# Patient Record
Sex: Male | Born: 1945 | Race: Black or African American | Hispanic: No | Marital: Married | Smoking: Former smoker
Health system: Southern US, Community
[De-identification: ages and names within clinical notes are randomized; demographics above are authoritative.]

## PROBLEM LIST (undated history)

## (undated) DIAGNOSIS — I10 Essential (primary) hypertension: Secondary | ICD-10-CM

## (undated) DIAGNOSIS — K625 Hemorrhage of anus and rectum: Secondary | ICD-10-CM

## (undated) DIAGNOSIS — J449 Chronic obstructive pulmonary disease, unspecified: Secondary | ICD-10-CM

## (undated) DIAGNOSIS — G4733 Obstructive sleep apnea (adult) (pediatric): Principal | ICD-10-CM

## (undated) DIAGNOSIS — Z860100 Personal history of colon polyps, unspecified: Secondary | ICD-10-CM

## (undated) DIAGNOSIS — E7800 Pure hypercholesterolemia, unspecified: Secondary | ICD-10-CM

## (undated) DIAGNOSIS — K219 Gastro-esophageal reflux disease without esophagitis: Secondary | ICD-10-CM

## (undated) DIAGNOSIS — G4730 Sleep apnea, unspecified: Secondary | ICD-10-CM

## (undated) DIAGNOSIS — R600 Localized edema: Secondary | ICD-10-CM

## (undated) DIAGNOSIS — I219 Acute myocardial infarction, unspecified: Secondary | ICD-10-CM

## (undated) DIAGNOSIS — E559 Vitamin D deficiency, unspecified: Secondary | ICD-10-CM

## (undated) DIAGNOSIS — K648 Other hemorrhoids: Secondary | ICD-10-CM

## (undated) DIAGNOSIS — I5030 Unspecified diastolic (congestive) heart failure: Secondary | ICD-10-CM

## (undated) DIAGNOSIS — M1990 Unspecified osteoarthritis, unspecified site: Secondary | ICD-10-CM

## (undated) HISTORY — DX: Obstructive sleep apnea (adult) (pediatric): G47.33

## (undated) HISTORY — DX: Pure hypercholesterolemia, unspecified: E78.00

## (undated) HISTORY — DX: Other hemorrhoids: K64.8

## (undated) HISTORY — PX: US ECHOCARDIOGRAPHY: HXRAD669

## (undated) HISTORY — DX: Essential (primary) hypertension: I10

## (undated) HISTORY — DX: Personal history of colonic polyps: Z86.010

## (undated) HISTORY — DX: Gastro-esophageal reflux disease without esophagitis: K21.9

## (undated) HISTORY — DX: Chronic obstructive pulmonary disease, unspecified: J44.9

## (undated) HISTORY — DX: Vitamin D deficiency, unspecified: E55.9

## (undated) HISTORY — DX: Personal history of colon polyps, unspecified: Z86.0100

## (undated) HISTORY — DX: Sleep apnea, unspecified: G47.30

## (undated) HISTORY — DX: Hemorrhage of anus and rectum: K62.5

## (undated) HISTORY — PX: TONSILLECTOMY: SUR1361

## (undated) HISTORY — DX: Localized edema: R60.0

## (undated) HISTORY — DX: Unspecified diastolic (congestive) heart failure: I50.30

## (undated) HISTORY — PX: COLONOSCOPY: SHX174

---

## 2001-08-18 ENCOUNTER — Emergency Department (HOSPITAL_COMMUNITY): Admission: EM | Admit: 2001-08-18 | Discharge: 2001-08-18 | Payer: Self-pay | Admitting: Emergency Medicine

## 2001-08-18 ENCOUNTER — Encounter: Payer: Self-pay | Admitting: Emergency Medicine

## 2002-03-09 DIAGNOSIS — I219 Acute myocardial infarction, unspecified: Secondary | ICD-10-CM

## 2002-03-09 HISTORY — DX: Acute myocardial infarction, unspecified: I21.9

## 2002-03-09 HISTORY — PX: CARDIAC CATHETERIZATION: SHX172

## 2002-05-17 ENCOUNTER — Encounter: Payer: Self-pay | Admitting: Pulmonary Disease

## 2002-05-17 ENCOUNTER — Ambulatory Visit (HOSPITAL_COMMUNITY): Admission: RE | Admit: 2002-05-17 | Discharge: 2002-05-17 | Payer: Self-pay | Admitting: Pulmonary Disease

## 2003-01-08 ENCOUNTER — Encounter (HOSPITAL_COMMUNITY): Admission: RE | Admit: 2003-01-08 | Discharge: 2003-04-08 | Payer: Self-pay | Admitting: Pulmonary Disease

## 2003-04-09 ENCOUNTER — Encounter (HOSPITAL_COMMUNITY): Admission: RE | Admit: 2003-04-09 | Discharge: 2003-04-16 | Payer: Self-pay | Admitting: Pulmonary Disease

## 2003-04-17 ENCOUNTER — Encounter (HOSPITAL_COMMUNITY): Admission: RE | Admit: 2003-04-17 | Discharge: 2003-07-16 | Payer: Self-pay | Admitting: Interventional Cardiology

## 2003-08-08 ENCOUNTER — Encounter (HOSPITAL_COMMUNITY): Admission: RE | Admit: 2003-08-08 | Discharge: 2003-11-06 | Payer: Self-pay | Admitting: Interventional Cardiology

## 2004-12-19 ENCOUNTER — Emergency Department (HOSPITAL_COMMUNITY): Admission: EM | Admit: 2004-12-19 | Discharge: 2004-12-20 | Payer: Self-pay | Admitting: Emergency Medicine

## 2004-12-24 ENCOUNTER — Ambulatory Visit (HOSPITAL_COMMUNITY): Admission: RE | Admit: 2004-12-24 | Discharge: 2004-12-24 | Payer: Self-pay | Admitting: Pulmonary Disease

## 2005-12-31 ENCOUNTER — Emergency Department (HOSPITAL_COMMUNITY): Admission: EM | Admit: 2005-12-31 | Discharge: 2005-12-31 | Payer: Self-pay | Admitting: Family Medicine

## 2007-12-30 ENCOUNTER — Ambulatory Visit (HOSPITAL_COMMUNITY): Admission: RE | Admit: 2007-12-30 | Discharge: 2007-12-30 | Payer: Self-pay | Admitting: Pulmonary Disease

## 2009-03-09 HISTORY — PX: CARDIOVASCULAR STRESS TEST: SHX262

## 2009-09-30 ENCOUNTER — Emergency Department (HOSPITAL_COMMUNITY): Admission: EM | Admit: 2009-09-30 | Discharge: 2009-09-30 | Payer: Self-pay | Admitting: Emergency Medicine

## 2009-10-01 ENCOUNTER — Emergency Department (HOSPITAL_COMMUNITY): Admission: EM | Admit: 2009-10-01 | Discharge: 2009-10-01 | Payer: Self-pay | Admitting: Emergency Medicine

## 2009-10-04 ENCOUNTER — Emergency Department (HOSPITAL_COMMUNITY): Admission: EM | Admit: 2009-10-04 | Discharge: 2009-10-04 | Payer: Self-pay | Admitting: Family Medicine

## 2009-10-09 ENCOUNTER — Emergency Department (HOSPITAL_COMMUNITY): Admission: EM | Admit: 2009-10-09 | Discharge: 2009-10-09 | Payer: Self-pay | Admitting: Family Medicine

## 2009-10-22 ENCOUNTER — Encounter (HOSPITAL_BASED_OUTPATIENT_CLINIC_OR_DEPARTMENT_OTHER): Admission: RE | Admit: 2009-10-22 | Discharge: 2009-11-07 | Payer: Self-pay | Admitting: General Surgery

## 2010-03-30 ENCOUNTER — Encounter: Payer: Self-pay | Admitting: Pulmonary Disease

## 2010-05-30 ENCOUNTER — Inpatient Hospital Stay (INDEPENDENT_AMBULATORY_CARE_PROVIDER_SITE_OTHER)
Admission: RE | Admit: 2010-05-30 | Discharge: 2010-05-30 | Disposition: A | Discharge status: Home / Self Care | Payer: 59 | Source: Ambulatory Visit | Attending: Family Medicine | Admitting: Family Medicine

## 2010-05-30 ENCOUNTER — Ambulatory Visit (INDEPENDENT_AMBULATORY_CARE_PROVIDER_SITE_OTHER): Payer: 59

## 2010-05-30 DIAGNOSIS — J40 Bronchitis, not specified as acute or chronic: Secondary | ICD-10-CM

## 2011-02-10 ENCOUNTER — Encounter (HOSPITAL_COMMUNITY): Payer: Self-pay | Admitting: Pharmacy Technician

## 2011-02-10 ENCOUNTER — Other Ambulatory Visit: Payer: Self-pay | Admitting: Otolaryngology

## 2011-02-13 ENCOUNTER — Encounter (HOSPITAL_COMMUNITY): Payer: Self-pay

## 2011-02-13 ENCOUNTER — Encounter (HOSPITAL_COMMUNITY)
Admission: RE | Admit: 2011-02-13 | Discharge: 2011-02-13 | Disposition: A | Discharge status: Home / Self Care | Payer: 59 | Source: Ambulatory Visit | Attending: Otolaryngology | Admitting: Otolaryngology

## 2011-02-13 ENCOUNTER — Ambulatory Visit (HOSPITAL_COMMUNITY)
Admission: RE | Admit: 2011-02-13 | Discharge: 2011-02-13 | Disposition: A | Discharge status: Home / Self Care | Payer: 59 | Source: Ambulatory Visit | Attending: Anesthesiology | Admitting: Anesthesiology

## 2011-02-13 ENCOUNTER — Other Ambulatory Visit: Payer: Self-pay

## 2011-02-13 DIAGNOSIS — Z01810 Encounter for preprocedural cardiovascular examination: Secondary | ICD-10-CM | POA: Insufficient documentation

## 2011-02-13 DIAGNOSIS — Z01818 Encounter for other preprocedural examination: Secondary | ICD-10-CM | POA: Insufficient documentation

## 2011-02-13 DIAGNOSIS — Z01812 Encounter for preprocedural laboratory examination: Secondary | ICD-10-CM | POA: Insufficient documentation

## 2011-02-13 HISTORY — DX: Unspecified osteoarthritis, unspecified site: M19.90

## 2011-02-13 HISTORY — DX: Essential (primary) hypertension: I10

## 2011-02-13 HISTORY — DX: Acute myocardial infarction, unspecified: I21.9

## 2011-02-13 LAB — BASIC METABOLIC PANEL
BUN: 16 mg/dL (ref 6–23)
CO2: 27 mEq/L (ref 19–32)
Calcium: 9.8 mg/dL (ref 8.4–10.5)
Chloride: 99 mEq/L (ref 96–112)
Creatinine, Ser: 0.81 mg/dL (ref 0.50–1.35)
GFR calc Af Amer: >90 mL/min (ref >90)
GFR calc non Af Amer: >90 mL/min (ref >90)
Glucose, Bld: 108 mg/dL — ABNORMAL HIGH (ref 70–99)
Potassium: 4.6 mEq/L (ref 3.5–5.1)
Sodium: 136 mEq/L (ref 135–145)

## 2011-02-13 LAB — CBC
HCT: 41.6 % (ref 39.0–52.0)
Hemoglobin: 14.7 g/dL (ref 13.0–17.0)
MCH: 31.7 pg (ref 26.0–34.0)
MCHC: 35.3 g/dL (ref 30.0–36.0)
MCV: 89.8 fL (ref 78.0–100.0)
Platelets: 231 10*3/uL (ref 150–400)
RBC: 4.63 MIL/uL (ref 4.22–5.81)
RDW: 13.6 % (ref 11.5–15.5)
WBC: 5.6 10*3/uL (ref 4.0–10.5)

## 2011-02-13 LAB — MRSA PCR SCREENING: MRSA by PCR: NEGATIVE

## 2011-02-13 NOTE — Progress Notes (Signed)
Pt sees Dr. Katrinka Blazing at [facility] cardiology annually, last stress test 2011, ECHO approximately 3 years ago. Requested records from office.

## 2011-02-13 NOTE — Pre-Procedure Instructions (Signed)
20 Richard [name]  02/13/2011   Your procedure is scheduled on:  December 11  Report to [facility] at 9:45 AM.  Call this number if you have problems the morning of surgery: [phone]   Remember:   Do not eat food:After Midnight.  May have clear liquids: up to 4 Hours before arrival.  Clear liquids include soda, tea, black coffee, apple or grape juice, broth.  Take these medicines the morning of surgery with A SIP OF WATER: Coreg, Singulair, nasal sprays, inhalers (bring albuterol to surgery)   Do not wear jewelry, make-up or nail polish.  Do not wear lotions, powders, or perfumes. You may wear deodorant.  Do not shave 48 hours prior to surgery.  Do not bring valuables to the hospital.  Contacts, dentures or bridgework may not be worn into surgery.  Leave suitcase in the car. After surgery it may be brought to your room.  For patients admitted to the hospital, checkout time is 11:00 AM the day of discharge.   Patients discharged the day of surgery will not be allowed to drive home.  Name and phone number of your driver: NA   Special Instructions: CHG Shower Use Special Wash: 1/2 bottle night before surgery and 1/2 bottle morning of surgery.   Please read over the following fact sheets that you were given: Pain Booklet, Coughing and Deep Breathing and Surgical Site Infection Prevention

## 2011-02-16 NOTE — Consult Note (Signed)
Anesthesia:  65 year old male for endoscopic sinus surgery for nasal polyps on 02/17/11.  His history includes CAD s/p ACS accompanied by vfib s/p CX stents X 2 in 2004, HTN, HLD, asthma, OA, and former smoker.  His Cardiologist is Dr. Katrinka Blazing who did clear him for this procedure.  His last stress test was on 05/22/09 showing small fixed inferior-basal defect.  EF 60%.  This was not felt significantly changed from his 2006 study.  He has been asymptomatic.  His preoperative EKG was noted.  RSR prime is new.  Labs and CXR noted.  Plan to proceed.

## 2011-02-17 ENCOUNTER — Encounter (HOSPITAL_COMMUNITY): Payer: Self-pay | Admitting: *Deleted

## 2011-02-17 ENCOUNTER — Encounter (HOSPITAL_COMMUNITY): Payer: Self-pay | Admitting: Vascular Surgery

## 2011-02-17 ENCOUNTER — Other Ambulatory Visit: Payer: Self-pay | Admitting: Otolaryngology

## 2011-02-17 ENCOUNTER — Ambulatory Visit (HOSPITAL_COMMUNITY)
Admission: RE | Admit: 2011-02-17 | Discharge: 2011-02-17 | Disposition: A | Discharge status: Home / Self Care | Payer: 59 | Source: Ambulatory Visit | Attending: Otolaryngology | Admitting: Otolaryngology

## 2011-02-17 ENCOUNTER — Ambulatory Visit (HOSPITAL_COMMUNITY): Payer: 59 | Admitting: Vascular Surgery

## 2011-02-17 ENCOUNTER — Encounter (HOSPITAL_COMMUNITY)
Admission: RE | Disposition: A | Discharge status: Home / Self Care | Payer: Self-pay | Source: Ambulatory Visit | Attending: Otolaryngology

## 2011-02-17 DIAGNOSIS — I10 Essential (primary) hypertension: Secondary | ICD-10-CM | POA: Insufficient documentation

## 2011-02-17 DIAGNOSIS — I2510 Atherosclerotic heart disease of native coronary artery without angina pectoris: Secondary | ICD-10-CM | POA: Insufficient documentation

## 2011-02-17 DIAGNOSIS — J329 Chronic sinusitis, unspecified: Secondary | ICD-10-CM | POA: Insufficient documentation

## 2011-02-17 DIAGNOSIS — J45909 Unspecified asthma, uncomplicated: Secondary | ICD-10-CM | POA: Insufficient documentation

## 2011-02-17 DIAGNOSIS — J338 Other polyp of sinus: Secondary | ICD-10-CM | POA: Insufficient documentation

## 2011-02-17 DIAGNOSIS — I252 Old myocardial infarction: Secondary | ICD-10-CM | POA: Insufficient documentation

## 2011-02-17 DIAGNOSIS — J331 Polypoid sinus degeneration: Secondary | ICD-10-CM

## 2011-02-17 HISTORY — PX: POLYPECTOMY: SHX149

## 2011-02-17 HISTORY — DX: Polypoid sinus degeneration: J33.1

## 2011-02-17 HISTORY — PX: SINUS ENDO W/FUSION: SHX777

## 2011-02-17 SURGERY — SINUS SURGERY, ENDOSCOPIC, USING COMPUTER-ASSISTED NAVIGATION
Anesthesia: General | Wound class: Clean Contaminated

## 2011-02-17 MED ORDER — MEPERIDINE HCL 25 MG/ML IJ SOLN: 6.2500 mg | INTRAMUSCULAR | Status: DC | PRN

## 2011-02-17 MED ORDER — SODIUM CHLORIDE 0.9 % IR SOLN
Status: DC | PRN
  Administered 2011-02-17: 1000 mL

## 2011-02-17 MED ORDER — ROCURONIUM BROMIDE 100 MG/10ML IV SOLN
INTRAVENOUS | Status: DC | PRN
  Administered 2011-02-17: 50 mg via INTRAVENOUS

## 2011-02-17 MED ORDER — OXYMETAZOLINE HCL 0.05 % NA SOLN
NASAL | Status: DC | PRN
  Administered 2011-02-17: 1

## 2011-02-17 MED ORDER — HYDROCODONE-ACETAMINOPHEN 5-325 MG PO TABS: 1.0000 | ORAL_TABLET | Freq: Four times a day (QID) | ORAL | Status: AC | PRN

## 2011-02-17 MED ORDER — ONDANSETRON HCL 4 MG/2ML IJ SOLN
INTRAMUSCULAR | Status: DC | PRN
  Administered 2011-02-17: 4 mg via INTRAVENOUS

## 2011-02-17 MED ORDER — HYDROMORPHONE HCL PF 1 MG/ML IJ SOLN: 0.2500 mg | INTRAMUSCULAR | Status: DC | PRN

## 2011-02-17 MED ORDER — CEFAZOLIN SODIUM 1-5 GM-% IV SOLN
1.0000 g | Freq: Once | INTRAVENOUS | Status: AC
  Administered 2011-02-17: 1 g via INTRAVENOUS

## 2011-02-17 MED ORDER — DEXAMETHASONE SODIUM PHOSPHATE 4 MG/ML IJ SOLN: 10.0000 mg | Freq: Once | INTRAMUSCULAR | Status: DC

## 2011-02-17 MED ORDER — DEXAMETHASONE SODIUM PHOSPHATE 4 MG/ML IJ SOLN
INTRAMUSCULAR | Status: DC | PRN
  Administered 2011-02-17: 10 mg via INTRAVENOUS

## 2011-02-17 MED ORDER — FENTANYL CITRATE 0.05 MG/ML IJ SOLN
INTRAMUSCULAR | Status: DC | PRN
  Administered 2011-02-17: 50 ug via INTRAVENOUS
  Administered 2011-02-17: 100 ug via INTRAVENOUS

## 2011-02-17 MED ORDER — LIDOCAINE-EPINEPHRINE 1 %-1:100000 IJ SOLN
INTRAMUSCULAR | Status: DC | PRN
  Administered 2011-02-17: 6 mL

## 2011-02-17 MED ORDER — AMOXICILLIN-POT CLAVULANATE 500-125 MG PO TABS: 1.0000 | ORAL_TABLET | Freq: Two times a day (BID) | ORAL | Status: AC

## 2011-02-17 MED ORDER — PHENYLEPHRINE HCL 10 MG/ML IJ SOLN
10.0000 mg | INTRAVENOUS | Status: DC | PRN
  Administered 2011-02-17: 20 ug/min via INTRAVENOUS

## 2011-02-17 MED ORDER — PROPOFOL 10 MG/ML IV EMUL
INTRAVENOUS | Status: DC | PRN
  Administered 2011-02-17: 130 mg via INTRAVENOUS

## 2011-02-17 MED ORDER — LIDOCAINE HCL (CARDIAC) 20 MG/ML IV SOLN
INTRAVENOUS | Status: DC | PRN
  Administered 2011-02-17: 50 mg via INTRAVENOUS

## 2011-02-17 MED ORDER — LACTATED RINGERS IV SOLN
INTRAVENOUS | Status: DC | PRN
  Administered 2011-02-17 (×2): via INTRAVENOUS

## 2011-02-17 MED ORDER — GLYCOPYRROLATE 0.2 MG/ML IJ SOLN
INTRAMUSCULAR | Status: DC | PRN
  Administered 2011-02-17: .8 mg via INTRAVENOUS

## 2011-02-17 MED ORDER — NEOSTIGMINE METHYLSULFATE 1 MG/ML IJ SOLN
INTRAMUSCULAR | Status: DC | PRN
  Administered 2011-02-17: 4 mg via INTRAVENOUS

## 2011-02-17 MED ORDER — MUPIROCIN CALCIUM 2 % EX CREA
TOPICAL_CREAM | CUTANEOUS | Status: DC | PRN
  Administered 2011-02-17: 1 via TOPICAL

## 2011-02-17 MED ORDER — TRIAMCINOLONE ACETONIDE 40 MG/ML IJ SUSP
INTRAMUSCULAR | Status: DC | PRN
  Administered 2011-02-17: 200 mg

## 2011-02-17 MED ORDER — PROMETHAZINE HCL 25 MG/ML IJ SOLN: 6.2500 mg | INTRAMUSCULAR | Status: DC | PRN

## 2011-02-17 SURGICAL SUPPLY — 36 items
BLADE RAD40 ROTATE 4M 4 5PK (BLADE) IMPLANT
BLADE RAD60 ROTATE M4 4 5PK (BLADE) IMPLANT
BLADE TRICUT ROTATE M4 4 5PK (BLADE) ×2 IMPLANT
CANISTER SUCTION 2500CC (MISCELLANEOUS) ×4 IMPLANT
CLOTH BEACON ORANGE TIMEOUT ST (SAFETY) ×2 IMPLANT
COAGULATOR SUCT SWTCH 10FR 6 (ELECTROSURGICAL) ×1 IMPLANT
DRESSING NASAL KENNEDY 3.5X.9 (MISCELLANEOUS) IMPLANT
DRESSING TELFA 8X3 (GAUZE/BANDAGES/DRESSINGS) ×1 IMPLANT
DRSG NASAL KENNEDY 3.5X.9 (MISCELLANEOUS)
ELECT REM PT RETURN 9FT ADLT (ELECTROSURGICAL) ×2
ELECTRODE REM PT RTRN 9FT ADLT (ELECTROSURGICAL) IMPLANT
FILTER ARTHROSCOPY CONVERTOR (FILTER) ×4 IMPLANT
GLOVE BIOGEL M 7.0 STRL (GLOVE) ×4 IMPLANT
GLOVE ECLIPSE 6.5 STRL STRAW (GLOVE) ×1 IMPLANT
GOWN STRL NON-REIN LRG LVL3 (GOWN DISPOSABLE) ×4 IMPLANT
KENNEDY SLIM-PROFILE SINUS PACK ×1 IMPLANT
KIT BASIN OR (CUSTOM PROCEDURE TRAY) ×2 IMPLANT
KIT ROOM TURNOVER OR (KITS) ×2 IMPLANT
MARKER SPHERE PSV REFLC NDI (MISCELLANEOUS) ×4 IMPLANT
NS IRRIG 1000ML POUR BTL (IV SOLUTION) ×2 IMPLANT
PAD ARMBOARD 7.5X6 YLW CONV (MISCELLANEOUS) ×4 IMPLANT
PATTIES SURGICAL .5 X3 (DISPOSABLE) ×2 IMPLANT
SPECIMEN JAR SMALL (MISCELLANEOUS) ×4 IMPLANT
SPONGE NEURO XRAY DETECT 1X3 (DISPOSABLE) ×1 IMPLANT
SUT CHROMIC 4 0 G 3 (SUTURE) ×1 IMPLANT
SUT ETHILON 3 0 PS 1 (SUTURE) ×1 IMPLANT
SUT PLAIN 4 0 ~~LOC~~ 1 (SUTURE) ×1 IMPLANT
SWAB COLLECTION DEVICE MRSA (MISCELLANEOUS) IMPLANT
SYR 50ML SLIP (SYRINGE) IMPLANT
TOWEL OR 17X24 6PK STRL BLUE (TOWEL DISPOSABLE) ×2 IMPLANT
TOWEL OR 17X26 10 PK STRL BLUE (TOWEL DISPOSABLE) ×2 IMPLANT
TOWEL OR NON WOVEN STRL DISP B (DISPOSABLE) ×2 IMPLANT
TRAY ENT MC OR (CUSTOM PROCEDURE TRAY) ×2 IMPLANT
TUBE ANAEROBIC SPECIMEN COL (MISCELLANEOUS) IMPLANT
TUBE CONNECTING 12X1/4 (SUCTIONS) ×2 IMPLANT
WATER STERILE IRR 1000ML POUR (IV SOLUTION) ×1 IMPLANT

## 2011-02-17 NOTE — Anesthesia Postprocedure Evaluation (Signed)
  Anesthesia Post-op Note  Patient: Richard [name]  Procedure(s) Performed:  ENDOSCOPIC SINUS SURGERY WITH FUSION NAVIGATION; POLYPECTOMY NASAL  Patient Location: PACU  Anesthesia Type: General  Level of Consciousness: awake, alert  and oriented  Airway and Oxygen Therapy: Patient Spontanous Breathing  Post-op Pain: mild  Post-op Assessment: Post-op Vital signs reviewed, Patient's Cardiovascular Status Stable, Respiratory Function Stable and Patent Airway  Post-op Vital Signs: Reviewed and stable  Complications: No apparent anesthesia complications

## 2011-02-17 NOTE — Anesthesia Preprocedure Evaluation (Addendum)
Anesthesia Evaluation  Patient identified by MRN, date of birth, ID band Patient awake    Reviewed: Allergy & Precautions, H&P , NPO status , Patient's Chart, lab work & pertinent test results, reviewed documented beta blocker date and time   Airway Mallampati: II TM Distance: >3 FB Neck ROM: Full    Dental  (+) Teeth Intact and Dental Advisory Given   Pulmonary asthma ,  clear to auscultation        Cardiovascular hypertension, Pt. on home beta blockers and Pt. on medications + CAD, + Past MI and + Cardiac Stents Regular Normal    Neuro/Psych    GI/Hepatic negative GI ROS, Neg liver ROS,   Endo/Other  Negative Endocrine ROS  Renal/GU negative Renal ROS     Musculoskeletal negative musculoskeletal ROS (+)   Abdominal   Peds  Hematology negative hematology ROS (+)   Anesthesia Other Findings   Reproductive/Obstetrics                          Anesthesia Physical Anesthesia Plan  ASA: III  Anesthesia Plan: General   Post-op Pain Management:    Induction: Intravenous  Airway Management Planned: Oral ETT  Additional Equipment:   Intra-op Plan:   Post-operative Plan: Extubation in OR  Informed Consent: I have reviewed the patients History and Physical, chart, labs and discussed the procedure including the risks, benefits and alternatives for the proposed anesthesia with the patient or authorized representative who has indicated his/her understanding and acceptance.   Dental advisory given  Plan Discussed with: CRNA  Anesthesia Plan Comments:        Anesthesia Quick Evaluation

## 2011-02-17 NOTE — Transfer of Care (Signed)
Immediate Anesthesia Transfer of Care Note  Patient: Richard [name]  Procedure(s) Performed:  ENDOSCOPIC SINUS SURGERY WITH FUSION NAVIGATION; POLYPECTOMY NASAL  Patient Location: PACU  Anesthesia Type: General  Level of Consciousness: alert  and oriented  Airway & Oxygen Therapy: Patient connected to face mask oxygen  Post-op Assessment: Report given to PACU RN and Post -op Vital signs reviewed and stable  Post vital signs: stable  Complications: No apparent anesthesia complications

## 2011-02-17 NOTE — Anesthesia Procedure Notes (Addendum)
Procedure Name: Intubation Date/Time: 02/17/2011 12:08 PM Performed by: Margaree Mackintosh Pre-anesthesia Checklist: Patient identified, Emergency Drugs available, Suction available, Patient being monitored and Timeout performed Patient Re-evaluated:Patient Re-evaluated prior to inductionOxygen Delivery Method: Circle System Utilized Preoxygenation: Pre-oxygenation with 100% oxygen Intubation Type: IV induction Ventilation: Mask ventilation without difficulty and Oral airway inserted - appropriate to patient size   Date/Time: 02/17/2011 12:12 PM Performed by: Margaree Mackintosh Laryngoscope Size: Mac and 3 Grade View: Grade II Tube type: Oral Tube size: 8.0 mm Number of attempts: 1 Airway Equipment and Method: stylet Placement Confirmation: ETT inserted through vocal cords under direct vision,  positive ETCO2 and breath sounds checked- equal and bilateral Secured at: 22 cm Tube secured with: Tape Dental Injury: Teeth and Oropharynx as per pre-operative assessment

## 2011-02-17 NOTE — H&P (Signed)
History and Physical Interval Note:  02/17/2011 11:48 AM  See Office H&P dated 02/10/11, note in pt's chart   Richard [name]  has presented today for surgery, with the diagnosis of Chronic Sinusitis  The various methods of treatment have been discussed with the patient and family. After consideration of risks, benefits and other options for treatment, the patient has consented to  Procedure(s): ENDOSCOPIC SINUS SURGERY WITH FUSION NAVIGATION POLYPECTOMY NASAL as a surgical intervention .  The patients' history has been reviewed, patient examined, no change in status, stable for surgery.  I have reviewed the patients' chart and labs.  Questions were answered to the patient's satisfaction.     Norris Bodley L

## 2011-02-17 NOTE — Brief Op Note (Signed)
02/17/2011  2:27 PM  PATIENT:  Richard [name]  65 y.o. male  PRE-OPERATIVE DIAGNOSIS:  Chronic Sinusitis  POST-OPERATIVE DIAGNOSIS:  Chronic Sinusitis  PROCEDURE:  Procedure(s): ENDOSCOPIC SINUS SURGERY WITH FUSION NAVIGATION POLYPECTOMY NASAL  SURGEON:  Surgeon(s): Barbee Cough  PHYSICIAN ASSISTANT:   ASSISTANTS: none   ANESTHESIA:   general  EBL:  Total I/O In: 1000 [I.V.:1000] Out: 200 [Blood:200]  BLOOD ADMINISTERED:none  DRAINS: none   LOCAL MEDICATIONS USED:  LIDOCAINE 9CC  SPECIMEN:  Source of Specimen:  sinus contents  DISPOSITION OF SPECIMEN:  PATHOLOGY  COUNTS:  YES  TOURNIQUET:  * No tourniquets in log *  DICTATION: .Other Dictation: Dictation Number [id number]  PLAN OF CARE: Admit for overnight observation  PATIENT DISPOSITION:  PACU - hemodynamically stable.   Delay start of Pharmacological VTE agent (>24hrs) due to surgical blood loss or risk of bleeding:  {YES/NO/NOT APPLICABLE:20182

## 2011-02-17 NOTE — Progress Notes (Signed)
Day of Surgery  Subjective: Post sinus surgery.  No pain.  Little bleeding.  Tolerating diet.  No cardiac symptoms.  Objective: Vital signs in last 24 hours: Temp:  [97.4 F (36.3 C)-99 F (37.2 C)] 99 F (37.2 C) (12/11 1556) Pulse Rate:  [67-76] 75  (12/11 1556) Resp:  [10-18] 16  (12/11 1556) BP: (120-142)/(60-92) 142/92 mmHg (12/11 1556) SpO2:  [97 %-100 %] 97 % (12/11 1556)    Intake/Output from previous day:   Intake/Output this shift: Total I/O In: 1400 [I.V.:1400] Out: 200 [Blood:200]  General appearance: alert, cooperative and no distress Nose: Nasal dressing in place with no active bleeding.  Lab Results:  No results found for this basename: WBC:2,HGB:2,HCT:2,PLT:2 in the last 72 hours BMET No results found for this basename: NA:2,K:2,CL:2,CO2:2,GLUCOSE:2,BUN:2,CREATININE:2,CALCIUM:2 in the last 72 hours PT/INR No results found for this basename: LABPROT:2,INR:2 in the last 72 hours ABG No results found for this basename: PHART:2,PCO2:2,PO2:2,HCO3:2 in the last 72 hours  Studies/Results: No results found.  Anti-infectives: Anti-infectives     Start     Dose/Rate Route Frequency Ordered Stop   02/17/11 1200   ceFAZolin (ANCEF) IVPB 1 g/50 mL premix        1 g 100 mL/hr over 30 Minutes Intravenous  Once 02/17/11 1154 02/17/11 1214   02/17/11 0000   amoxicillin-clavulanate (AUGMENTIN) 500-125 MG per tablet        1 tablet Oral 2 times daily 02/17/11 1453 02/27/11 2359          Assessment/Plan: s/p Procedure(s): ENDOSCOPIC SINUS SURGERY WITH FUSION NAVIGATION POLYPECTOMY NASAL Discharge  LOS: 0 days    Ermelinda Eckert D 02/17/2011

## 2011-02-17 NOTE — Preoperative (Signed)
Beta Blockers   Reason not to administer Beta Blockers:Not Applicable. Coreg 0830 02/17/11

## 2011-02-18 NOTE — Op Note (Signed)
NAME:  Richard [name], Richard [name] NO.:  [id number]  MEDICAL RECORD NO.:  [id number]  LOCATION:  [facility]                         FACILITY:  [facility]  PHYSICIAN:  Kinnie Scales. Annalee Genta, M.D.DATE OF BIRTH:  10/02/1945  DATE OF PROCEDURE:  02/17/2011 DATE OF DISCHARGE:  02/17/2011                              OPERATIVE REPORT   PREOPERATIVE DIAGNOSES: 1. Chronic sinusitis. 2. 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  POSTOPERATIVE DIAGNOSES: 1. Chronic sinusitis. 2. 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  INDICATION FOR SURGERY: 1. Chronic sinusitis. 2. 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  SURGICAL PROCEDURE:  Bilateral endoscopic sinus surgery with nasal polypectomy, and intraoperative computer-assisted navigation (Stealth) consisting of bilateral total ethmoidectomy, bilateral maxillary antrostomy with removal of diseased tissue, bilateral nasal frontal recess and exploration, right sphenoidotomy with removal of diseased tissue.  ANESTHESIA:  General endotracheal.  SURGEON:  Kinnie Scales. Annalee Genta, MD  COMPLICATIONS:  None.  ESTIMATED BLOOD LOSS:  Approximately 200 mL.  The patient was transferred from the operating room to the recovery room in stable condition.  FINDINGS:  Massive bilateral nasal polyposis, deviated nasal septum not impinging on sinus cavities and left untreated, polyps resected, bilateral Kenalog/Bactroban slurry instilled.  Bilateral Kennedy sinus packs placed.  BRIEF HISTORY:  The patient is a 65 year old black male, referred to our office with history of progressive symptoms of nasal airway obstruction. No significant prior nasal surgery.  Examination in the office revealed polypoid disease and complete bilateral nasal airway obstruction.  He has a history of recurrent sinusitis, chronic postnasal discharge, and sinus obstruction.  The patient was treated with antibiotics and oral and topical steroids.   A CT scan of the sinuses was obtained, and the patient had complete opacification of the frontal, maxillary, ethmoid and sphenoid sinuses with complete obstruction of the nasal airway.  He also was found to have a deviated nasal septum.  Given the patient's history, examination, and physical findings, with failure to respond to appropriate medical therapy, I recommended that we consider him for bilateral endoscopic sinus surgery and nasal polypectomy.  The risks, benefits, and possible complications of the procedures were discussed in detail with the patient who understood and concurred with our plan for surgery.  Prior to surgery, a Stealth formatted CT scan of the sinus was performed for use during surgery for intraoperative computer-assisted navigation.  The patient was cleared by his cardiologist, Dr. Verdis Prime from a cardiac standpoint.  The surgery was scheduled at [facility].  PROCEDURE:  The patient was brought to the operating room on February 17, 2011, and placed in supine position on the operating table.  General endotracheal anesthesia was established without difficulty.  When the patient was adequately anesthetized, his nose was injected with a total of 9 mL of 1% lidocaine and 1:100, 000 dilution epinephrine.  The patient's nose was then packed with Afrin-soaked cotton pledgets, which were left in place for approximately 10 minutes.  The patient's Stealth navigation head gear was applied and anatomic and surgical landmarks were identified and confirmed.  The Stealth device was used throughout the surgical procedure.  He was then positioned on the operating table, and prepped and draped in a  sterile fashion, and the surgical procedure was begun.  Began on the left-hand side, the patient's nasal cavity was inspected. He had massive nasal polyposis with complete obstruction of the nasal airway.  Using a 0 degree telescope and a straight microdebrider,  polyps were resected from within the nasal cavity to the level of the middle meatus.  There was significant posterior extension of polypoid disease with obstruction of the nasopharynx.  This was completely cleared of polyps.  The middle meatus was then inspected and dissection was carried out through the anterior ethmoid region using a 0 degree telescope, microdebrider and the Stealth navigation tool.  Dissection was then carried from anterior to posterior removing bony septations and heavy polypoid disease within the entire ethmoid sinus.  The posterior ethmoid region was identified.  All polyps were cleared.  The anterior rostrum of the sphenoid sinus was identified.  Sphenoid ostium was entered and polypoid disease within the sphenoid sinus was resected.  Attention was then turned to the lateral nasal wall.  Using a 45-degree telescope and a curved microdebrider, the uncinate process was resected and the natural os of the maxillary sinus was identified, this was completely occluded with polyps, which were resected using a curved microdebrider and creating a widely patent maxillary sinus.  Attention was then returned to the roof of the ethmoid sinus.  Dissection was carried from posterior to anterior.  The nasal frontal recess was identified and cleared of polypoid disease.  Dissection was then carried up into the frontal sinus to create a widely patent nasal frontal recess.  Attention was then turned to the patient's right-hand side.  He was found to have a large inferior bony septal spur, which was occluding the nasal airway to a degree but did not impinge on the sinuses and was left untreated.  Using a 0 degree telescope, polypoid disease within the nasal cavity middle meatus was resected with a straight microdebrider. The middle turbinate was carefully medialized and resection was then carried out throughout the ethmoid sinus from anterior to posterior beginning with the ethmoid  bulla and dissecting posteriorly through the floor of the ethmoid region resecting polypoid disease to the level of the sphenoid sinus.  Dissection was then carried along the roof of the ethmoid sinus from anterior to posterior along the lamina papyracea, taking care to avoid the orbit.  Navigation was used throughout this portion of the surgical procedure.  Attention was then turned to the lateral nasal wall where the uncinate process was completely resected, natural os of the maxillary sinus identified and enlarged posteriorly. Polyps from within the right maxillary sinus were then resected with a curved microdebrider.  Attention was then returned to the roof of the ethmoid sinus.  Dissection was carried from posterior to anterior.  The nasal frontal recess was identified and obstructing polypoid disease was resected creating a widely patent nasal frontal recess.  The middle turbinate was destabilized by the surgical resection and given those findings, I opted to resect the inferior-posterior aspect of the middle turbinate, leaving the superior attachment and placed as an anatomic landmark.  The patient's nasal cavity was irrigated and suctioned.  Surgical debris cleared.  A Kenalog slurry consisting of Kenalog 40 and Bactroban was then instilled in the frontal ethmoid and maxillary sinuses bilaterally. The sinuses were packed with a small Kennedy sinus pack bilaterally which was hydrated with sterile saline.  Oropharynx was irrigated and suctioned.  Orogastric tube was passed.  Stomach contents were aspirated.  The patient was  then awakened from his anesthetic, extubated, and transferred from the operating room to the recovery room in stable condition.  No complications.  Estimated blood loss approximately 200 mL.          ______________________________ Kinnie Scales. Annalee Genta, M.D.     DLS/MEDQ  D:  16/12/9602  T:  02/18/2011  Job:  [id number]

## 2011-02-19 ENCOUNTER — Encounter (HOSPITAL_COMMUNITY): Payer: Self-pay | Admitting: Otolaryngology

## 2013-04-25 ENCOUNTER — Emergency Department (HOSPITAL_COMMUNITY)
Admission: EM | Admit: 2013-04-25 | Discharge: 2013-04-25 | Disposition: A | Discharge status: Nursing Home (Includes ICF) | Payer: 59 | Source: Home / Self Care | Attending: Emergency Medicine | Admitting: Emergency Medicine

## 2013-04-25 ENCOUNTER — Encounter (HOSPITAL_COMMUNITY): Payer: Self-pay | Admitting: Emergency Medicine

## 2013-04-25 ENCOUNTER — Emergency Department (HOSPITAL_COMMUNITY)
Admission: EM | Admit: 2013-04-25 | Discharge: 2013-04-26 | Disposition: A | Discharge status: Home / Self Care | Payer: 59 | Attending: Emergency Medicine | Admitting: Emergency Medicine

## 2013-04-25 ENCOUNTER — Emergency Department (INDEPENDENT_AMBULATORY_CARE_PROVIDER_SITE_OTHER): Payer: 59

## 2013-04-25 ENCOUNTER — Emergency Department (HOSPITAL_COMMUNITY): Payer: 59

## 2013-04-25 DIAGNOSIS — I10 Essential (primary) hypertension: Secondary | ICD-10-CM | POA: Insufficient documentation

## 2013-04-25 DIAGNOSIS — R0682 Tachypnea, not elsewhere classified: Secondary | ICD-10-CM | POA: Insufficient documentation

## 2013-04-25 DIAGNOSIS — R0989 Other specified symptoms and signs involving the circulatory and respiratory systems: Secondary | ICD-10-CM | POA: Insufficient documentation

## 2013-04-25 DIAGNOSIS — R0600 Dyspnea, unspecified: Secondary | ICD-10-CM

## 2013-04-25 DIAGNOSIS — Z87891 Personal history of nicotine dependence: Secondary | ICD-10-CM | POA: Insufficient documentation

## 2013-04-25 DIAGNOSIS — E7800 Pure hypercholesterolemia, unspecified: Secondary | ICD-10-CM | POA: Insufficient documentation

## 2013-04-25 DIAGNOSIS — Z7982 Long term (current) use of aspirin: Secondary | ICD-10-CM | POA: Insufficient documentation

## 2013-04-25 DIAGNOSIS — M129 Arthropathy, unspecified: Secondary | ICD-10-CM | POA: Insufficient documentation

## 2013-04-25 DIAGNOSIS — J45901 Unspecified asthma with (acute) exacerbation: Secondary | ICD-10-CM | POA: Insufficient documentation

## 2013-04-25 DIAGNOSIS — Z79899 Other long term (current) drug therapy: Secondary | ICD-10-CM | POA: Insufficient documentation

## 2013-04-25 DIAGNOSIS — R05 Cough: Secondary | ICD-10-CM | POA: Insufficient documentation

## 2013-04-25 DIAGNOSIS — R059 Cough, unspecified: Secondary | ICD-10-CM | POA: Insufficient documentation

## 2013-04-25 DIAGNOSIS — R0609 Other forms of dyspnea: Secondary | ICD-10-CM | POA: Insufficient documentation

## 2013-04-25 DIAGNOSIS — J9801 Acute bronchospasm: Secondary | ICD-10-CM

## 2013-04-25 DIAGNOSIS — R0601 Orthopnea: Secondary | ICD-10-CM

## 2013-04-25 DIAGNOSIS — I252 Old myocardial infarction: Secondary | ICD-10-CM | POA: Insufficient documentation

## 2013-04-25 HISTORY — DX: Pure hypercholesterolemia, unspecified: E78.00

## 2013-04-25 LAB — BASIC METABOLIC PANEL
BUN: 19 mg/dL (ref 6–23)
CO2: 26 mEq/L (ref 19–32)
Calcium: 9.4 mg/dL (ref 8.4–10.5)
Chloride: 99 mEq/L (ref 96–112)
Creatinine, Ser: 1.15 mg/dL (ref 0.50–1.35)
GFR calc Af Amer: 74 mL/min — ABNORMAL LOW (ref >90)
GFR calc non Af Amer: 64 mL/min — ABNORMAL LOW (ref >90)
Glucose, Bld: 89 mg/dL (ref 70–99)
Potassium: 4.6 mEq/L (ref 3.7–5.3)
Sodium: 139 mEq/L (ref 137–147)

## 2013-04-25 LAB — CBC
HCT: 40.7 % (ref 39.0–52.0)
Hemoglobin: 14.3 g/dL (ref 13.0–17.0)
MCH: 31.5 pg (ref 26.0–34.0)
MCHC: 35.1 g/dL (ref 30.0–36.0)
MCV: 89.6 fL (ref 78.0–100.0)
Platelets: 252 10*3/uL (ref 150–400)
RBC: 4.54 MIL/uL (ref 4.22–5.81)
RDW: 14.1 % (ref 11.5–15.5)
WBC: 8.3 10*3/uL (ref 4.0–10.5)

## 2013-04-25 LAB — POCT I-STAT TROPONIN I: Troponin i, poc: 0 ng/mL (ref 0.00–0.08)

## 2013-04-25 LAB — PRO B NATRIURETIC PEPTIDE: Pro B Natriuretic peptide (BNP): 38.8 pg/mL (ref 0–125)

## 2013-04-25 MED ORDER — PREDNISONE 20 MG PO TABS
60.0000 mg | ORAL_TABLET | Freq: Once | ORAL | Status: AC
  Administered 2013-04-25: 60 mg via ORAL
  Filled 2013-04-25: qty 3

## 2013-04-25 MED ORDER — ALBUTEROL SULFATE (2.5 MG/3ML) 0.083% IN NEBU
5.0000 mg | INHALATION_SOLUTION | Freq: Once | RESPIRATORY_TRACT | Status: AC
  Administered 2013-04-25: 5 mg via RESPIRATORY_TRACT
  Filled 2013-04-25: qty 6

## 2013-04-25 MED ORDER — ALBUTEROL SULFATE (2.5 MG/3ML) 0.083% IN NEBU
5.0000 mg | INHALATION_SOLUTION | Freq: Once | RESPIRATORY_TRACT | Status: AC
  Administered 2013-04-25: 5 mg via RESPIRATORY_TRACT

## 2013-04-25 MED ORDER — IPRATROPIUM BROMIDE 0.02 % IN SOLN
0.5000 mg | Freq: Once | RESPIRATORY_TRACT | Status: AC
  Administered 2013-04-25: 0.5 mg via RESPIRATORY_TRACT
  Filled 2013-04-25: qty 2.5

## 2013-04-25 MED ORDER — ALBUTEROL SULFATE (2.5 MG/3ML) 0.083% IN NEBU
INHALATION_SOLUTION | RESPIRATORY_TRACT | Status: AC
  Filled 2013-04-25: qty 6

## 2013-04-25 MED ORDER — IPRATROPIUM BROMIDE 0.02 % IN SOLN
0.5000 mg | Freq: Once | RESPIRATORY_TRACT | Status: AC
  Administered 2013-04-25: 0.5 mg via RESPIRATORY_TRACT

## 2013-04-25 MED ORDER — ALBUTEROL (5 MG/ML) CONTINUOUS INHALATION SOLN: 10.0000 mg/h | INHALATION_SOLUTION | Freq: Once | RESPIRATORY_TRACT | Status: DC

## 2013-04-25 MED ORDER — ALBUTEROL (5 MG/ML) CONTINUOUS INHALATION SOLN
10.0000 mg/h | INHALATION_SOLUTION | Freq: Once | RESPIRATORY_TRACT | Status: AC
  Administered 2013-04-26: 10 mg/h via RESPIRATORY_TRACT

## 2013-04-25 MED ORDER — ALBUTEROL (5 MG/ML) CONTINUOUS INHALATION SOLN
10.0000 mg/h | INHALATION_SOLUTION | Freq: Once | RESPIRATORY_TRACT | Status: AC
Start: 2013-04-25 — End: 2013-04-25
  Administered 2013-04-25: 10 mg/h via RESPIRATORY_TRACT
  Filled 2013-04-25: qty 20

## 2013-04-25 MED ORDER — IPRATROPIUM BROMIDE 0.02 % IN SOLN
RESPIRATORY_TRACT | Status: AC
  Filled 2013-04-25: qty 2.5

## 2013-04-25 NOTE — ED Notes (Signed)
MD at bedside. 

## 2013-04-25 NOTE — ED Notes (Addendum)
Pt sent here form urgent care for further eval of SOB more with exertion and lying down to sleep. Pt hx of asthma has some wheezing throughout. Pt complaining of congestion and chest x ray and EKG done at [facility]. Breathing treatment also given at [facility].

## 2013-04-25 NOTE — ED Provider Notes (Signed)
CSN: [id number]     Arrival date & time 04/25/13  1846 History   First MD Initiated Contact with Patient 04/25/13 1901     Chief Complaint  Patient presents with  . Shortness of Breath     (Consider location/radiation/quality/duration/timing/severity/associated sxs/prior Treatment) HPI Comments: Patient reports 2 weeks of progressive orthopnea (must now sleep sitting up) and exertional dyspnea without reports of chest pain. States while he does have a history of asthma, he has been using his rescue inhaler (albuterol) since symptoms began and it has done little to improve work of breathing. Hx of CAD with stent to LCX in 2004 (Dr. Linard Millers). Denies fever pr pedal edema. Endorses 2 week hx of cough and nasal congestion  Patient is a 68 y.o. male presenting with shortness of breath. The history is provided by the patient.  Shortness of Breath Associated symptoms: cough and wheezing   Associated symptoms: no chest pain, no diaphoresis and no fever     Past Medical History  Diagnosis Date  . Myocardial infarction 2004    Dr. Tamala Julian, at [facility], yearly visits  . Asthma   . Arthritis   . Hypertension   . High cholesterol    Past Surgical History  Procedure Laterality Date  . Tonsillectomy    . Colonoscopy    . Cardiovascular stress test  2011  . US echocardiography    . Cardiac catheterization  2004    with 2 stents  . Sinus endo w/fusion  02/17/2011    Procedure: ENDOSCOPIC SINUS SURGERY WITH FUSION NAVIGATION;  Surgeon: Delsa Bern;  Location: [place] OR;  Service: ENT;  Laterality: N/A;  . Polypectomy  02/17/2011    Procedure: POLYPECTOMY NASAL;  Surgeon: Delsa Bern;  Location: [facility] OR;  Service: ENT;  Laterality: N/A;   History reviewed. No pertinent family history. History  Substance Use Topics  . Smoking status: Former Research scientist (life sciences)  . Smokeless tobacco: Not on file  . Alcohol Use: 3.2 oz/week    2 Cans of beer, 4 Drinks containing 0.5 oz of alcohol per week     Comment:  5-6 drinks/week    Review of Systems  Constitutional: Negative for fever, chills, diaphoresis, fatigue and unexpected weight change.  HENT: Positive for congestion, postnasal drip and rhinorrhea.   Eyes: Negative.   Respiratory: Positive for cough, shortness of breath and wheezing.   Cardiovascular: Negative for chest pain, palpitations and leg swelling.  Gastrointestinal: Negative.   Endocrine: Negative for polydipsia and polyuria.  Genitourinary: Negative.   Musculoskeletal: Negative.   Skin: Negative.   Allergic/Immunologic: Positive for environmental allergies and food allergies. Negative for immunocompromised state.  Neurological: Negative.   Hematological: Negative.   Psychiatric/Behavioral: Negative.       Allergies  Review of patient's allergies indicates no known allergies.  Home Medications   Current Outpatient Rx  Name  Route  Sig  Dispense  Refill  . albuterol (PROVENTIL HFA;VENTOLIN HFA) 108 (90 BASE) MCG/ACT inhaler   Inhalation   Inhale 1-2 puffs into the lungs every 6 (six) hours as needed. For shortness of breath          . aspirin 325 MG tablet   Oral   Take 325 mg by mouth daily.         Marland Kitchen atorvastatin (LIPITOR) 20 MG tablet   Oral   Take 20 mg by mouth daily.           . carvedilol (COREG) 12.5 MG tablet   Oral  Take 12.5 mg by mouth 2 (two) times daily.           . Fluticasone-Salmeterol (ADVAIR) 250-50 MCG/DOSE AEPB   Inhalation   Inhale 1 puff into the lungs 2 (two) times daily.           Marland Kitchen ibuprofen (ADVIL,MOTRIN) 200 MG tablet   Oral   Take 600 mg by mouth every 6 (six) hours as needed for moderate pain.         Marland Kitchen lisinopril-hydrochlorothiazide (PRINZIDE,ZESTORETIC) 20-12.5 MG per tablet   Oral   Take 1 tablet by mouth daily.           . mometasone (NASONEX) 50 MCG/ACT nasal spray   Nasal   Place 2 sprays into the nose 2 (two) times daily.         . montelukast (SINGULAIR) 10 MG tablet   Oral   Take 10 mg by  mouth at bedtime.         Marland Kitchen omeprazole (PRILOSEC) 20 MG capsule   Oral   Take 20 mg by mouth daily.         . nitroGLYCERIN (NITROSTAT) 0.4 MG SL tablet   Sublingual   Place 0.4 mg under the tongue every 5 (five) minutes as needed. For chest pain           BP 121/55  Pulse 91  Temp(Src) 98.1 F (36.7 C) (Oral)  Resp 24  SpO2 97% Physical Exam  Nursing note and vitals reviewed. Constitutional: He is oriented to person, place, and time. He appears well-developed and well-nourished. No distress.  +overweight  HENT:  Head: Normocephalic and atraumatic.  Eyes: Conjunctivae are normal. No scleral icterus.  Neck: Normal range of motion. Neck supple. No JVD present.  Cardiovascular: Normal rate, regular rhythm and normal heart sounds.   Pulmonary/Chest: Effort normal. No accessory muscle usage. Not tachypneic. No respiratory distress. He has wheezes in the right upper field, the right middle field, the right lower field, the left upper field, the left middle field and the left lower field. He has no rhonchi. He has no rales. He exhibits no tenderness.  Abdominal: Soft. Bowel sounds are normal. He exhibits no distension. There is no tenderness.  Musculoskeletal: Normal range of motion. He exhibits no edema and no tenderness.  Lymphadenopathy:    He has no cervical adenopathy.  Neurological: He is alert and oriented to person, place, and time.  Skin: Skin is warm and dry. No rash noted.  Psychiatric: He has a normal mood and affect. His behavior is normal.    ED Course  Procedures (including critical care time) Labs Review Labs Reviewed - No data to display Imaging Review No results found.  EKG: NSR @ 87 bpm with no acute ST/T wave changes or ectopy. MDM   Final diagnoses:  None  Patient has a worrisome differential diagnosis given his cardiac history. Difficulty to determine if condition is early CHF given new onset orthopnea vs. Prolonged asthma exacerbation. Little  improvement with albuterol/atrovent treatment at [facility]. CXR without acute changes/finding. Case discussed with Dr. Jake Michaelis who suggests patient undergo additional evaluation at [facility]. Patient to be transferred.    [place], [place] 04/25/13 2014

## 2013-04-25 NOTE — ED Notes (Signed)
Awaiting RT for breathing tx. RT aware.

## 2013-04-25 NOTE — ED Provider Notes (Signed)
CSN: [id number]     Arrival date & time 04/25/13  2051 History   First MD Initiated Contact with Patient 04/25/13 2144     Chief Complaint  Patient presents with  . Shortness of Breath     (Consider location/radiation/quality/duration/timing/severity/associated sxs/prior Treatment) HPI Comments: 68 yo male with asthma, CAD, stent 2004 presents with gradually worsening sob for 2 wks, worse with lying flat and exertion.  Similar to asthma but more severe.  No current steroids.  No cp.  INtermittent.   No fevers.  No blood clot hx, leg swelling, recent surgery.   Patient is a 68 y.o. male presenting with shortness of breath. The history is provided by the patient.  Shortness of Breath Severity:  Moderate Associated symptoms: cough   Associated symptoms: no abdominal pain, no chest pain, no fever, no headaches, no neck pain, no rash and no vomiting     Past Medical History  Diagnosis Date  . Myocardial infarction 2004    Dr. Tamala Julian, at [place], yearly visits  . Asthma   . Arthritis   . Hypertension   . High cholesterol    Past Surgical History  Procedure Laterality Date  . Tonsillectomy    . Colonoscopy    . Cardiovascular stress test  2011  . US echocardiography    . Cardiac catheterization  2004    with 2 stents  . Sinus endo w/fusion  02/17/2011    Procedure: ENDOSCOPIC SINUS SURGERY WITH FUSION NAVIGATION;  Surgeon: Delsa Bern;  Location: [facility];  Service: ENT;  Laterality: N/A;  . Polypectomy  02/17/2011    Procedure: POLYPECTOMY NASAL;  Surgeon: Delsa Bern;  Location: [facility];  Service: ENT;  Laterality: N/A;   History reviewed. No pertinent family history. History  Substance Use Topics  . Smoking status: Former Research scientist (life sciences)  . Smokeless tobacco: Not on file  . Alcohol Use: 3.2 oz/week    2 Cans of beer, 4 Drinks containing 0.5 oz of alcohol per week     Comment: 5-6 drinks/week    Review of Systems  Constitutional: Negative for fever and chills.  HENT:  Negative for congestion.   Eyes: Negative for visual disturbance.  Respiratory: Positive for cough and shortness of breath.   Cardiovascular: Negative for chest pain and leg swelling.  Gastrointestinal: Negative for vomiting and abdominal pain.  Genitourinary: Negative for dysuria and flank pain.  Musculoskeletal: Negative for back pain, neck pain and neck stiffness.  Skin: Negative for rash.  Neurological: Negative for light-headedness and headaches.      Allergies  Review of patient's allergies indicates no known allergies.  Home Medications   Current Outpatient Rx  Name  Route  Sig  Dispense  Refill  . albuterol (PROVENTIL HFA;VENTOLIN HFA) 108 (90 BASE) MCG/ACT inhaler   Inhalation   Inhale 1-2 puffs into the lungs every 6 (six) hours as needed. For shortness of breath          . aspirin 325 MG tablet   Oral   Take 325 mg by mouth daily.         Marland Kitchen atorvastatin (LIPITOR) 20 MG tablet   Oral   Take 20 mg by mouth daily.           . carvedilol (COREG) 12.5 MG tablet   Oral   Take 12.5 mg by mouth 2 (two) times daily.           . Fluticasone-Salmeterol (ADVAIR) 250-50 MCG/DOSE AEPB   Inhalation  Inhale 1 puff into the lungs 2 (two) times daily.           Marland Kitchen ibuprofen (ADVIL,MOTRIN) 200 MG tablet   Oral   Take 600 mg by mouth every 6 (six) hours as needed for moderate pain.         Marland Kitchen lisinopril-hydrochlorothiazide (PRINZIDE,ZESTORETIC) 20-12.5 MG per tablet   Oral   Take 1 tablet by mouth daily.           . mometasone (NASONEX) 50 MCG/ACT nasal spray   Nasal   Place 2 sprays into the nose 2 (two) times daily.         . montelukast (SINGULAIR) 10 MG tablet   Oral   Take 10 mg by mouth at bedtime.         . nitroGLYCERIN (NITROSTAT) 0.4 MG SL tablet   Sublingual   Place 0.4 mg under the tongue every 5 (five) minutes as needed. For chest pain          . omeprazole (PRILOSEC) 20 MG capsule   Oral   Take 20 mg by mouth daily.           BP 136/82  Pulse 83  Temp(Src) 97.9 F (36.6 C) (Oral)  Resp 23  Wt 236 lb 8 oz (107.276 kg)  SpO2 100% Physical Exam  Nursing note and vitals reviewed. Constitutional: He is oriented to person, place, and time. He appears well-developed and well-nourished. No distress.  HENT:  Head: Normocephalic and atraumatic.  Eyes: Conjunctivae are normal. Right eye exhibits no discharge. Left eye exhibits no discharge.  Neck: Normal range of motion. Neck supple. No tracheal deviation present.  Cardiovascular: Normal rate and regular rhythm.   Pulmonary/Chest: Respiratory distress: tachypnea. He has wheezes (exp bilateral). He has no rales.  Abdominal: Soft. He exhibits no distension. There is no tenderness. There is no guarding.  Musculoskeletal: He exhibits no edema.  Neurological: He is alert and oriented to person, place, and time.  Skin: Skin is warm. No rash noted.  Psychiatric: He has a normal mood and affect.    ED Course  Procedures (including critical care time) Labs Review Labs Reviewed  BASIC METABOLIC PANEL - Abnormal; Notable for the following:    GFR calc non Af Amer 64 (*)    GFR calc Af Amer 74 (*)    All other components within normal limits  CBC  PRO B NATRIURETIC PEPTIDE  POCT I-STAT TROPONIN I   Imaging Review Dg Chest 2 View  04/25/2013   CLINICAL DATA:  Cough and congestion.  EXAM: CHEST  2 VIEW  COMPARISON:  DG CHEST 2 VIEW dated 02/13/2011; DG CHEST 2 VIEW dated 05/30/2010  FINDINGS: Mediastinum unremarkable. Right hilar fullness is again noted and is unchanged consistent with vascular prominence. Again noted is basilar interstitial prominence consistent with interstitial fibrosis. Poor inspiration with bibasilar atelectasis A component of interstitial pneumonitis cannot be completely excluded . No pleural effusion or pneumothorax. Heart size normal. Degenerative changes thoracic spine and both shoulders.  IMPRESSION: 1. Based interstitial prominence again noted  consistent with interstitial fibrosis. A component of pneumonitis cannot be excluded. 2. Right hilar fullness, this is stable and is most likely vascular.   Electronically Signed   By: Marcello Moores  Register   On: 04/25/2013 19:59    EKG Interpretation    Date/Time:  Tuesday April 25 2013 21:42:51 EST Ventricular Rate:  81 PR Interval:  168 QRS Duration: 100 QT Interval:  385 QTC Calculation: 447 R Axis:   -  6 Text Interpretation:  Sinus rhythm RSR' in V1 or V2, right VCD or RVH Reconfirmed by Gissela Bloch  MD, Yulissa Needham ([id number]) on 04/25/2013 11:58:10 PM            MDM   Final diagnoses:  Acute asthma exacerbation  Dyspnea   Clinically asthma or copd exacerbation vs CHF with orthopnea/ exertional. No PE risks. Steroids, cont breathing treatment, COPD.  EKG no acute findings, sxs constant 2 wks, neg troponin. Increased work of breathing on arrival.  Rechecked after 1 hr cont neb, improved however still significant wheezing, no requiring O2.  Repeat cont neb.  Pt wishes to fup closely outpt afterward 3rd neb. PO fluids.  No signs of CHF on bmp or cxr, clinically asthma or copd.   Results and differential diagnosis were discussed with the patient. Close follow up outpatient was discussed, patient comfortable with the plan.        Mariea Clonts, MD 04/26/13 Lupita Shutter

## 2013-04-25 NOTE — ED Notes (Addendum)
C/o increasing SOB over past 2 weeks.  SOB with exertion. Having to sit up at night to breathe.  Hx. Asthma and 2 stents.  VS WNL. Pt. Moved to room 2.  Brad to do EKG.  Dot Been notified and will see pt.  Denies chest pain. C/o wheezing x 2 weeks.

## 2013-04-25 NOTE — ED Notes (Signed)
Pt already had a chest xray today. Portable cancelled. MD aware.

## 2013-04-26 MED ORDER — PREDNISONE 20 MG PO TABS: ORAL_TABLET | ORAL | Status: DC

## 2013-04-26 MED ORDER — ALBUTEROL SULFATE HFA 108 (90 BASE) MCG/ACT IN AERS: 1.0000 | INHALATION_SPRAY | Freq: Four times a day (QID) | RESPIRATORY_TRACT | Status: DC | PRN

## 2013-04-26 NOTE — ED Notes (Signed)
Duplicate order discontinued.  

## 2013-04-26 NOTE — ED Provider Notes (Signed)
Medical screening examination/treatment/procedure(s) were performed by non-physician practitioner and as supervising physician I was immediately available for consultation/collaboration.  Philipp Deputy, M.D.  Harden Mo, MD 04/26/13 [phone]

## 2013-04-26 NOTE — Discharge Instructions (Signed)
Use albuterol as needed. If you were given medicines take as directed.  If you are on coumadin or contraceptives realize their levels and effectiveness is altered by many different medicines.  If you have any reaction (rash, tongues swelling, other) to the medicines stop taking and see a physician.   Please follow up as directed and return to the ER or see a physician for new or worsening symptoms.  Thank you.  Asthma, Adult Asthma is a recurring condition in which the airways tighten and narrow. Asthma can make it difficult to breathe. It can cause coughing, wheezing, and shortness of breath. Asthma episodes (also called asthma attacks) range from minor to life-threatening. Asthma cannot be cured, but medicines and lifestyle changes can help control it. CAUSES Asthma is believed to be caused by inherited (genetic) and environmental factors, but its exact cause is unknown. Asthma may be triggered by allergens, lung infections, or irritants in the air. Asthma triggers are different for each person. Common triggers include:   Animal dander.  Dust mites.  Cockroaches.  Pollen from trees or grass.  Mold.  Smoke.  Air pollutants such as dust, household cleaners, hair sprays, aerosol sprays, paint fumes, strong chemicals, or strong odors.  Cold air, weather changes, and winds (which increase molds and pollens in the air).  Strong emotional expressions such as crying or laughing hard.  Stress.  Certain medicines (such as aspirin) or types of drugs (such as beta-blockers).  Sulfites in foods and drinks. Foods and drinks that may contain sulfites include dried fruit, potato chips, and sparkling grape juice.  Infections or inflammatory conditions such as the flu, a cold, or an inflammation of the nasal membranes (rhinitis).  Gastroesophageal reflux disease (GERD).  Exercise or strenuous activity. SYMPTOMS Symptoms may occur immediately after asthma is triggered or many hours later.  Symptoms include:  Wheezing.  Excessive nighttime or early morning coughing.  Frequent or severe coughing with a common cold.  Chest tightness.  Shortness of breath. DIAGNOSIS  The diagnosis of asthma is made by a review of your medical history and a physical exam. Tests may also be performed. These may include:  Lung function studies. These tests show how much air you breath in and out.  Allergy tests.  Imaging tests such as X-rays. TREATMENT  Asthma cannot be cured, but it can usually be controlled. Treatment involves identifying and avoiding your asthma triggers. It also involves medicines. There are 2 classes of medicine used for asthma treatment:   Controller medicines. These prevent asthma symptoms from occurring. They are usually taken every day.  Reliever or rescue medicines. These quickly relieve asthma symptoms. They are used as needed and provide short-term relief. Your health care provider will help you create an asthma action plan. An asthma action plan is a written plan for managing and treating your asthma attacks. It includes a list of your asthma triggers and how they may be avoided. It also includes information on when medicines should be taken and when their dosage should be changed. An action plan may also involve the use of a device called a peak flow meter. A peak flow meter measures how well the lungs are working. It helps you monitor your condition. HOME CARE INSTRUCTIONS   Take medicine as directed by your health care provider. Speak with your health care provider if you have questions about how or when to take the medicines.  Use a peak flow meter as directed by your health care provider. Record and keep  track of readings.  Understand and use the action plan to help minimize or stop an asthma attack without needing to seek medical care.  Control your home environment in the following ways to help prevent asthma attacks:  Do not smoke. Avoid being exposed  to secondhand smoke.  Change your heating and air conditioning filter regularly.  Limit your use of fireplaces and wood stoves.  Get rid of pests (such as roaches and mice) and their droppings.  Throw away plants if you see mold on them.  Clean your floors and dust regularly. Use unscented cleaning products.  Try to have someone else vacuum for you regularly. Stay out of rooms while they are being vacuumed and for a short while afterward. If you vacuum, use a dust mask from a hardware store, a double-layered or microfilter vacuum cleaner bag, or a vacuum cleaner with a HEPA filter.  Replace carpet with wood, tile, or vinyl flooring. Carpet can trap dander and dust.  Use allergy-proof pillows, mattress covers, and box spring covers.  Wash bed sheets and blankets every week in hot water and dry them in a dryer.  Use blankets that are made of polyester or cotton.  Clean bathrooms and kitchens with bleach. If possible, have someone repaint the walls in these rooms with mold-resistant paint. Keep out of the rooms that are being cleaned and painted.  Wash hands frequently. SEEK MEDICAL CARE IF:   You have wheezing, shortness of breath, or a cough even if taking medicine to prevent attacks.  The colored mucus you cough up (sputum) is thicker than usual.  Your sputum changes from clear or white to yellow, green, gray, or bloody.  You have any problems that may be related to the medicines you are taking (such as a rash, itching, swelling, or trouble breathing).  You are using a reliever medicine more than 2 3 times per week.  Your peak flow is still at 50 79% of you personal best after following your action plan for 1 hour. SEEK IMMEDIATE MEDICAL CARE IF:   You seem to be getting worse and are unresponsive to treatment during an asthma attack.  You are short of breath even at rest.  You get short of breath when doing very little physical activity.  You have difficulty eating,  drinking, or talking due to asthma symptoms.  You develop chest pain.  You develop a fast heartbeat.  You have a bluish color to your lips or fingernails.  You are lightheaded, dizzy, or faint.  Your peak flow is less than 50% of your personal best.  You have a fever or persistent symptoms for more than 2 3 days.  You have a fever and symptoms suddenly get worse. MAKE SURE YOU:   Understand these instructions.  Will watch your condition.  Will get help right away if you are not doing well or get worse. Document Released: 02/23/2005 Document Revised: 10/26/2012 Document Reviewed: 09/22/2012 [facility] Patient Information 2014 [place], [place].

## 2013-04-30 ENCOUNTER — Emergency Department (HOSPITAL_COMMUNITY)
Admission: EM | Admit: 2013-04-30 | Discharge: 2013-04-30 | Disposition: A | Discharge status: Hospice | Payer: 59 | Source: Home / Self Care | Attending: Family Medicine | Admitting: Family Medicine

## 2013-04-30 ENCOUNTER — Inpatient Hospital Stay (HOSPITAL_COMMUNITY)
Admission: EM | Admit: 2013-04-30 | Discharge: 2013-05-03 | DRG: 194 | Disposition: A | Discharge status: Home / Self Care | Payer: 59 | Attending: Internal Medicine | Admitting: Internal Medicine

## 2013-04-30 ENCOUNTER — Encounter (HOSPITAL_COMMUNITY): Payer: Self-pay | Admitting: Emergency Medicine

## 2013-04-30 ENCOUNTER — Emergency Department (INDEPENDENT_AMBULATORY_CARE_PROVIDER_SITE_OTHER): Payer: 59

## 2013-04-30 DIAGNOSIS — R0600 Dyspnea, unspecified: Secondary | ICD-10-CM

## 2013-04-30 DIAGNOSIS — R0902 Hypoxemia: Secondary | ICD-10-CM

## 2013-04-30 DIAGNOSIS — Z87891 Personal history of nicotine dependence: Secondary | ICD-10-CM

## 2013-04-30 DIAGNOSIS — Z79899 Other long term (current) drug therapy: Secondary | ICD-10-CM

## 2013-04-30 DIAGNOSIS — R0989 Other specified symptoms and signs involving the circulatory and respiratory systems: Secondary | ICD-10-CM

## 2013-04-30 DIAGNOSIS — J189 Pneumonia, unspecified organism: Secondary | ICD-10-CM

## 2013-04-30 DIAGNOSIS — J45901 Unspecified asthma with (acute) exacerbation: Secondary | ICD-10-CM | POA: Diagnosis present

## 2013-04-30 DIAGNOSIS — Z91010 Allergy to peanuts: Secondary | ICD-10-CM

## 2013-04-30 DIAGNOSIS — R0601 Orthopnea: Secondary | ICD-10-CM

## 2013-04-30 DIAGNOSIS — J1100 Influenza due to unidentified influenza virus with unspecified type of pneumonia: Principal | ICD-10-CM | POA: Diagnosis present

## 2013-04-30 DIAGNOSIS — I2510 Atherosclerotic heart disease of native coronary artery without angina pectoris: Secondary | ICD-10-CM

## 2013-04-30 DIAGNOSIS — E7800 Pure hypercholesterolemia, unspecified: Secondary | ICD-10-CM | POA: Diagnosis present

## 2013-04-30 DIAGNOSIS — J331 Polypoid sinus degeneration: Secondary | ICD-10-CM

## 2013-04-30 DIAGNOSIS — Z9861 Coronary angioplasty status: Secondary | ICD-10-CM

## 2013-04-30 DIAGNOSIS — Z7982 Long term (current) use of aspirin: Secondary | ICD-10-CM

## 2013-04-30 DIAGNOSIS — R0609 Other forms of dyspnea: Secondary | ICD-10-CM

## 2013-04-30 DIAGNOSIS — J45909 Unspecified asthma, uncomplicated: Secondary | ICD-10-CM | POA: Diagnosis present

## 2013-04-30 DIAGNOSIS — Z91018 Allergy to other foods: Secondary | ICD-10-CM

## 2013-04-30 DIAGNOSIS — J09X2 Influenza due to identified novel influenza A virus with other respiratory manifestations: Secondary | ICD-10-CM | POA: Diagnosis present

## 2013-04-30 DIAGNOSIS — M129 Arthropathy, unspecified: Secondary | ICD-10-CM | POA: Diagnosis present

## 2013-04-30 DIAGNOSIS — I10 Essential (primary) hypertension: Secondary | ICD-10-CM | POA: Diagnosis present

## 2013-04-30 DIAGNOSIS — I252 Old myocardial infarction: Secondary | ICD-10-CM

## 2013-04-30 HISTORY — DX: Atherosclerotic heart disease of native coronary artery without angina pectoris: I25.10

## 2013-04-30 HISTORY — DX: Hypoxemia: R09.02

## 2013-04-30 HISTORY — DX: Unspecified asthma, uncomplicated: J45.909

## 2013-04-30 HISTORY — DX: Pneumonia, unspecified organism: J18.9

## 2013-04-30 LAB — COMPREHENSIVE METABOLIC PANEL
ALT: 12 U/L (ref 0–53)
AST: 22 U/L (ref 0–37)
Albumin: 4 g/dL (ref 3.5–5.2)
Alkaline Phosphatase: 233 U/L — ABNORMAL HIGH (ref 39–117)
BUN: 23 mg/dL (ref 6–23)
CO2: 27 mEq/L (ref 19–32)
Calcium: 9.3 mg/dL (ref 8.4–10.5)
Chloride: 92 mEq/L — ABNORMAL LOW (ref 96–112)
Creatinine, Ser: 1.14 mg/dL (ref 0.50–1.35)
GFR calc Af Amer: 75 mL/min — ABNORMAL LOW (ref >90)
GFR calc non Af Amer: 65 mL/min — ABNORMAL LOW (ref >90)
Glucose, Bld: 100 mg/dL — ABNORMAL HIGH (ref 70–99)
Potassium: 4.5 mEq/L (ref 3.7–5.3)
Sodium: 134 mEq/L — ABNORMAL LOW (ref 137–147)
Total Bilirubin: 0.4 mg/dL (ref 0.3–1.2)
Total Protein: 7.5 g/dL (ref 6.0–8.3)

## 2013-04-30 LAB — CBC WITH DIFFERENTIAL/PLATELET
Basophils Absolute: 0 10*3/uL (ref 0.0–0.1)
Basophils Relative: 1 % (ref 0–1)
Eosinophils Absolute: 0.1 10*3/uL (ref 0.0–0.7)
Eosinophils Relative: 2 % (ref 0–5)
HCT: 42.9 % (ref 39.0–52.0)
Hemoglobin: 14.8 g/dL (ref 13.0–17.0)
Lymphocytes Relative: 11 % — ABNORMAL LOW (ref 12–46)
Lymphs Abs: 0.5 10*3/uL — ABNORMAL LOW (ref 0.7–4.0)
MCH: 30.8 pg (ref 26.0–34.0)
MCHC: 34.5 g/dL (ref 30.0–36.0)
MCV: 89.4 fL (ref 78.0–100.0)
Monocytes Absolute: 0.7 10*3/uL (ref 0.1–1.0)
Monocytes Relative: 18 % — ABNORMAL HIGH (ref 3–12)
Neutro Abs: 2.8 10*3/uL (ref 1.7–7.7)
Neutrophils Relative %: 69 % (ref 43–77)
Platelets: 204 10*3/uL (ref 150–400)
RBC: 4.8 MIL/uL (ref 4.22–5.81)
RDW: 14.1 % (ref 11.5–15.5)
WBC: 4.1 10*3/uL (ref 4.0–10.5)

## 2013-04-30 MED ORDER — HYDROCODONE-ACETAMINOPHEN 5-325 MG PO TABS: 1.0000 | ORAL_TABLET | ORAL | Status: DC | PRN

## 2013-04-30 MED ORDER — DEXTROSE 5 % IV SOLN
500.0000 mg | INTRAVENOUS | Status: DC
  Administered 2013-04-30: 500 mg via INTRAVENOUS

## 2013-04-30 MED ORDER — ACETAMINOPHEN 325 MG PO TABS: 650.0000 mg | ORAL_TABLET | Freq: Four times a day (QID) | ORAL | Status: DC | PRN

## 2013-04-30 MED ORDER — ALBUTEROL SULFATE (2.5 MG/3ML) 0.083% IN NEBU
5.0000 mg | INHALATION_SOLUTION | Freq: Once | RESPIRATORY_TRACT | Status: AC
  Administered 2013-04-30: 5 mg via RESPIRATORY_TRACT
  Filled 2013-04-30: qty 6

## 2013-04-30 MED ORDER — DEXTROSE 5 % IV SOLN
1.0000 g | INTRAVENOUS | Status: DC
  Administered 2013-05-01 – 2013-05-02 (×2): 1 g via INTRAVENOUS
  Filled 2013-04-30 (×3): qty 10

## 2013-04-30 MED ORDER — ASPIRIN 325 MG PO TABS
325.0000 mg | ORAL_TABLET | Freq: Every day | ORAL | Status: DC
  Administered 2013-05-01 – 2013-05-03 (×3): 325 mg via ORAL
  Filled 2013-04-30 (×3): qty 1

## 2013-04-30 MED ORDER — IPRATROPIUM-ALBUTEROL 0.5-2.5 (3) MG/3ML IN SOLN
3.0000 mL | RESPIRATORY_TRACT | Status: DC
  Administered 2013-04-30 – 2013-05-01 (×7): 3 mL via RESPIRATORY_TRACT
  Filled 2013-04-30 (×7): qty 3

## 2013-04-30 MED ORDER — ONDANSETRON HCL 4 MG/2ML IJ SOLN: 4.0000 mg | Freq: Four times a day (QID) | INTRAMUSCULAR | Status: DC | PRN

## 2013-04-30 MED ORDER — MONTELUKAST SODIUM 10 MG PO TABS
10.0000 mg | ORAL_TABLET | Freq: Every day | ORAL | Status: DC
  Administered 2013-04-30 – 2013-05-02 (×3): 10 mg via ORAL
  Filled 2013-04-30 (×4): qty 1

## 2013-04-30 MED ORDER — HEPARIN SODIUM (PORCINE) 5000 UNIT/ML IJ SOLN
5000.0000 [IU] | Freq: Three times a day (TID) | INTRAMUSCULAR | Status: DC
  Administered 2013-04-30 – 2013-05-03 (×8): 5000 [IU] via SUBCUTANEOUS
  Filled 2013-04-30 (×11): qty 1

## 2013-04-30 MED ORDER — ALBUTEROL SULFATE (2.5 MG/3ML) 0.083% IN NEBU
5.0000 mg | INHALATION_SOLUTION | Freq: Once | RESPIRATORY_TRACT | Status: DC
  Filled 2013-04-30: qty 6

## 2013-04-30 MED ORDER — METHYLPREDNISOLONE SODIUM SUCC 125 MG IJ SOLR
80.0000 mg | Freq: Two times a day (BID) | INTRAMUSCULAR | Status: DC
  Administered 2013-05-01: 80 mg via INTRAVENOUS
  Filled 2013-04-30 (×3): qty 1.28

## 2013-04-30 MED ORDER — SODIUM CHLORIDE 0.9 % IV SOLN
INTRAVENOUS | Status: DC
Start: 2013-04-30 — End: 2013-04-30
  Administered 2013-04-30: 16:00:00 via INTRAVENOUS

## 2013-04-30 MED ORDER — ONDANSETRON HCL 4 MG PO TABS: 4.0000 mg | ORAL_TABLET | Freq: Four times a day (QID) | ORAL | Status: DC | PRN

## 2013-04-30 MED ORDER — ACETAMINOPHEN 650 MG RE SUPP: 650.0000 mg | Freq: Four times a day (QID) | RECTAL | Status: DC | PRN

## 2013-04-30 MED ORDER — DEXTROSE 5 % IV SOLN
500.0000 mg | INTRAVENOUS | Status: DC
  Administered 2013-05-01 – 2013-05-02 (×2): 500 mg via INTRAVENOUS
  Filled 2013-04-30 (×4): qty 500

## 2013-04-30 MED ORDER — IPRATROPIUM BROMIDE 0.02 % IN SOLN
0.5000 mg | Freq: Once | RESPIRATORY_TRACT | Status: AC
  Administered 2013-04-30: 0.5 mg via RESPIRATORY_TRACT
  Filled 2013-04-30: qty 2.5

## 2013-04-30 MED ORDER — ALBUTEROL SULFATE (2.5 MG/3ML) 0.083% IN NEBU
5.0000 mg | INHALATION_SOLUTION | RESPIRATORY_TRACT | Status: DC | PRN
Start: 2013-04-30 — End: 2013-04-30

## 2013-04-30 MED ORDER — CARVEDILOL 12.5 MG PO TABS
12.5000 mg | ORAL_TABLET | Freq: Two times a day (BID) | ORAL | Status: DC
  Administered 2013-04-30 – 2013-05-03 (×6): 12.5 mg via ORAL
  Filled 2013-04-30 (×8): qty 1

## 2013-04-30 MED ORDER — FUROSEMIDE 10 MG/ML IJ SOLN
40.0000 mg | Freq: Once | INTRAMUSCULAR | Status: AC
  Administered 2013-04-30: 40 mg via INTRAVENOUS
  Filled 2013-04-30: qty 4

## 2013-04-30 MED ORDER — MORPHINE SULFATE 2 MG/ML IJ SOLN: 1.0000 mg | INTRAMUSCULAR | Status: DC | PRN

## 2013-04-30 MED ORDER — GUAIFENESIN-DM 100-10 MG/5ML PO SYRP
5.0000 mL | ORAL_SOLUTION | ORAL | Status: DC | PRN
  Administered 2013-05-01 – 2013-05-03 (×3): 5 mL via ORAL
  Filled 2013-04-30 (×4): qty 5

## 2013-04-30 MED ORDER — ATORVASTATIN CALCIUM 20 MG PO TABS
20.0000 mg | ORAL_TABLET | Freq: Every day | ORAL | Status: DC
  Administered 2013-05-01 – 2013-05-03 (×3): 20 mg via ORAL
  Filled 2013-04-30 (×3): qty 1

## 2013-04-30 MED ORDER — PANTOPRAZOLE SODIUM 40 MG PO TBEC
40.0000 mg | DELAYED_RELEASE_TABLET | Freq: Every day | ORAL | Status: DC
  Administered 2013-04-30 – 2013-05-03 (×4): 40 mg via ORAL
  Filled 2013-04-30 (×3): qty 1

## 2013-04-30 MED ORDER — GUAIFENESIN ER 600 MG PO TB12
1200.0000 mg | ORAL_TABLET | Freq: Two times a day (BID) | ORAL | Status: DC
Start: 2013-04-30 — End: 2013-05-03
  Administered 2013-04-30 – 2013-05-03 (×6): 1200 mg via ORAL
  Filled 2013-04-30 (×7): qty 2

## 2013-04-30 MED ORDER — ALUM & MAG HYDROXIDE-SIMETH 200-200-20 MG/5ML PO SUSP: 30.0000 mL | Freq: Four times a day (QID) | ORAL | Status: DC | PRN

## 2013-04-30 MED ORDER — METHYLPREDNISOLONE SODIUM SUCC 125 MG IJ SOLR
60.0000 mg | Freq: Once | INTRAMUSCULAR | Status: AC
  Administered 2013-04-30: 60 mg via INTRAVENOUS
  Filled 2013-04-30: qty 2

## 2013-04-30 MED ORDER — FLUTICASONE FUROATE-VILANTEROL 100-25 MCG/INH IN AEPB: 2.0000 | INHALATION_SPRAY | Freq: Every evening | RESPIRATORY_TRACT | Status: DC

## 2013-04-30 MED ORDER — DEXTROSE 5 % IV SOLN
1.0000 g | INTRAVENOUS | Status: DC
  Administered 2013-04-30: 1 g via INTRAVENOUS
  Filled 2013-04-30: qty 10

## 2013-04-30 MED ORDER — MOMETASONE FURO-FORMOTEROL FUM 100-5 MCG/ACT IN AERO
2.0000 | INHALATION_SPRAY | Freq: Two times a day (BID) | RESPIRATORY_TRACT | Status: DC
  Administered 2013-04-30 – 2013-05-03 (×6): 2 via RESPIRATORY_TRACT
  Filled 2013-04-30: qty 8.8

## 2013-04-30 MED ORDER — FLUTICASONE PROPIONATE 50 MCG/ACT NA SUSP
2.0000 | Freq: Every day | NASAL | Status: DC
  Administered 2013-05-01 – 2013-05-03 (×3): 2 via NASAL
  Filled 2013-04-30 (×2): qty 16

## 2013-04-30 NOTE — ED Provider Notes (Signed)
CSN: [id number]     Arrival date & time 04/30/13  1444 History   First MD Initiated Contact with Patient 04/30/13 1508     Chief Complaint  Patient presents with  . Pneumonia     (Consider location/radiation/quality/duration/timing/severity/associated sxs/prior Treatment) Patient is a 68 y.o. male presenting with pneumonia. The history is provided by the patient.  Pneumonia Associated symptoms include shortness of breath. Pertinent negatives include no chest pain, no abdominal pain and no headaches.  pt with hx asthma, presents from urgent care w dx pneumonia. Pt reports productive cough x 1 week. Progressive sob. Wheezing. Fevers. No chills/sweats.  Denies sore throat or runny nose. No heaache or neck pain/stiffness.  No chest pain or discomfort. Denies orthopnea. No increased leg swelling. Cough episodic. Sob constant, persistent. States uses inhalers daily, despite this, persistent wheezing and sob. Denies prior admission related to asthma.      Past Medical History  Diagnosis Date  . Myocardial infarction 2004    Dr. Katrinka Blazing, at [place], yearly visits  . Asthma   . Arthritis   . Hypertension   . High cholesterol    Past Surgical History  Procedure Laterality Date  . Tonsillectomy    . Colonoscopy    . Cardiovascular stress test  2011  . US echocardiography    . Cardiac catheterization  2004    with 2 stents  . Sinus endo w/fusion  02/17/2011    Procedure: ENDOSCOPIC SINUS SURGERY WITH FUSION NAVIGATION;  Surgeon: Barbee Cough;  Location: [facility];  Service: ENT;  Laterality: N/A;  . Polypectomy  02/17/2011    Procedure: POLYPECTOMY NASAL;  Surgeon: Barbee Cough;  Location: [facility];  Service: ENT;  Laterality: N/A;   No family history on file. History  Substance Use Topics  . Smoking status: Former Games developer  . Smokeless tobacco: Not on file  . Alcohol Use: 3.2 oz/week    2 Cans of beer, 4 Drinks containing 0.5 oz of alcohol per week     Comment: 5-6 drinks/week     Review of Systems  Constitutional: Positive for fever. Negative for chills.  HENT: Negative for sore throat.   Eyes: Negative for redness.  Respiratory: Positive for cough, shortness of breath and wheezing.   Cardiovascular: Negative for chest pain and leg swelling.  Gastrointestinal: Negative for vomiting, abdominal pain and diarrhea.  Genitourinary: Negative for flank pain.  Musculoskeletal: Negative for back pain and neck pain.  Skin: Negative for rash.  Neurological: Negative for headaches.  Hematological: Does not bruise/bleed easily.  Psychiatric/Behavioral: Negative for confusion.      Allergies  Beef-derived products; Chicken protein; and Peanut-containing drug products  Home Medications   Current Outpatient Rx  Name  Route  Sig  Dispense  Refill  . albuterol (PROVENTIL HFA;VENTOLIN HFA) 108 (90 BASE) MCG/ACT inhaler   Inhalation   Inhale 1-2 puffs into the lungs every 6 (six) hours as needed. For shortness of breath          . aspirin 325 MG tablet   Oral   Take 325 mg by mouth daily.         Marland Kitchen atorvastatin (LIPITOR) 20 MG tablet   Oral   Take 20 mg by mouth daily.           . carvedilol (COREG) 12.5 MG tablet   Oral   Take 12.5 mg by mouth 2 (two) times daily.           Marland Kitchen ibuprofen (ADVIL,MOTRIN) 200  MG tablet   Oral   Take 600 mg by mouth every 6 (six) hours as needed for moderate pain.         Marland Kitchen lisinopril-hydrochlorothiazide (PRINZIDE,ZESTORETIC) 20-12.5 MG per tablet   Oral   Take 1 tablet by mouth daily.           . mometasone (NASONEX) 50 MCG/ACT nasal spray   Nasal   Place 2 sprays into the nose 2 (two) times daily.         . montelukast (SINGULAIR) 10 MG tablet   Oral   Take 10 mg by mouth at bedtime.         . nitroGLYCERIN (NITROSTAT) 0.4 MG SL tablet   Sublingual   Place 0.4 mg under the tongue every 5 (five) minutes as needed. For chest pain          . omeprazole (PRILOSEC) 20 MG capsule   Oral   Take 20 mg by  mouth daily.         . predniSONE (DELTASONE) 20 MG tablet      2 tabs po daily x 4 days   8 tablet   0    BP 132/72  Pulse 108  Temp(Src) 99.3 F (37.4 C) (Oral)  Resp 22  Wt 236 lb (107.049 kg)  SpO2 86% Physical Exam  Nursing note and vitals reviewed. Constitutional: He is oriented to person, place, and time. He appears well-developed and well-nourished. No distress.  HENT:  Mouth/Throat: Oropharynx is clear and moist.  Eyes: Conjunctivae are normal. No scleral icterus.  Neck: Neck supple. No tracheal deviation present.  Cardiovascular: Regular rhythm, normal heart sounds and intact distal pulses.  Exam reveals no gallop and no friction rub.   No murmur heard. Pulmonary/Chest: Effort normal. No accessory muscle usage. No respiratory distress. He has wheezes. He has rales.  Abdominal: Soft. Bowel sounds are normal. He exhibits no distension and no mass. There is no tenderness. There is no rebound and no guarding.  Musculoskeletal: Normal range of motion. He exhibits no edema and no tenderness.  Neurological: He is alert and oriented to person, place, and time.  Skin: Skin is warm and dry.  Psychiatric: He has a normal mood and affect.    ED Course  Procedures (including critical care time)  \ Results for orders placed during the hospital encounter of 04/30/13  CBC WITH DIFFERENTIAL      Result Value Ref Range   WBC 4.1  4.0 - 10.5 K/uL   RBC 4.80  4.22 - 5.81 MIL/uL   Hemoglobin 14.8  13.0 - 17.0 g/dL   HCT 42.9  39.0 - 52.0 %   MCV 89.4  78.0 - 100.0 fL   MCH 30.8  26.0 - 34.0 pg   MCHC 34.5  30.0 - 36.0 g/dL   RDW 14.1  11.5 - 15.5 %   Platelets 204  150 - 400 K/uL   Neutrophils Relative % 69  43 - 77 %   Neutro Abs 2.8  1.7 - 7.7 K/uL   Lymphocytes Relative 11 (*) 12 - 46 %   Lymphs Abs 0.5 (*) 0.7 - 4.0 K/uL   Monocytes Relative 18 (*) 3 - 12 %   Monocytes Absolute 0.7  0.1 - 1.0 K/uL   Eosinophils Relative 2  0 - 5 %   Eosinophils Absolute 0.1  0.0 -  0.7 K/uL   Basophils Relative 1  0 - 1 %   Basophils Absolute 0.0  0.0 -  0.1 K/uL  COMPREHENSIVE METABOLIC PANEL      Result Value Ref Range   Sodium 134 (*) 137 - 147 mEq/L   Potassium 4.5  3.7 - 5.3 mEq/L   Chloride 92 (*) 96 - 112 mEq/L   CO2 27  19 - 32 mEq/L   Glucose, Bld 100 (*) 70 - 99 mg/dL   BUN 23  6 - 23 mg/dL   Creatinine, Ser 1.14  0.50 - 1.35 mg/dL   Calcium 9.3  8.4 - 10.5 mg/dL   Total Protein 7.5  6.0 - 8.3 g/dL   Albumin 4.0  3.5 - 5.2 g/dL   AST 22  0 - 37 U/L   ALT 12  0 - 53 U/L   Alkaline Phosphatase 233 (*) 39 - 117 U/L   Total Bilirubin 0.4  0.3 - 1.2 mg/dL   GFR calc non Af Amer 65 (*) >90 mL/min   GFR calc Af Amer 75 (*) >90 mL/min  TSH      Result Value Ref Range   TSH 0.184 (*) 0.350 - 4.500 uIU/mL  HEMOGLOBIN A1C      Result Value Ref Range   Hemoglobin A1C 6.1 (*) <5.7 %   Mean Plasma Glucose 128 (*) <117 mg/dL  BASIC METABOLIC PANEL      Result Value Ref Range   Sodium 133 (*) 137 - 147 mEq/L   Potassium 4.4  3.7 - 5.3 mEq/L   Chloride 94 (*) 96 - 112 mEq/L   CO2 25  19 - 32 mEq/L   Glucose, Bld 142 (*) 70 - 99 mg/dL   BUN 29 (*) 6 - 23 mg/dL   Creatinine, Ser 1.13  0.50 - 1.35 mg/dL   Calcium 9.1  8.4 - 10.5 mg/dL   GFR calc non Af Amer 65 (*) >90 mL/min   GFR calc Af Amer 76 (*) >90 mL/min  CBC      Result Value Ref Range   WBC 3.5 (*) 4.0 - 10.5 K/uL   RBC 4.40  4.22 - 5.81 MIL/uL   Hemoglobin 13.6  13.0 - 17.0 g/dL   HCT 39.0  39.0 - 52.0 %   MCV 88.6  78.0 - 100.0 fL   MCH 30.9  26.0 - 34.0 pg   MCHC 34.9  30.0 - 36.0 g/dL   RDW 14.2  11.5 - 15.5 %   Platelets 211  150 - 400 K/uL   Dg Chest 2 View  04/30/2013   CLINICAL DATA:  Productive cough fever  EXAM: CHEST  2 VIEW  COMPARISON:  04/25/2013  FINDINGS: The heart size and mediastinal contours are within normal limits. Increased opacity in the medial right lung base is noted and may represent early pneumonitis. Left lung is clear. No pleural effusions or edema. The visualized  skeletal structures are unremarkable.  IMPRESSION: Persistent prominence in the medial right lung base which correct may represent atelectasis or early pneumonia.   Electronically Signed   By: Kerby Moors M.D.   On: 04/30/2013 13:59   Dg Chest 2 View  04/25/2013   CLINICAL DATA:  Cough and congestion.  EXAM: CHEST  2 VIEW  COMPARISON:  DG CHEST 2 VIEW dated 02/13/2011; DG CHEST 2 VIEW dated 05/30/2010  FINDINGS: Mediastinum unremarkable. Right hilar fullness is again noted and is unchanged consistent with vascular prominence. Again noted is basilar interstitial prominence consistent with interstitial fibrosis. Poor inspiration with bibasilar atelectasis A component of interstitial pneumonitis cannot be completely excluded . No pleural  effusion or pneumothorax. Heart size normal. Degenerative changes thoracic spine and both shoulders.  IMPRESSION: 1. Based interstitial prominence again noted consistent with interstitial fibrosis. A component of pneumonitis cannot be excluded. 2. Right hilar fullness, this is stable and is most likely vascular.   Electronically Signed   By: Marcello Moores  Register   On: 04/25/2013 19:59      MDM  Iv ns. Reviewed cxr from urgent care.  Rocephin and zithromax iv.  Albuterol and atrovent neb.  o2 [place].   Reviewed nursing notes and prior charts for additional history.   Wheezing improved but persists post neb.  Solumedrol iv. Additional albuterol and atrovent nebs.  Med service called to admit.     Mirna Mires, MD 05/01/13 [phone]

## 2013-04-30 NOTE — Progress Notes (Signed)
Richard [name] [id number] Admission Data: 04/30/2013 6:56 PM Attending Provider: Verlee Monte, MD  JKD:[id number] Abran Cantor, MD Consults/ Treatment Team:    Richard [name] is a 67 y.o. male patient admitted from ED awake, alert  & orientated  X 3,  Full Code, VSS - Blood pressure 110/79, pulse 91, temperature 98.7 F (37.1 C), temperature source Oral, resp. rate 20, weight 107.049 kg (236 lb), SpO2 95.00%., O2    2 L nasal cannular, no c/o shortness of breath, no c/o chest pain, no distress noted.    Allergies:   Allergies  Allergen Reactions  . Beef-Derived Products     *difficulty breathing*  . Chicken Protein     *congestion*  . Peanut-Containing Drug Products     *congestion*     Past Medical History  Diagnosis Date  . Myocardial infarction 2004    Dr. Tamala Julian, at [place], yearly visits  . Asthma   . Arthritis   . Hypertension   . High cholesterol     Pt orientation to unit, room and routine. Information packet given to patient/family and safety video watched.  Admission INP armband ID verified with patient/family, and in place. SR up x 2, fall risk assessment complete with Patient and family verbalizing understanding of risks associated with falls. Pt verbalizes an understanding of how to use the call bell and to call for help before getting out of bed.  Skin, clean-dry- intact without evidence of bruising, or skin tears.   No evidence of skin break down noted on exam. no rashes    Will cont to monitor and assist as needed.  Ellana Kawa, Elissa Hefty, RN 04/30/2013 6:56 PM

## 2013-04-30 NOTE — ED Provider Notes (Signed)
CSN: [id number]     Arrival date & time 04/30/13  1150 History   First MD Initiated Contact with Patient 04/30/13 1302     Chief Complaint  Patient presents with  . Shortness of Breath  . Asthma     (Consider location/radiation/quality/duration/timing/severity/associated sxs/prior Treatment) HPI Comments: Patient presents with increased work of breathing and orthopnea. Symptoms began approximately 2.5 weeks ago. Was seen for same at [facility] on 04/25/2013 and diagnosed with asthma exacerbation. Prescribed prednisone taper and albuterol. Had follow up visit with his PCP on 04/27/2013 and his prednisone taper was extended an additional 12 days and a fluticasone inhaler was also added to his treatment regimen. States over last 24 hours, he has had significant increase in coughing and work of breathing. Increased work of breathing is not responding favorably to albuterol. States he is breathless even with simple ADLs such as getting dressed. No night sweats, weight loss, pedal edema or chest pain. Reports his chest is only sore from coughing.  PCP: Dr. Katherine [name] Cardiologist: Dr. Linard [name] (scheduled to see 05/29/2013).  Patient is a 68 y.o. male presenting with shortness of breath and asthma. The history is provided by the patient.  Shortness of Breath Asthma Associated symptoms include shortness of breath.    Past Medical History  Diagnosis Date  . Myocardial infarction 2004    Dr. Tamala [name], at [place], yearly visits  . Asthma   . Arthritis   . Hypertension   . High cholesterol    Past Surgical History  Procedure Laterality Date  . Tonsillectomy    . Colonoscopy    . Cardiovascular stress test  2011  . US echocardiography    . Cardiac catheterization  2004    with 2 stents  . Sinus endo w/fusion  02/17/2011    Procedure: ENDOSCOPIC SINUS SURGERY WITH FUSION NAVIGATION;  Surgeon: Delsa Bern;  Location: [place] OR;  Service: ENT;  Laterality: N/A;  . Polypectomy   02/17/2011    Procedure: POLYPECTOMY NASAL;  Surgeon: Delsa Bern;  Location: [facility] OR;  Service: ENT;  Laterality: N/A;   History reviewed. No pertinent family history. History  Substance Use Topics  . Smoking status: Former Research scientist (life sciences)  . Smokeless tobacco: Not on file  . Alcohol Use: 3.2 oz/week    2 Cans of beer, 4 Drinks containing 0.5 oz of alcohol per week     Comment: 5-6 drinks/week    Review of Systems  Respiratory: Positive for shortness of breath.   All other systems reviewed and are negative.      Allergies  Review of patient's allergies indicates no known allergies.  Home Medications   Current Outpatient Rx  Name  Route  Sig  Dispense  Refill  . albuterol (PROVENTIL HFA;VENTOLIN HFA) 108 (90 BASE) MCG/ACT inhaler   Inhalation   Inhale 1-2 puffs into the lungs every 6 (six) hours as needed. For shortness of breath          . albuterol (PROVENTIL HFA;VENTOLIN HFA) 108 (90 BASE) MCG/ACT inhaler   Inhalation   Inhale 1-2 puffs into the lungs every 6 (six) hours as needed for wheezing or shortness of breath.   1 Inhaler   0   . aspirin 325 MG tablet   Oral   Take 325 mg by mouth daily.         Marland Kitchen atorvastatin (LIPITOR) 20 MG tablet   Oral   Take 20 mg by mouth daily.           Marland Kitchen  carvedilol (COREG) 12.5 MG tablet   Oral   Take 12.5 mg by mouth 2 (two) times daily.           . Fluticasone-Salmeterol (ADVAIR) 250-50 MCG/DOSE AEPB   Inhalation   Inhale 1 puff into the lungs 2 (two) times daily.           Marland Kitchen ibuprofen (ADVIL,MOTRIN) 200 MG tablet   Oral   Take 600 mg by mouth every 6 (six) hours as needed for moderate pain.         Marland Kitchen lisinopril-hydrochlorothiazide (PRINZIDE,ZESTORETIC) 20-12.5 MG per tablet   Oral   Take 1 tablet by mouth daily.           . mometasone (NASONEX) 50 MCG/ACT nasal spray   Nasal   Place 2 sprays into the nose 2 (two) times daily.         . montelukast (SINGULAIR) 10 MG tablet   Oral   Take 10 mg by  mouth at bedtime.         . nitroGLYCERIN (NITROSTAT) 0.4 MG SL tablet   Sublingual   Place 0.4 mg under the tongue every 5 (five) minutes as needed. For chest pain          . omeprazole (PRILOSEC) 20 MG capsule   Oral   Take 20 mg by mouth daily.         . predniSONE (DELTASONE) 20 MG tablet      2 tabs po daily x 4 days   8 tablet   0    BP 119/70  Pulse 105  Temp(Src) 100.5 F (38.1 C) (Oral)  Resp 18  SpO2 94% Physical Exam  Nursing note and vitals reviewed. Constitutional: He is oriented to person, place, and time. He appears well-developed and well-nourished.  Increased work of breathing  HENT:  Head: Normocephalic and atraumatic.  Eyes: Conjunctivae are normal.  Neck: Normal range of motion. Neck supple. No JVD present.  Cardiovascular: Regular rhythm and normal heart sounds.  Tachycardia present.   Pulmonary/Chest: Tachypnea noted. He has wheezes in the right upper field, the right middle field, the right lower field, the left upper field, the left middle field and the left lower field. He has rhonchi in the right upper field, the right middle field, the right lower field, the left upper field, the left middle field and the left lower field.  Moderate increased work of breathing with limited ability top speak in lengthy sentences.   Musculoskeletal: Normal range of motion.  Neurological: He is alert and oriented to person, place, and time.  Skin: Skin is warm and dry. No rash noted.  Psychiatric: He has a normal mood and affect. His behavior is normal.    ED Course  Procedures (including critical care time) Labs Review Labs Reviewed - No data to display Imaging Review Dg Chest 2 View  04/30/2013   CLINICAL DATA:  Productive cough fever  EXAM: CHEST  2 VIEW  COMPARISON:  04/25/2013  FINDINGS: The heart size and mediastinal contours are within normal limits. Increased opacity in the medial right lung base is noted and may represent early pneumonitis. Left  lung is clear. No pleural effusions or edema. The visualized skeletal structures are unremarkable.  IMPRESSION: Persistent prominence in the medial right lung base which correct may represent atelectasis or early pneumonia.   Electronically Signed   By: Kerby Moors M.D.   On: 04/30/2013 13:59      MDM   Final diagnoses:  Dyspnea  CAP (community acquired pneumonia)  CXR suggestive of new RLL pneumonia in patient with persistent bronchospasm and increased work of breathing who has not responded favorably to outpatient therapies. O2 saturation at [facility] 92-94% with mild tachycardia. Will transfer to [facility] ER for probable admission.    [place], [place] 04/30/13 1421

## 2013-04-30 NOTE — H&P (Signed)
[facility] History and Physical  Richard [name] VQM:[id number] DOB: [birth year] DOA: 04/30/2013  Referring physician: Mirna Mires, MD PCP: Leola Brazil, MD   Chief Complaint: SOB Presented from: Home with wife  HPI: Richard [name] is a 68 y.o. African American male past medical history of CAD, chronic sinusitis and asthma came in to the hospital because of shortness of breath and fever. Patient said he started to have symptoms about 2 weeks ago with some congestion, postnasal drip, cough and shortness of breath he was presented at that time to urgent care which may affect him to the ED for further evaluation. Patient diagnosed with asthma exacerbation was sent home on steroids taper treated patient said the symptoms continued to bother him and today he developed fever and the sputum color changed to green so decided to come to the emergency department for further evaluation. In the ED he was found to be hypoxic on room air with oxygen saturation at 86%, chest x-ray showed RLL pneumonia. Patient also has significant wheezing patient admitted to the hospital for further evaluation.  Review of Systems:  Constitutional: Has fevers and sweats Eyes: negative for irritation, redness and visual disturbance Ears, nose, mouth, throat, and face: negative for earaches, epistaxis, nasal congestion and sore throat Respiratory: negative for cough, dyspnea on exertion, sputum and wheezing Cardiovascular: negative for chest pain, dyspnea, lower extremity edema, orthopnea, palpitations and syncope Gastrointestinal: negative for abdominal pain, constipation, diarrhea, melena, nausea and vomiting Genitourinary:negative for dysuria, frequency and hematuria Hematologic/lymphatic: negative for bleeding, easy bruising and lymphadenopathy Musculoskeletal:negative for arthralgias, muscle weakness and stiff joints Neurological: negative for coordination problems, gait problems, headaches  and weakness Endocrine: negative for diabetic symptoms including polydipsia, polyuria and weight loss Allergic/Immunologic: negative for anaphylaxis, hay fever and urticaria   Past Medical History  Diagnosis Date  . Myocardial infarction 2004    Dr. Tamala Julian, at [place], yearly visits  . Asthma   . Arthritis   . Hypertension   . High cholesterol    Past Surgical History  Procedure Laterality Date  . Tonsillectomy    . Colonoscopy    . Cardiovascular stress test  2011  . US echocardiography    . Cardiac catheterization  2004    with 2 stents  . Sinus endo w/fusion  02/17/2011    Procedure: ENDOSCOPIC SINUS SURGERY WITH FUSION NAVIGATION;  Surgeon: Delsa Bern;  Location: [place] OR;  Service: ENT;  Laterality: N/A;  . Polypectomy  02/17/2011    Procedure: POLYPECTOMY NASAL;  Surgeon: Delsa Bern;  Location: [facility] OR;  Service: ENT;  Laterality: N/A;   Social History:  reports that he has quit smoking. He does not have any smokeless tobacco history on file. He reports that he drinks about 3.2 ounces of alcohol per week. He reports that he does not use illicit drugs. He works in the [employer]  . Beef-Derived Products     *difficulty breathing*  . Chicken Protein     *congestion*  . Peanut-Containing Drug Products     *congestion*    No family history on file.   Prior to Admission medications   Medication Sig Start Date End Date Taking? Authorizing Provider  albuterol (PROVENTIL HFA;VENTOLIN HFA) 108 (90 BASE) MCG/ACT inhaler Inhale 1-2 puffs into the lungs every 6 (six) hours as needed. For shortness of breath    Yes Historical Provider, MD  aspirin 325 MG tablet Take 325 mg by mouth daily.  Yes Historical Provider, MD  atorvastatin (LIPITOR) 20 MG tablet Take 20 mg by mouth daily.     Yes Historical Provider, MD  carvedilol (COREG) 12.5 MG tablet Take 12.5 mg by mouth 2 (two) times daily.     Yes Historical Provider, MD  Fluticasone  Furoate-Vilanterol (BREO ELLIPTA) 100-25 MCG/INH AEPB Inhale 2 puffs into the lungs every evening.   Yes Historical Provider, MD  ibuprofen (ADVIL,MOTRIN) 200 MG tablet Take 400-600 mg by mouth every 6 (six) hours as needed for moderate pain.    Yes Historical Provider, MD  lisinopril-hydrochlorothiazide (PRINZIDE,ZESTORETIC) 20-12.5 MG per tablet Take 1 tablet by mouth every evening.    Yes Historical Provider, MD  mometasone (NASONEX) 50 MCG/ACT nasal spray Place 2 sprays into the nose 2 (two) times daily.   Yes Historical Provider, MD  montelukast (SINGULAIR) 10 MG tablet Take 10 mg by mouth at bedtime.   Yes Historical Provider, MD  nitroGLYCERIN (NITROSTAT) 0.4 MG SL tablet Place 0.4 mg under the tongue every 5 (five) minutes as needed. For chest pain    Yes Historical Provider, MD  omeprazole (PRILOSEC) 20 MG capsule Take 20 mg by mouth every evening.    Yes Historical Provider, MD  prednisoLONE 5 MG TABS tablet Take 10-25 mg by mouth daily. *prednisone 12 day taper pak. Takes 25mg  daily for 4 days, 15mg  daily for 4 days, then 10mg  daily for 4 days*   Yes Historical Provider, MD   Physical Exam: Filed Vitals:   04/30/13 1600  BP: 109/69  Pulse: 105  Temp:   Resp: 18    BP 109/69  Pulse 105  Temp(Src) 99.3 F (37.4 C) (Oral)  Resp 18  Wt 107.049 kg (236 lb)  SpO2 97%  General:  Appears calm and comfortable Eyes: PERRL, normal lids, irises & conjunctiva ENT: grossly normal hearing, lips & tongue Neck: no LAD, masses or thyromegaly Cardiovascular: RRR, no m/r/g. No LE edema. Telemetry: SR, no arrhythmias  Respiratory: Significant wheezing, to my exam it's coming from upper respiratory source probably from his vocal cords. It could be vocal cord spasm versus ulceration from prolonged cough. Abdomen: soft, ntnd Skin: no rash or induration seen on limited exam Musculoskeletal: grossly normal tone BUE/BLE Psychiatric: grossly normal mood and affect, speech fluent and  appropriate Neurologic: grossly non-focal.          Labs on Admission:  Basic Metabolic Panel:  Recent Labs Lab 04/25/13 2133 04/30/13 1531  NA 139 134*  K 4.6 4.5  CL 99 92*  CO2 26 27  GLUCOSE 89 100*  BUN 19 23  CREATININE 1.15 1.14  CALCIUM 9.4 9.3   Liver Function Tests:  Recent Labs Lab 04/30/13 1531  AST 22  ALT 12  ALKPHOS 233*  BILITOT 0.4  PROT 7.5  ALBUMIN 4.0   No results found for this basename: LIPASE, AMYLASE,  in the last 168 hours No results found for this basename: AMMONIA,  in the last 168 hours CBC:  Recent Labs Lab 04/25/13 2133 04/30/13 1531  WBC 8.3 4.1  NEUTROABS  --  2.8  HGB 14.3 14.8  HCT 40.7 42.9  MCV 89.6 89.4  PLT 252 204   Cardiac Enzymes: No results found for this basename: CKTOTAL, CKMB, CKMBINDEX, TROPONINI,  in the last 168 hours  BNP (last 3 results)  Recent Labs  04/25/13 2133  PROBNP 38.8   CBG: No results found for this basename: GLUCAP,  in the last 168 hours  Radiological Exams on Admission:  Dg Chest 2 View  04/30/2013   CLINICAL DATA:  Productive cough fever  EXAM: CHEST  2 VIEW  COMPARISON:  04/25/2013  FINDINGS: The heart size and mediastinal contours are within normal limits. Increased opacity in the medial right lung base is noted and may represent early pneumonitis. Left lung is clear. No pleural effusions or edema. The visualized skeletal structures are unremarkable.  IMPRESSION: Persistent prominence in the medial right lung base which correct may represent atelectasis or early pneumonia.   Electronically Signed   By: Kerby Moors M.D.   On: 04/30/2013 13:59    EKG: Independently reviewed.   Assessment/Plan Active Problems:   CAP (community acquired pneumonia)   Hypertension   CAD (coronary artery disease)   Asthma   Hypoxia    Community acquired pneumonia -RLL pneumonia, started on Rocephin and azithromycin in the emergency department. -Continue antibiotics, has significant wheezing so  steroids started as well. -Check flu PCR. -Supportive management with bronchodilators, mucolytics, and 2 doses and oxygen.  Hypoxia -Secondary to community acquired pneumonia, currently sats 100% of oxygen. -Waiting room to room air as tolerated.  Asthma -Continue bronchodilators and steroids. -Part of the wheezing is coming from the upper respiratory tract, likely from focal cords. -This could be secondary to vocal cord spasms versus ulceration from prolonged cough. -Currently able to speak without difficulty if you develop more SOB, we'll consider racemic epinephrine/heliox and PCCM.  Hypertension -Continue Coreg, hold lisinopril/HCTZ r. -Patient has trace lower extremity edema, I will try to keep negative balance, give 1 dose of IV Lasix. -Check BMP in a.m.  CAD  -History of MI with 2 stents placed in 2004. -Although patient does not have signs of florid fluid overload but he complains about orthopnea. -1 dose of Lasix given, BNP was normal on 2/17, check 2-D echo.  Code Status: Full code Family Communication: Plan discussed with the patient in the presence of his wife at bedside. Disposition Plan: MedSurg bed with continuous pulse ox.  Time spent: 70 minutes  [facility] Pager [phone]

## 2013-04-30 NOTE — ED Notes (Signed)
Patient sent to [facility] ED from urgent care with a diagnosis on pneumonia.

## 2013-04-30 NOTE — ED Notes (Signed)
Pt reports cough and cold symptoms for couple weeks. Pt seen at urgent care today and was told he has RLL pneumonia. Pt sent here for further eval. Pt with audible rhonci and decrease pulse ox. Pt able to answer questions.

## 2013-04-30 NOTE — ED Notes (Signed)
Follow up on asthma and sob.   States he was seen here and transferred to hospital   States he is not feeling any better  PCP gave patient more prednisone   States mucous is darker other than that no other changes.

## 2013-05-01 DIAGNOSIS — J09X2 Influenza due to identified novel influenza A virus with other respiratory manifestations: Secondary | ICD-10-CM

## 2013-05-01 HISTORY — DX: Influenza due to identified novel influenza A virus with other respiratory manifestations: J09.X2

## 2013-05-01 LAB — CBC
HCT: 39 % (ref 39.0–52.0)
Hemoglobin: 13.6 g/dL (ref 13.0–17.0)
MCH: 30.9 pg (ref 26.0–34.0)
MCHC: 34.9 g/dL (ref 30.0–36.0)
MCV: 88.6 fL (ref 78.0–100.0)
Platelets: 211 10*3/uL (ref 150–400)
RBC: 4.4 MIL/uL (ref 4.22–5.81)
RDW: 14.2 % (ref 11.5–15.5)
WBC: 3.5 10*3/uL — ABNORMAL LOW (ref 4.0–10.5)

## 2013-05-01 LAB — TSH: TSH: 0.184 u[IU]/mL — ABNORMAL LOW (ref 0.350–4.500)

## 2013-05-01 LAB — BASIC METABOLIC PANEL
BUN: 29 mg/dL — ABNORMAL HIGH (ref 6–23)
CO2: 25 mEq/L (ref 19–32)
Calcium: 9.1 mg/dL (ref 8.4–10.5)
Chloride: 94 mEq/L — ABNORMAL LOW (ref 96–112)
Creatinine, Ser: 1.13 mg/dL (ref 0.50–1.35)
GFR calc Af Amer: 76 mL/min — ABNORMAL LOW (ref >90)
GFR calc non Af Amer: 65 mL/min — ABNORMAL LOW (ref >90)
Glucose, Bld: 142 mg/dL — ABNORMAL HIGH (ref 70–99)
Potassium: 4.4 mEq/L (ref 3.7–5.3)
Sodium: 133 mEq/L — ABNORMAL LOW (ref 137–147)

## 2013-05-01 LAB — INFLUENZA PANEL BY PCR (TYPE A & B)
H1N1 flu by pcr: NOT DETECTED
Influenza A By PCR: POSITIVE — AB
Influenza B By PCR: NEGATIVE

## 2013-05-01 LAB — HEMOGLOBIN A1C
Hgb A1c MFr Bld: 6.1 % — ABNORMAL HIGH (ref <5.7)
Mean Plasma Glucose: 128 mg/dL — ABNORMAL HIGH (ref <117)

## 2013-05-01 LAB — T4, FREE: Free T4: 1.47 ng/dL (ref 0.80–1.80)

## 2013-05-01 MED ORDER — OSELTAMIVIR PHOSPHATE 75 MG PO CAPS
75.0000 mg | ORAL_CAPSULE | Freq: Two times a day (BID) | ORAL | Status: DC
  Administered 2013-05-01 – 2013-05-03 (×5): 75 mg via ORAL
  Filled 2013-05-01 (×6): qty 1

## 2013-05-01 MED ORDER — FUROSEMIDE 10 MG/ML IJ SOLN
20.0000 mg | Freq: Once | INTRAMUSCULAR | Status: AC
  Administered 2013-05-01: 20 mg via INTRAVENOUS
  Filled 2013-05-01: qty 2

## 2013-05-01 MED ORDER — IPRATROPIUM-ALBUTEROL 0.5-2.5 (3) MG/3ML IN SOLN
3.0000 mL | Freq: Three times a day (TID) | RESPIRATORY_TRACT | Status: DC
  Administered 2013-05-02 – 2013-05-03 (×4): 3 mL via RESPIRATORY_TRACT
  Filled 2013-05-01 (×5): qty 3

## 2013-05-01 MED ORDER — METHYLPREDNISOLONE SODIUM SUCC 40 MG IJ SOLR
40.0000 mg | Freq: Two times a day (BID) | INTRAMUSCULAR | Status: DC
Start: 2013-05-01 — End: 2013-05-03
  Administered 2013-05-01 – 2013-05-03 (×4): 40 mg via INTRAVENOUS
  Filled 2013-05-01 (×8): qty 1

## 2013-05-01 NOTE — Progress Notes (Signed)
  I have directly reviewed the clinical findings, lab, imaging studies and management of this patient in detail. I have interviewed and examined the patient and agree with the documentation,  as recorded by the Physician extender.  Aracelis Ulrey K M.D on 05/01/2013 at 3:11 PM  [facility] Office  [phone]  

## 2013-05-01 NOTE — Progress Notes (Signed)
PROGRESS NOTE  Richard [name] [id number]:[id number] DOB: 10/04/1945 DOA: 04/30/2013 PCP: Leola Brazil, MD   Richard [name] is a 68 y.o. African American male past medical history of CAD, chronic sinusitis and asthma came in to the hospital because of shortness of breath and fever. Patient said he started to have symptoms about 2 weeks ago with some congestion, postnasal drip, cough and shortness of breath he was presented to urgent care and they directed him to the ED for further evaluation. Patient diagnosed with asthma exacerbation was sent home on steroids taper treated patient said the symptoms continued to bother him and today he developed fever and the sputum color changed to green so decided to come to the emergency department for further evaluation.  In the ED he was found to be hypoxic on room air with oxygen saturation at 86%, chest x-ray showed RLL pneumonia. Patient also has significant wheezing patient admitted to the hospital for further evaluation.  2/23 - patient reports he is breathing much more easily today than at the time of admission.  Assessment/Plan:  Influenza A & Community acquired pneumonia  -RLL pneumonia, started on Rocephin and azithromycin in the emergency department.  -Continue antibiotics, has significant wheezing so steroids started as well.  -Influenza PCR positive for Influenza A.  Patient started on Tamiflu. -Supportive management with bronchodilators, mucolytics,  and oxygen.   Hypoxia  -Secondary to community acquired pneumonia, currently sats 100% on oxygen.  -wean to room air as tolerated.   Asthma  -Continue bronchodilators and steroids.   Tapered Solumedrol to 40 mg q 12 -Part of the wheezing is coming from the upper respiratory tract, likely from focal cords.  -Currently able to speak without difficulty if he develops worsening more SOB, consider racemic epinephrine/heliox and PCCM.   Hypertension  -Continue Coreg, hold  lisinopril/HCTZ .  -Patient receiving low dose IV lasix as needed for LE swelling.  CAD  -History of MI with 2 stents placed in 2004.  -BNP was normal on 2/17,  2-D echo pending  Elevated TSH No previous dx of Hyperthyroidism. Check Free T4.  If abnormal will plan for outpatient follow up.  Electrolytes abnormal Chloride and sodium slightly low. Could be PNA vs IV lasix.  Will continue to monitor.   DVT Prophylaxis:  heparin  Code Status: Full Family Communication: Wife via phone. Disposition Plan: to home when appropriate.   Antibiotics: Anti-infectives   Start     Dose/Rate Route Frequency Ordered Stop   05/01/13 1700  azithromycin (ZITHROMAX) 500 mg in dextrose 5 % 250 mL IVPB     500 mg 250 mL/hr over 60 Minutes Intravenous Every 24 hours 04/30/13 1843     05/01/13 1600  cefTRIAXone (ROCEPHIN) 1 g in dextrose 5 % 50 mL IVPB     1 g 100 mL/hr over 30 Minutes Intravenous Every 24 hours 04/30/13 1843     05/01/13 1300  oseltamivir (TAMIFLU) capsule 75 mg     75 mg Oral 2 times daily 05/01/13 1220 05/06/13 0959   04/30/13 1530  cefTRIAXone (ROCEPHIN) 1 g in dextrose 5 % 50 mL IVPB  Status:  Discontinued     1 g 100 mL/hr over 30 Minutes Intravenous Every 24 hours 04/30/13 1515 04/30/13 1843   04/30/13 1530  azithromycin (ZITHROMAX) 500 mg in dextrose 5 % 250 mL IVPB  Status:  Discontinued     500 mg 250 mL/hr over 60 Minutes Intravenous Every 24 hours 04/30/13 1515 04/30/13 1843  Objective: Filed Vitals:   05/01/13 0126 05/01/13 0404 05/01/13 0622 05/01/13 0955  BP:   128/81   Pulse: 78 81    Temp:   98.3 F (36.8 C)   TempSrc:   Oral   Resp: 18 18 18    Height:      Weight:      SpO2: 95% 96% 96% 97%   No intake or output data in the 24 hours ending 05/01/13 1237 Filed Weights   04/30/13 1456  Weight: 107.049 kg (236 lb)    Exam: General: Well developed, well nourished, NAD, appears stated age  65:  PERR, EOMI, Anicteic Sclera, MMM. No  pharyngeal erythema or exudates  Neck: Supple, no JVD, no masses  Cardiovascular: RRR, S1 S2 auscultated, no rubs, murmurs or gallops.   Respiratory: Clear to auscultation bilaterally with equal chest rise  Abdomen: Soft, nontender, nondistended, + bowel sounds  Extremities: warm dry without cyanosis clubbing or edema.  Neuro: AAOx3, cranial nerves grossly intact. Strength 5/5 in upper and lower extremities  Skin: Without rashes exudates or nodules.   Psych: Normal affect and demeanor with intact judgement and insight       Data Reviewed: Basic Metabolic Panel:  Recent Labs Lab 04/25/13 2133 04/30/13 1531 05/01/13 0644  NA 139 134* 133*  K 4.6 4.5 4.4  CL 99 92* 94*  CO2 26 27 25   GLUCOSE 89 100* 142*  BUN 19 23 29*  CREATININE 1.15 1.14 1.13  CALCIUM 9.4 9.3 9.1   Liver Function Tests:  Recent Labs Lab 04/30/13 1531  AST 22  ALT 12  ALKPHOS 233*  BILITOT 0.4  PROT 7.5  ALBUMIN 4.0   No results found for this basename: LIPASE, AMYLASE,  in the last 168 hours No results found for this basename: AMMONIA,  in the last 168 hours CBC:  Recent Labs Lab 04/25/13 2133 04/30/13 1531 05/01/13 0644  WBC 8.3 4.1 3.5*  NEUTROABS  --  2.8  --   HGB 14.3 14.8 13.6  HCT 40.7 42.9 39.0  MCV 89.6 89.4 88.6  PLT 252 204 211   Cardiac Enzymes: No results found for this basename: CKTOTAL, CKMB, CKMBINDEX, TROPONINI,  in the last 168 hours BNP (last 3 results)  Recent Labs  04/25/13 2133  PROBNP 38.8   CBG: No results found for this basename: GLUCAP,  in the last 168 hours  No results found for this or any previous visit (from the past 240 hour(s)).   Studies: Dg Chest 2 View  04/30/2013   CLINICAL DATA:  Productive cough fever  EXAM: CHEST  2 VIEW  COMPARISON:  04/25/2013  FINDINGS: The heart size and mediastinal contours are within normal limits. Increased opacity in the medial right lung base is noted and may represent early pneumonitis. Left lung is clear. No  pleural effusions or edema. The visualized skeletal structures are unremarkable.  IMPRESSION: Persistent prominence in the medial right lung base which correct may represent atelectasis or early pneumonia.   Electronically Signed   By: Kerby Moors M.D.   On: 04/30/2013 13:59    Scheduled Meds: . aspirin  325 mg Oral Daily  . atorvastatin  20 mg Oral Daily  . azithromycin  500 mg Intravenous Q24H  . carvedilol  12.5 mg Oral BID WC  . cefTRIAXone (ROCEPHIN)  IV  1 g Intravenous Q24H  . fluticasone  2 spray Each Nare Daily  . furosemide  20 mg Intravenous Once  . guaiFENesin  1,200 mg Oral  BID  . heparin  5,000 Units Subcutaneous 3 times per day  . ipratropium-albuterol  3 mL Nebulization Q4H  . methylPREDNISolone (SOLU-MEDROL) injection  40 mg Intravenous Q12H  . mometasone-formoterol  2 puff Inhalation BID  . montelukast  10 mg Oral QHS  . oseltamivir  75 mg Oral BID  . pantoprazole  40 mg Oral Daily   Continuous Infusions:   Active Problems:   CAP (community acquired pneumonia)   Hypertension   CAD (coronary artery disease)   Asthma   Hypoxia    Karen Kitchens [facility] Pager [phone]. If 7PM-7AM, please contact night-coverage at [email or web address], password [facility] 05/01/2013, 12:37 PM  LOS: 1 day

## 2013-05-02 LAB — BASIC METABOLIC PANEL
BUN: 33 mg/dL — ABNORMAL HIGH (ref 6–23)
CO2: 27 mEq/L (ref 19–32)
Calcium: 9 mg/dL (ref 8.4–10.5)
Chloride: 94 mEq/L — ABNORMAL LOW (ref 96–112)
Creatinine, Ser: 1.07 mg/dL (ref 0.50–1.35)
GFR calc Af Amer: 81 mL/min — ABNORMAL LOW (ref >90)
GFR calc non Af Amer: 70 mL/min — ABNORMAL LOW (ref >90)
Glucose, Bld: 145 mg/dL — ABNORMAL HIGH (ref 70–99)
Potassium: 4.3 mEq/L (ref 3.7–5.3)
Sodium: 134 mEq/L — ABNORMAL LOW (ref 137–147)

## 2013-05-02 NOTE — Progress Notes (Signed)
Came to visit patient on behalf of [employer] program for [employer] employees/dependents with [employer]. Denies having any needs. Left brochure and contact information if she should change his mind in the future. Marthenia Rolling, MSN-RN,BSN- [facility] [phone]

## 2013-05-02 NOTE — ED Provider Notes (Signed)
Medical screening examination/treatment/procedure(s) were performed by a resident physician or non-physician practitioner and as the supervising physician I was immediately available for consultation/collaboration.  Laree Garron, MD    Selwyn Reason S Jamilia Jacques, MD 05/02/13 0730 

## 2013-05-02 NOTE — Progress Notes (Signed)
PROGRESS NOTE  Richard [name] CVE:[id number] DOB: [birth year] DOA: 04/30/2013 PCP: Richard Brazil, MD   Richard [name] is a 68 y.o. African American male past medical history of CAD, chronic sinusitis and asthma came in to the hospital because of shortness of breath and fever. Patient said he started to have symptoms about 2 weeks ago with some congestion, postnasal drip, cough and shortness of breath he was presented to urgent care and they directed him to the ED for further evaluation. Patient diagnosed with asthma exacerbation was sent home on steroids taper treated patient said the symptoms continued to bother him and today he developed fever and the sputum color changed to green so decided to come to the emergency department for further evaluation.  In the ED he was found to be hypoxic on room air with oxygen saturation at 86%, chest x-ray showed RLL pneumonia. Patient also has significant wheezing patient admitted to the hospital for further evaluation.   Subjective  - patient reports he is breathing much more easily today than at the time of admission. Cough improved, no SOB  Assessment/Plan:  Influenza A & Community acquired pneumonia  -RLL pneumonia, started on Rocephin and azithromycin in the emergency department.  -Continue antibiotics, has significant wheezing so steroids started as well.  -Influenza PCR positive for Influenza A.  Patient started on Tamiflu. -Supportive management with bronchodilators, mucolytics,  and oxygen.     Hypoxia  -resolved on RA    Asthma  -Continue bronchodilators and steroids.   Tapered Solumedrol to 40 mg q 12 -Part of the wheezing is coming from the upper respiratory tract, likely from focal cords.  -Currently able to speak without difficulty if he develops worsening more SOB, consider racemic epinephrine/heliox and PCCM.      Hypertension  -Continue Coreg, hold lisinopril/HCTZ . stable  .    CAD  -History of MI  with 2 stents placed in 2004.  -BNP was normal on 2/17,  2-D echo pending     Elevated TSH No previous dx of Hyperthyroidism. Slightly low TSH, NML Free T4.  Repeat in 2 weeks outpt by PCP    DVT Prophylaxis:  heparin   Code Status: Full Family Communication: Wife via phone. Disposition Plan: to home when appropriate.   Antibiotics: Anti-infectives   Start     Dose/Rate Route Frequency Ordered Stop   05/01/13 1700  azithromycin (ZITHROMAX) 500 mg in dextrose 5 % 250 mL IVPB     500 mg 250 mL/hr over 60 Minutes Intravenous Every 24 hours 04/30/13 1843     05/01/13 1600  cefTRIAXone (ROCEPHIN) 1 g in dextrose 5 % 50 mL IVPB     1 g 100 mL/hr over 30 Minutes Intravenous Every 24 hours 04/30/13 1843     05/01/13 1300  oseltamivir (TAMIFLU) capsule 75 mg     75 mg Oral 2 times daily 05/01/13 1220 05/06/13 0959   04/30/13 1530  cefTRIAXone (ROCEPHIN) 1 g in dextrose 5 % 50 mL IVPB  Status:  Discontinued     1 g 100 mL/hr over 30 Minutes Intravenous Every 24 hours 04/30/13 1515 04/30/13 1843   04/30/13 1530  azithromycin (ZITHROMAX) 500 mg in dextrose 5 % 250 mL IVPB  Status:  Discontinued     500 mg 250 mL/hr over 60 Minutes Intravenous Every 24 hours 04/30/13 1515 04/30/13 1843      Objective: Filed Vitals:   05/02/13 0440 05/02/13 0812 05/02/13 0913 05/02/13 1507  BP: 111/75  144/83  136/72  Pulse: 73 77  72  Temp: 98.3 F (36.8 C)   98.9 F (37.2 C)  TempSrc: Oral   Oral  Resp: 20   20  Height:      Weight:      SpO2: 96%  92% 95%    Intake/Output Summary (Last 24 hours) at 05/02/13 1522 Last data filed at 05/02/13 1300  Gross per 24 hour  Intake    596 ml  Output      0 ml  Net    596 ml   Filed Weights   04/30/13 1456  Weight: 107.049 kg (236 lb)    Exam: General: Well developed, well nourished, NAD, appears stated age  28:  PERR, EOMI, Anicteic Sclera, MMM. No pharyngeal erythema or exudates  Neck: Supple, no JVD, no masses  Cardiovascular: RRR,  S1 S2 auscultated, no rubs, murmurs or gallops.   Respiratory: Clear to auscultation bilaterally with equal chest rise  Abdomen: Soft, nontender, nondistended, + bowel sounds  Extremities: warm dry without cyanosis clubbing or edema.  Neuro: AAOx3, cranial nerves grossly intact. Strength 5/5 in upper and lower extremities  Skin: Without rashes exudates or nodules.   Psych: Normal affect and demeanor with intact judgement and insight       Data Reviewed: Basic Metabolic Panel:  Recent Labs Lab 04/25/13 2133 04/30/13 1531 05/01/13 0644 05/02/13 0528  NA 139 134* 133* 134*  K 4.6 4.5 4.4 4.3  CL 99 92* 94* 94*  CO2 26 27 25 27   GLUCOSE 89 100* 142* 145*  BUN 19 23 29* 33*  CREATININE 1.15 1.14 1.13 1.07  CALCIUM 9.4 9.3 9.1 9.0   Liver Function Tests:  Recent Labs Lab 04/30/13 1531  AST 22  ALT 12  ALKPHOS 233*  BILITOT 0.4  PROT 7.5  ALBUMIN 4.0   No results found for this basename: LIPASE, AMYLASE,  in the last 168 hours No results found for this basename: AMMONIA,  in the last 168 hours CBC:  Recent Labs Lab 04/25/13 2133 04/30/13 1531 05/01/13 0644  WBC 8.3 4.1 3.5*  NEUTROABS  --  2.8  --   HGB 14.3 14.8 13.6  HCT 40.7 42.9 39.0  MCV 89.6 89.4 88.6  PLT 252 204 211   Cardiac Enzymes: No results found for this basename: CKTOTAL, CKMB, CKMBINDEX, TROPONINI,  in the last 168 hours BNP (last 3 results)  Recent Labs  04/25/13 2133  PROBNP 38.8   CBG: No results found for this basename: GLUCAP,  in the last 168 hours  No results found for this or any previous visit (from the past 240 hour(s)).   Studies: No results found.  Scheduled Meds: . aspirin  325 mg Oral Daily  . atorvastatin  20 mg Oral Daily  . azithromycin  500 mg Intravenous Q24H  . carvedilol  12.5 mg Oral BID WC  . cefTRIAXone (ROCEPHIN)  IV  1 g Intravenous Q24H  . fluticasone  2 spray Each Nare Daily  . guaiFENesin  1,200 mg Oral BID  . heparin  5,000 Units Subcutaneous 3  times per day  . ipratropium-albuterol  3 mL Nebulization TID  . methylPREDNISolone (SOLU-MEDROL) injection  40 mg Intravenous Q12H  . mometasone-formoterol  2 puff Inhalation BID  . montelukast  10 mg Oral QHS  . oseltamivir  75 mg Oral BID  . pantoprazole  40 mg Oral Daily   Continuous Infusions:   Active Problems:   CAP (community acquired pneumonia)  Hypertension   CAD (coronary artery disease)   Asthma   Hypoxia   Influenza due to identified novel influenza A virus with other respiratory manifestations    Thurnell Lose, MD [facility] Pager [phone]. If 7PM-7AM, please contact night-coverage at [email or web address], password [facility] 05/02/2013, 3:22 PM  LOS: 2 days

## 2013-05-02 NOTE — Plan of Care (Signed)
Problem: Phase I Progression Outcomes Goal: Pain controlled with appropriate interventions Outcome: Completed/Met Date Met:  05/02/13 Pt states that he has not had any pain.

## 2013-05-03 DIAGNOSIS — J45901 Unspecified asthma with (acute) exacerbation: Secondary | ICD-10-CM

## 2013-05-03 DIAGNOSIS — J09X2 Influenza due to identified novel influenza A virus with other respiratory manifestations: Secondary | ICD-10-CM

## 2013-05-03 MED ORDER — PREDNISONE 50 MG PO TABS: ORAL_TABLET | ORAL | Status: DC

## 2013-05-03 MED ORDER — LEVOFLOXACIN 750 MG PO TABS: 750.0000 mg | ORAL_TABLET | Freq: Every day | ORAL | Status: DC

## 2013-05-03 MED ORDER — OSELTAMIVIR PHOSPHATE 75 MG PO CAPS: 75.0000 mg | ORAL_CAPSULE | Freq: Two times a day (BID) | ORAL | Status: DC

## 2013-05-03 MED ORDER — PREDNISONE 10 MG PO TABS: ORAL_TABLET | ORAL | Status: DC

## 2013-05-03 MED ORDER — IPRATROPIUM-ALBUTEROL 0.5-2.5 (3) MG/3ML IN SOLN: 3.0000 mL | Freq: Three times a day (TID) | RESPIRATORY_TRACT | Status: DC

## 2013-05-03 MED ORDER — MOMETASONE FURO-FORMOTEROL FUM 100-5 MCG/ACT IN AERO: 2.0000 | INHALATION_SPRAY | Freq: Two times a day (BID) | RESPIRATORY_TRACT | Status: DC

## 2013-05-03 MED ORDER — GUAIFENESIN ER 600 MG PO TB12: 1200.0000 mg | ORAL_TABLET | Freq: Two times a day (BID) | ORAL | Status: DC

## 2013-05-03 NOTE — Discharge Summary (Addendum)
Physician Discharge Summary  Richard [name] ZHG:[id number] DOB: 1945/06/13 DOA: 04/30/2013  PCP: Leola Brazil, MD  Admit date: 04/30/2013 Discharge date: 05/03/2013  Time spent: 45 minutes  Recommendations for Outpatient Follow-up:  1. Please follow up with Dr. Katherine Roan in 5 days.  Check bmet for electrolyte abnormalities.   2. Check TSH in 4 weeks. 3. Repeat CXR in 2-4 weeks   Discharge Diagnoses:   CAP (community acquired pneumonia) -CXR showed RLL PNA 2/22 -Wheezing and SOB have improved, still some scattered rhonchi but overall much improved. -Oxygen saturations currently stable on RA -Discontinue Rocephin and Azithromycin (received 3 days inpatient) -Start Levafloxacin 750mg  for 4 days -Start Prednisone Taper on dishcarge -Supportive management with bronchodilators and mucolytics as needed.  Will go home with nebulizer machine for TID nebs.  Influenza A Virus  -Influenza PCR positive for influenza A -Continue Tamiflu as prescribed for additional 2 days  Hypoxia -Secondary to PNA -Resolved.  O2 Saturations currently 97-100% on room air  Hypertension -Continue Coreg -Resume lisinopril/HCTZ  CAD (coronary artery disease) -BNP on 2/17 normal -Resume Lipitor.  Continue aspirin.  Asthma Excerbation -Wheezing and SOB improved with treatment. -Continue asthma medications as they were prior to admission  -Start limited Prednisone taper -Start nebulizer treatments at home as needed every 6 hours  Elevated TSH -No previous hx of hyperthyroidism -Low TSH on 2/22; Normal Free T4 on 2/23 -Repeat TSH in 4 weeks outpatient by PCP   Discharge Condition: Stable  Diet recommendation: Heart Healthy Diet  Filed Weights   04/30/13 1456  Weight: 107.049 kg (236 lb)    History of present illness:  Patient with history of asthma, presented to ED with hypoxia, wheezing, SOB, and evidence of a RLL PNA on CXR on 2/22. Patient was admitted and progressively  improved with abx, nebulizer treatments, and steroids. Influenza PCR was positive for Influenza A. Tamiflu initiated. TSH was found to be low with a normal free T4. Patient does not have a history of hyperthyroidism.  Hospital Course:  68 y.o. African American male with a past medical history of CAD, chronic sinusitis and asthma presented to ED with SOB, wheezing and fever. Patient said he started to have symptoms about 2 weeks ago with some congestion, postnasal drip, cough and shortness of breath. He presented to urgent care and they directed him to the ED for further evaluation. Patient diagnosed with asthma exacerbation was sent home on steroids taper. On 2/22 patient developed fever and the sputum color changed to green so decided to come to the emergency department for further evaluation. In the ED he was found to be hypoxic on room air with oxygen saturation at 86%, chest x-ray showed RLL pneumonia.Patient had significant wheezing and was admitted for further evaluation.  Influenza PCR positive for Influenza A. Patient received azithromycin, rocephin, tamiflu and prednisone while inpatient. He is now breathing much more comfortably and his cough, SOB, wheeze, and oxygen saturation have improved significantly.  Procedures:  None  Consultations:  None  Discharge Exam: Filed Vitals:   05/03/13 0542  BP: 157/84  Pulse: 90  Temp: 98.3 F (36.8 C)  Resp: 20    Exam:  General: Well developed, well nourished, NAD, resting comfortably, appears stated age  HEENT: PERRL Neck: Supple, no JVD, no masses  Cardiovascular: RRR, S1 S2 auscultated, no rubs, murmurs or gallops.  Respiratory:Some wheeze to auscultation bilaterally. Much improved since admission. Abdomen: Soft, nontender, nondistended, + bowel sounds  Extremities: warm dry without cyanosis clubbing or edema.  Neuro: AAOx3, cranial nerves grossly intact. Strength 5/5 in upper and lower extremities  Skin: Without rashes exudates or  nodules.  Psych: Normal affect and demeanor with intact judgement and insight   Discharge Instructions      Discharge Orders   Future Appointments Provider Department Dept Phone   05/29/2013 9:30 AM Sinclair Grooms, MD [facility] [phone]   Future Orders Complete By Expires   Diet - low sodium heart healthy  As directed    Increase activity slowly  As directed        Medication List    STOP taking these medications       ibuprofen 200 MG tablet  Commonly known as:  ADVIL,MOTRIN     prednisoLONE 5 MG Tabs tablet      TAKE these medications       albuterol 108 (90 BASE) MCG/ACT inhaler  Commonly known as:  PROVENTIL HFA;VENTOLIN HFA  Inhale 1-2 puffs into the lungs every 6 (six) hours as needed. For shortness of breath     aspirin 325 MG tablet  Take 325 mg by mouth daily.     atorvastatin 20 MG tablet  Commonly known as:  LIPITOR  Take 20 mg by mouth daily.     BREO ELLIPTA 100-25 MCG/INH Aepb  Generic drug:  Fluticasone Furoate-Vilanterol  Inhale 2 puffs into the lungs every evening.     carvedilol 12.5 MG tablet  Commonly known as:  COREG  Take 12.5 mg by mouth 2 (two) times daily.     guaiFENesin 600 MG 12 hr tablet  Commonly known as:  MUCINEX  Take 2 tablets (1,200 mg total) by mouth 2 (two) times daily.     ipratropium-albuterol 0.5-2.5 (3) MG/3ML Soln  Commonly known as:  DUONEB  Take 3 mLs by nebulization 3 (three) times daily. When feeling better change instructions to take every 6 hours if needed for wheezing or shortness of breath     levofloxacin 750 MG tablet  Commonly known as:  LEVAQUIN  Take 1 tablet (750 mg total) by mouth daily.     lisinopril-hydrochlorothiazide 20-12.5 MG per tablet  Commonly known as:  PRINZIDE,ZESTORETIC  Take 1 tablet by mouth every evening.     mometasone 50 MCG/ACT nasal spray  Commonly known as:  NASONEX  Place 2 sprays into the nose 2 (two) times daily.     mometasone-formoterol 100-5  MCG/ACT Aero  Commonly known as:  DULERA  Inhale 2 puffs into the lungs 2 (two) times daily.     montelukast 10 MG tablet  Commonly known as:  SINGULAIR  Take 10 mg by mouth at bedtime.     nitroGLYCERIN 0.4 MG SL tablet  Commonly known as:  NITROSTAT  Place 0.4 mg under the tongue every 5 (five) minutes as needed. For chest pain     omeprazole 20 MG capsule  Commonly known as:  PRILOSEC  Take 20 mg by mouth every evening.     oseltamivir 75 MG capsule  Commonly known as:  TAMIFLU  Take 1 capsule (75 mg total) by mouth 2 (two) times daily.     predniSONE 10 MG tablet  Commonly known as:  DELTASONE  Taper take 5 tablets this evening.   Then take 5 tablets with breakfast 2/26, 2/27.  Take 4 tablets with breakfast on 2/28, 3/1.  Take 3 tabs with breakfast on 3/2 and 3/3.  Take 2 tabs with breakfast on 3/4 and 3/5.   Then take  1 tab with breakfast on 3/6 and 3/7.  Then stop.       Allergies  Allergen Reactions  . Beef-Derived Products     *difficulty breathing*  . Chicken Protein     *congestion*  . Peanut-Containing Drug Products     *congestion*   Follow-up Information   Follow up with KILPATRICK JR,GEORGE R, MD In 5 days.   Specialty:  Pulmonary Disease   Contact information:   [address] [place] [address]       Follow up with Leola Brazil, MD On 05/08/2013. (Appointment with Dr. Katherine Roan is on March 2nd at [facility] phone number is [phone])    Specialty:  Pulmonary Disease   Contact information:   [address] [place] [address]        The results of significant diagnostics from this hospitalization (including imaging, microbiology, ancillary and laboratory) are listed below for reference.    Significant Diagnostic Studies: Dg Chest 2 View  04/30/2013   CLINICAL DATA:  Productive cough fever  EXAM: CHEST  2 VIEW  COMPARISON:  04/25/2013  FINDINGS: The heart size and mediastinal contours are within normal limits. Increased opacity  in the medial right lung base is noted and may represent early pneumonitis. Left lung is clear. No pleural effusions or edema. The visualized skeletal structures are unremarkable.  IMPRESSION: Persistent prominence in the medial right lung base which correct may represent atelectasis or early pneumonia.   Electronically Signed   By: Kerby Moors M.D.   On: 04/30/2013 13:59   Dg Chest 2 View  04/25/2013   CLINICAL DATA:  Cough and congestion.  EXAM: CHEST  2 VIEW  COMPARISON:  DG CHEST 2 VIEW dated 02/13/2011; DG CHEST 2 VIEW dated 05/30/2010  FINDINGS: Mediastinum unremarkable. Right hilar fullness is again noted and is unchanged consistent with vascular prominence. Again noted is basilar interstitial prominence consistent with interstitial fibrosis. Poor inspiration with bibasilar atelectasis A component of interstitial pneumonitis cannot be completely excluded . No pleural effusion or pneumothorax. Heart size normal. Degenerative changes thoracic spine and both shoulders.  IMPRESSION: 1. Based interstitial prominence again noted consistent with interstitial fibrosis. A component of pneumonitis cannot be excluded. 2. Right hilar fullness, this is stable and is most likely vascular.   Electronically Signed   By: Marcello Moores  Register   On: 04/25/2013 19:59   Labs: Basic Metabolic Panel:  Recent Labs Lab 04/30/13 1531 05/01/13 0644 05/02/13 0528  NA 134* 133* 134*  K 4.5 4.4 4.3  CL 92* 94* 94*  CO2 27 25 27   GLUCOSE 100* 142* 145*  BUN 23 29* 33*  CREATININE 1.14 1.13 1.07  CALCIUM 9.3 9.1 9.0   Liver Function Tests:  Recent Labs Lab 04/30/13 1531  AST 22  ALT 12  ALKPHOS 233*  BILITOT 0.4  PROT 7.5  ALBUMIN 4.0   CBC:  Recent Labs Lab 04/30/13 1531 05/01/13 0644  WBC 4.1 3.5*  NEUTROABS 2.8  --   HGB 14.8 13.6  HCT 42.9 39.0  MCV 89.4 88.6  PLT 204 211    BNP: BNP (last 3 results)  Recent Labs  04/25/13 2133  PROBNP 38.8     Signed:  Nyra Jabs, [place] [phone] [facility] 05/03/2013, 2:00 PM  Attending - Patient seen and examined, agree with the above assessment and plan. Chart reviewed, patient seems to have improved significantly. He is not requiring any oxygen 2 in the stool. He has good  air entry bilaterally, some wheeze is present but it's only scattered. Suspect he is stable to be discharged home today with the above-noted medications. Patient claims he will followup with his PCP this coming Monday.  Nena Alexander MD

## 2013-05-03 NOTE — Discharge Instructions (Signed)
Follow with Primary MD KILPATRICK JR,GEORGE R, MD in 7 days   Get CBC, CMP, TSH, Free T3, T4 checked 5 days by Primary MD and again as instructed by your Primary MD. Get a 2 view Chest X ray done next visit .   Activity: As tolerated with Full fall precautions use walker/cane & assistance as needed   Disposition Home     Diet: Heart Healthy    On your next visit with her primary care physician please Get Medicines reviewed and adjusted.  Please request your Prim.MD to go over all Hospital Tests and Procedure/Radiological results at the follow up, please get all Hospital records sent to your Prim MD by signing hospital release before you go home.   If you experience worsening of your admission symptoms, develop shortness of breath, life threatening emergency, suicidal or homicidal thoughts you must seek medical attention immediately by calling 911 or calling your MD immediately  if symptoms less severe.  You Must read complete instructions/literature along with all the possible adverse reactions/side effects for all the Medicines you take and that have been prescribed to you. Take any new Medicines after you have completely understood and accpet all the possible adverse reactions/side effects.    Do not drive when taking Pain medications.     Special Instructions: If you have smoked or chewed Tobacco  in the last 2 yrs please stop smoking, stop any regular Alcohol  and or any Recreational drug use.  Wear Seat belts while driving.   Please note  You were cared for by a hospitalist during your hospital stay. If you have any questions about your discharge medications or the care you received while you were in the hospital after you are discharged, you can call the unit and asked to speak with the hospitalist on call if the hospitalist that took care of you is not available. Once you are discharged, your primary care physician will handle any further medical issues. Please note that NO  REFILLS for any discharge medications will be authorized once you are discharged, as it is imperative that you return to your primary care physician (or establish a relationship with a primary care physician if you do not have one) for your aftercare needs so that they can reassess your need for medications and monitor your lab values.

## 2013-05-03 NOTE — Care Management Note (Signed)
    Page 1 of 1   05/03/2013     4:35:21 PM   CARE MANAGEMENT NOTE 05/03/2013  Patient:  Richard [name], Richard [name]   Account Number:  [id number]  Date Initiated:  05/03/2013  Documentation initiated by:  Tomi Bamberger  Subjective/Objective Assessment:   dx cap  admit- from home     Action/Plan:   Anticipated DC Date:  05/03/2013   Anticipated DC Plan:  [place]  CM consult      PAC Choice  DURABLE MEDICAL EQUIPMENT   Choice offered to / List presented to:  C-1 Patient   DME arranged  NEBULIZER MACHINE      DME agency  [facility].        Status of service:  Completed, signed off Medicare Important Message given?   (If response is "NO", the following Medicare IM given date fields will be blank) Date Medicare IM given:   Date Additional Medicare IM given:    Discharge Disposition:  HOME/SELF CARE  Per UR Regulation:  Reviewed for med. necessity/level of care/duration of stay  If discussed at [place] of Stay Meetings, dates discussed:    Comments:

## 2013-05-17 ENCOUNTER — Encounter: Payer: Self-pay | Admitting: Interventional Cardiology

## 2013-05-29 ENCOUNTER — Ambulatory Visit: Payer: 59 | Admitting: Interventional Cardiology

## 2013-06-26 ENCOUNTER — Ambulatory Visit (INDEPENDENT_AMBULATORY_CARE_PROVIDER_SITE_OTHER): Payer: 59 | Admitting: Interventional Cardiology

## 2013-06-26 ENCOUNTER — Encounter: Payer: Self-pay | Admitting: Interventional Cardiology

## 2013-06-26 VITALS — BP 126/80 | HR 64 | Ht 67.0 in | Wt 239.0 lb

## 2013-06-26 DIAGNOSIS — E785 Hyperlipidemia, unspecified: Secondary | ICD-10-CM

## 2013-06-26 DIAGNOSIS — I2510 Atherosclerotic heart disease of native coronary artery without angina pectoris: Secondary | ICD-10-CM

## 2013-06-26 DIAGNOSIS — I10 Essential (primary) hypertension: Secondary | ICD-10-CM

## 2013-06-26 HISTORY — DX: Hyperlipidemia, unspecified: E78.5

## 2013-06-26 NOTE — Progress Notes (Signed)
Patient ID: Richard [name], male   DOB: 21-Jan-1946, 68 y.o.   MRN: [id number]    [address] [place], [place]  [address] Phone: [phone] Fax:  [phone]  Date:  06/26/2013   ID:  Richard [name], DOB 1945/05/15, MRN [id number]  PCP:  Leola Brazil, MD   ASSESSMENT:  1. Coronary artery disease status post circumflex drug-eluting stent 2004 during acute coronary syndrome complicated by ventricular fibrillation 2. Hypertension, controlled  3. Hyperlipidemia on therapy    PLAN:  1. increase physical activity 2. Weight loss 3. Clinical followup in one year. Call if chest discomfort, angina, or excessive dyspnea with physical activity   SUBJECTIVE: Richard [name] is a 68 y.o. male who is doing well. He has been very sedentary. He denies any cardiopulmonary complaints. He had flu and pneumonia earlier this year. He denies medication side effects.   Wt Readings from Last 3 Encounters:  06/26/13 239 lb (108.41 kg)  04/30/13 236 lb (107.049 kg)  04/25/13 236 lb 8 oz (107.276 kg)     Past Medical History  Diagnosis Date  . Myocardial infarction 2004    Dr. Tamala Julian, at [place], yearly visits  . Asthma   . Arthritis   . Hypertension   . High cholesterol     Current Outpatient Prescriptions  Medication Sig Dispense Refill  . albuterol (PROVENTIL HFA;VENTOLIN HFA) 108 (90 BASE) MCG/ACT inhaler Inhale 1-2 puffs into the lungs every 6 (six) hours as needed. For shortness of breath       . aspirin 325 MG tablet Take 325 mg by mouth daily.      Marland Kitchen atorvastatin (LIPITOR) 20 MG tablet Take 20 mg by mouth daily.        . carvedilol (COREG) 12.5 MG tablet Take 12.5 mg by mouth 2 (two) times daily.        . Fluticasone Furoate-Vilanterol (BREO ELLIPTA) 100-25 MCG/INH AEPB Inhale 2 puffs into the lungs every evening.      Marland Kitchen guaiFENesin (MUCINEX) 600 MG 12 hr tablet Take 2 tablets (1,200 mg total) by mouth 2 (two) times daily.      Marland Kitchen  ipratropium-albuterol (DUONEB) 0.5-2.5 (3) MG/3ML SOLN Take 3 mLs by nebulization 3 (three) times daily. When feeling better change instructions to take every 6 hours if needed for wheezing or shortness of breath  360 mL  6  . levofloxacin (LEVAQUIN) 750 MG tablet Take 1 tablet (750 mg total) by mouth daily.  4 tablet  0  . lisinopril-hydrochlorothiazide (PRINZIDE,ZESTORETIC) 20-12.5 MG per tablet Take 1 tablet by mouth every evening.       . mometasone (NASONEX) 50 MCG/ACT nasal spray Place 2 sprays into the nose 2 (two) times daily.      . mometasone-formoterol (DULERA) 100-5 MCG/ACT AERO Inhale 2 puffs into the lungs 2 (two) times daily.  1 Inhaler  12  . montelukast (SINGULAIR) 10 MG tablet Take 10 mg by mouth at bedtime.      . nitroGLYCERIN (NITROSTAT) 0.4 MG SL tablet Place 0.4 mg under the tongue every 5 (five) minutes as needed. For chest pain       . omeprazole (PRILOSEC) 20 MG capsule Take 20 mg by mouth every evening.        No current facility-administered medications for this visit.    Allergies:    Allergies  Allergen Reactions  . Beef-Derived Products     *difficulty breathing*  . Chicken Protein     *congestion*  .  Peanut-Containing Drug Products     *congestion*    Social History:  The patient  reports that he has quit smoking. He does not have any smokeless tobacco history on file. He reports that he drinks about 3.2 ounces of alcohol per week. He reports that he does not use illicit drugs.   ROS:  Please see the history of present illness.   Sedentary lifestyle   All other systems reviewed and negative.   OBJECTIVE: VS:  BP 126/80  Pulse 64  Ht 5\' 7"  (1.702 m)  Wt 239 lb (108.41 kg)  BMI 37.42 kg/m2 Well nourished, well developed, in no acute distress, obese  HEENT: normal Neck: JVD flat . Carotid bruit absent   Cardiac:  normal S1, S2; RRR; no murmur Lungs:  clear to auscultation bilaterally, no wheezing, rhonchi or rales Abd: soft, nontender, no  hepatomegaly Ext: Edema absent . Pulses 1+ bilateral  Skin: warm and dry Neuro:  CNs 2-12 intact, no focal abnormalities noted  EKG:  Not repeated       Signed, Illene Labrador III, MD 06/26/2013 11:48 AM

## 2013-06-26 NOTE — Patient Instructions (Signed)
Your physician recommends that you continue on your current medications as directed. Please refer to the Current Medication list given to you today.  Your physician discussed the importance of regular exercise and recommended that you start or continue a regular exercise program for good health.   Your physician wants you to follow-up in: 1 year You will receive a reminder letter in the mail two months in advance. If you don't receive a letter, please call our office to schedule the follow-up appointment.  

## 2014-04-03 ENCOUNTER — Emergency Department (HOSPITAL_COMMUNITY)
Admission: EM | Admit: 2014-04-03 | Discharge: 2014-04-03 | Disposition: A | Discharge status: Home / Self Care | Payer: 59 | Source: Home / Self Care | Attending: Family Medicine | Admitting: Family Medicine

## 2014-04-03 ENCOUNTER — Encounter (HOSPITAL_COMMUNITY): Payer: Self-pay | Admitting: Emergency Medicine

## 2014-04-03 DIAGNOSIS — H109 Unspecified conjunctivitis: Secondary | ICD-10-CM

## 2014-04-03 MED ORDER — TOBRAMYCIN 0.3 % OP SOLN: 2.0000 [drp] | OPHTHALMIC | Status: DC

## 2014-04-03 NOTE — Discharge Instructions (Signed)

## 2014-04-03 NOTE — ED Provider Notes (Signed)
CSN: [id number]     Arrival date & time 04/03/14  1230 History   First MD Initiated Contact with Patient 04/03/14 1348     Chief Complaint  Patient presents with  . Eye Problem    right eye redness   (Consider location/radiation/quality/duration/timing/severity/associated sxs/prior Treatment) HPI Comments: No recent illness, injury, previous events. NO eye pain.  Works in [place] at [employer]  Patient is a 69 y.o. male presenting with eye problem. The history is provided by the patient.  Eye Problem Location:  R eye Quality: +mild sense of pressure. Severity:  Mild Onset quality:  Gradual Duration:  1 day Timing:  Constant Progression:  Unchanged Chronicity:  New Context: not contact lens problem, not direct trauma, not foreign body and not scratch   Associated symptoms: redness   Associated symptoms: no blurred vision, no crusting, no decreased vision, no discharge, no double vision, no facial rash, no foreign body sensation, no headaches, no itching, no nausea, no numbness, no photophobia, no scotomas, no swelling, no tearing, no tingling, no vomiting and no weakness   Risk factors: no conjunctival hemorrhage, no previous injury to eye, no recent herpes zoster and no recent URI     Past Medical History  Diagnosis Date  . Myocardial infarction 2004    Dr. Tamala Julian, at [place] [facility], yearly visits  . Asthma   . Arthritis   . Hypertension   . High cholesterol    Past Surgical History  Procedure Laterality Date  . Tonsillectomy    . Colonoscopy    . Cardiovascular stress test  2011  . US echocardiography    . Cardiac catheterization  2004    with 2 stents  . Sinus endo w/fusion  02/17/2011    Procedure: ENDOSCOPIC SINUS SURGERY WITH FUSION NAVIGATION;  Surgeon: Delsa Bern;  Location: [facility];  Service: ENT;  Laterality: N/A;  . Polypectomy  02/17/2011    Procedure: POLYPECTOMY NASAL;  Surgeon: Delsa Bern;  Location: [facility];  Service: ENT;  Laterality:  N/A;   Family History  Problem Relation Age of Onset  . Heart attack Father    History  Substance Use Topics  . Smoking status: Former Research scientist (life sciences)  . Smokeless tobacco: Not on file  . Alcohol Use: 3.2 oz/week    2 Cans of beer, 4 Not specified per week     Comment: 5-6 drinks/week    Review of Systems  Eyes: Positive for redness. Negative for blurred vision, double vision, photophobia, discharge and itching.  Gastrointestinal: Negative for nausea and vomiting.  Neurological: Negative for tingling, weakness, numbness and headaches.  All other systems reviewed and are negative.   Allergies  Beef-derived products; Chicken protein; and Peanut-containing drug products  Home Medications   Prior to Admission medications   Medication Sig Start Date End Date Taking? Authorizing Provider  aspirin 325 MG tablet Take 325 mg by mouth daily.   Yes Historical Provider, MD  atorvastatin (LIPITOR) 20 MG tablet Take 20 mg by mouth daily.     Yes Historical Provider, MD  carvedilol (COREG) 12.5 MG tablet Take 12.5 mg by mouth 2 (two) times daily.     Yes Historical Provider, MD  lisinopril-hydrochlorothiazide (PRINZIDE,ZESTORETIC) 20-12.5 MG per tablet Take 1 tablet by mouth every evening.    Yes Historical Provider, MD  mometasone (NASONEX) 50 MCG/ACT nasal spray Place 2 sprays into the nose 2 (two) times daily.   Yes Historical Provider, MD  mometasone-formoterol (DULERA) 100-5 MCG/ACT AERO Inhale  2 puffs into the lungs 2 (two) times daily. 05/03/13  Yes Marianne L York, PA-C  montelukast (SINGULAIR) 10 MG tablet Take 10 mg by mouth at bedtime.   Yes Historical Provider, MD  omeprazole (PRILOSEC) 20 MG capsule Take 20 mg by mouth every evening.    Yes Historical Provider, MD  albuterol (PROVENTIL HFA;VENTOLIN HFA) 108 (90 BASE) MCG/ACT inhaler Inhale 1-2 puffs into the lungs every 6 (six) hours as needed. For shortness of breath     Historical Provider, MD  Fluticasone Furoate-Vilanterol (BREO  ELLIPTA) 100-25 MCG/INH AEPB Inhale 2 puffs into the lungs every evening.    Historical Provider, MD  guaiFENesin (MUCINEX) 600 MG 12 hr tablet Take 2 tablets (1,200 mg total) by mouth 2 (two) times daily. 05/03/13   Bobby Rumpf York, PA-C  ipratropium-albuterol (DUONEB) 0.5-2.5 (3) MG/3ML SOLN Take 3 mLs by nebulization 3 (three) times daily. When feeling better change instructions to take every 6 hours if needed for wheezing or shortness of breath 05/03/13   Melton Alar, PA-C  levofloxacin (LEVAQUIN) 750 MG tablet Take 1 tablet (750 mg total) by mouth daily. 05/03/13   Melton Alar, PA-C  nitroGLYCERIN (NITROSTAT) 0.4 MG SL tablet Place 0.4 mg under the tongue every 5 (five) minutes as needed. For chest pain     Historical Provider, MD  tobramycin (TOBREX) 0.3 % ophthalmic solution Place 2 drops into the right eye every 4 (four) hours. X 7 days 04/03/14   Annett Gula H Rylea Selway, PA   BP 114/67 mmHg  Pulse 76  Temp(Src) 99 F (37.2 C) (Oral)  Resp 16  SpO2 99% Physical Exam  Constitutional: He is oriented to person, place, and time. He appears well-developed and well-nourished. No distress.  HENT:  Head: Normocephalic and atraumatic.  Right Ear: External ear normal.  Left Ear: External ear normal.  Nose: Nose normal.  Mouth/Throat: Oropharynx is clear and moist.  Eyes: Lids are normal. Pupils are equal, round, and reactive to light. Right eye exhibits no chemosis, no discharge, no exudate and no hordeolum. No foreign body present in the right eye. Left eye exhibits no chemosis, no discharge, no exudate and no hordeolum. No foreign body present in the left eye. Right conjunctiva is injected. Right conjunctiva has no hemorrhage. Left conjunctiva is not injected. Left conjunctiva has no hemorrhage. No scleral icterus. Right eye exhibits normal extraocular motion and no nystagmus. Left eye exhibits normal extraocular motion and no nystagmus.  Slit lamp exam:      The right eye shows no corneal  abrasion, no corneal ulcer, no foreign body and no fluorescein uptake.  No photophobia.   Cardiovascular: Normal rate.   Pulmonary/Chest: Effort normal.  Musculoskeletal: Normal range of motion.  Neurological: He is alert and oriented to person, place, and time.  Skin: Skin is warm and dry. No rash noted. No erythema.  Psychiatric: He has a normal mood and affect. His behavior is normal.  Nursing note and vitals reviewed.   ED Course  Procedures (including critical care time) Labs Review Labs Reviewed - No data to display  Imaging Review No results found.   MDM   1. Conjunctivitis of right eye    Will treat for conjunctivitis and advise close follow up with ophthalmology if symptoms persist or worsen. Tobrex as prescribed.     Lutricia Feil, [place] 04/03/14 1436

## 2014-04-03 NOTE — ED Notes (Signed)
C/o redness of the right eye with pressure behind eye.  No irritation, drainage, or injury.   On set last night.

## 2014-06-27 ENCOUNTER — Ambulatory Visit (INDEPENDENT_AMBULATORY_CARE_PROVIDER_SITE_OTHER): Payer: 59 | Admitting: Interventional Cardiology

## 2014-06-27 ENCOUNTER — Encounter: Payer: Self-pay | Admitting: Interventional Cardiology

## 2014-06-27 VITALS — BP 122/70 | HR 65 | Ht 67.0 in | Wt 236.2 lb

## 2014-06-27 DIAGNOSIS — I10 Essential (primary) hypertension: Secondary | ICD-10-CM | POA: Diagnosis not present

## 2014-06-27 DIAGNOSIS — I4510 Unspecified right bundle-branch block: Secondary | ICD-10-CM | POA: Insufficient documentation

## 2014-06-27 DIAGNOSIS — E785 Hyperlipidemia, unspecified: Secondary | ICD-10-CM | POA: Diagnosis not present

## 2014-06-27 DIAGNOSIS — I2510 Atherosclerotic heart disease of native coronary artery without angina pectoris: Secondary | ICD-10-CM | POA: Diagnosis not present

## 2014-06-27 HISTORY — DX: Unspecified right bundle-branch block: I45.10

## 2014-06-27 NOTE — Progress Notes (Signed)
Cardiology Office Note   Date:  06/27/2014   ID:  Richard [name], DOB 04/22/1945, MRN [id number]  PCP:  Leola Brazil, MD  Cardiologist:   Sinclair Grooms, MD   Chief Complaint  Patient presents with  . Coronary Artery Disease      History of Present Illness: Richard [name] is a 69 y.o. male who presents for CAD, acute coronary syndrome requiring circumflex stents, ventricular fibrillation during ischemia, hypertension, hyperlipidemia.  Richard [name] doing well. He has no cardiopulmonary complaints. Then the limitations with physical activity. He denies angina. There is no orthopnea, PND, lower extremity swelling.    Past Medical History  Diagnosis Date  . Myocardial infarction 2004    Dr. Tamala Julian, at [facility], yearly visits  . Asthma   . Arthritis   . Hypertension   . High cholesterol     Past Surgical History  Procedure Laterality Date  . Tonsillectomy    . Colonoscopy    . Cardiovascular stress test  2011  . US echocardiography    . Cardiac catheterization  2004    with 2 stents  . Sinus endo w/fusion  02/17/2011    Procedure: ENDOSCOPIC SINUS SURGERY WITH FUSION NAVIGATION;  Surgeon: Delsa Bern;  Location: [facility];  Service: ENT;  Laterality: N/A;  . Polypectomy  02/17/2011    Procedure: POLYPECTOMY NASAL;  Surgeon: Delsa Bern;  Location: [facility];  Service: ENT;  Laterality: N/A;     Current Outpatient Prescriptions  Medication Sig Dispense Refill  . albuterol (PROVENTIL HFA;VENTOLIN HFA) 108 (90 BASE) MCG/ACT inhaler Inhale 1-2 puffs into the lungs every 6 (six) hours as needed. For shortness of breath     . aspirin 325 MG tablet Take 325 mg by mouth daily.    Marland Kitchen atorvastatin (LIPITOR) 20 MG tablet Take 20 mg by mouth daily.      . carvedilol (COREG) 12.5 MG tablet Take 12.5 mg by mouth 2 (two) times daily.      . Fluticasone Furoate-Vilanterol 200-25 MCG/INH AEPB Inhale 200 mcg into the lungs daily.    Marland Kitchen ipratropium-albuterol (DUONEB)  0.5-2.5 (3) MG/3ML SOLN Take 3 mLs by nebulization 3 (three) times daily. When feeling better change instructions to take every 6 hours if needed for wheezing or shortness of breath 360 mL 6  . lisinopril-hydrochlorothiazide (PRINZIDE,ZESTORETIC) 20-12.5 MG per tablet Take 1 tablet by mouth every evening.     . mometasone (NASONEX) 50 MCG/ACT nasal spray Place 2 sprays into the nose 2 (two) times daily.    . mometasone-formoterol (DULERA) 100-5 MCG/ACT AERO Inhale 2 puffs into the lungs 2 (two) times daily. 1 Inhaler 12  . montelukast (SINGULAIR) 10 MG tablet Take 10 mg by mouth at bedtime.    . nitroGLYCERIN (NITROSTAT) 0.4 MG SL tablet Place 0.4 mg under the tongue every 5 (five) minutes as needed. For chest pain     . omeprazole (PRILOSEC) 20 MG capsule Take 20 mg by mouth every evening.      No current facility-administered medications for this visit.    Allergies:   Beef-derived products; Chicken protein; and Peanut-containing drug products    Social History:  The patient  reports that he has quit smoking. He does not have any smokeless tobacco history on file. He reports that he drinks about 3.2 oz of alcohol per week. He reports that he does not use illicit drugs.   Family History:  The patient's family history includes Heart attack in his father.  ROS:  Please see the history of present illness.   Otherwise, review of systems are positive for none.   All other systems are reviewed and negative.    PHYSICAL EXAM: VS:  BP 122/70 mmHg  Pulse 65  Ht 5\' 7"  (1.702 m)  Wt 236 lb 3.2 oz (107.14 kg)  BMI 36.99 kg/m2 , BMI Body mass index is 36.99 kg/(m^2). GEN: Well nourished, well developed, in no acute distress HEENT: normal Neck: no JVD, carotid bruits, or masses Cardiac: RRR; no murmurs, rubs, or gallops,no edema  Respiratory:  clear to auscultation bilaterally, normal work of breathing GI: soft, nontender, nondistended, + BS MS: no deformity or atrophy Skin: warm and dry, no  rash Neuro:  Strength and sensation are intact Psych: euthymic mood, full affect   EKG:  EKG is ordered today. The ekg ordered today demonstrates NSR with RBBB   Recent Labs: No results found for requested labs within last 365 days.    Lipid Panel No results found for: CHOL, TRIG, HDL, CHOLHDL, VLDL, LDLCALC, LDLDIRECT    Wt Readings from Last 3 Encounters:  06/27/14 236 lb 3.2 oz (107.14 kg)  06/26/13 239 lb (108.41 kg)  04/30/13 236 lb (107.049 kg)      Other studies Reviewed: Additional studies/ records that were reviewed today include: Reviewed a legal records. 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 Rule out ischemic origin   Current medicines are reviewed at length with the patient today.  The patient does not have concerns regarding medicines.  The following changes have been made:  no change  Labs/ tests ordered today include:  No orders of the defined types were placed in this encounter.     Disposition:   FU with HS in 1 years  Signed, Sinclair Grooms, MD  06/27/2014 11:03 AM    [address], [place], [place]  [address] Phone: [phone]; Fax: [phone]

## 2014-06-27 NOTE — Patient Instructions (Signed)
Medication Instructions:  Your physician recommends that you continue on your current medications as directed. Please refer to the Current Medication list given to you today.   Labwork: None   Testing/Procedures: Your physician has requested that you have en exercise stress myoview. For further information please visit [email or web address]. Please follow instruction sheet, as given.    Follow-Up: Your physician wants you to follow-up in: 1 year with Dr.Smith You will receive a reminder letter in the mail two months in advance. If you don't receive a letter, please call our office to schedule the follow-up appointment.   Any Other Special Instructions Will Be Listed Below (If Applicable).

## 2014-07-04 ENCOUNTER — Other Ambulatory Visit: Payer: Self-pay

## 2014-07-04 MED ORDER — NITROGLYCERIN 0.4 MG SL SUBL: 0.4000 mg | SUBLINGUAL_TABLET | SUBLINGUAL | Status: DC | PRN

## 2014-07-17 ENCOUNTER — Telehealth (HOSPITAL_COMMUNITY): Payer: Self-pay | Admitting: *Deleted

## 2014-07-17 NOTE — Telephone Encounter (Signed)
Patient given detailed instructions per Myocardial Perfusion Study Information Sheet for test on 07/18/14 at 0715. Patient verbalized understanding. Ashari Llewellyn, [place]

## 2014-07-18 ENCOUNTER — Ambulatory Visit (HOSPITAL_COMMUNITY): Payer: 59 | Attending: Cardiology

## 2014-07-18 DIAGNOSIS — I2510 Atherosclerotic heart disease of native coronary artery without angina pectoris: Secondary | ICD-10-CM | POA: Insufficient documentation

## 2014-07-18 LAB — MYOCARDIAL PERFUSION IMAGING
Estimated workload: 7 METS
Exercise duration (min): 6 min
Exercise duration (sec): 16 s
LV dias vol: 118 mL
LV sys vol: 61 mL
MPHR: 152 {beats}/min
Nuc Stress EF: 49 %
Peak BP: 212 mmHg
Peak HR: 134 {beats}/min
Percent HR: 89 %
Percent of predicted max HR: 88 %
RATE: 0.25
Rest HR: 64 {beats}/min
SDS: 1
SRS: 5
SSS: 6
Stage 1 DBP: 76 mmHg
Stage 1 Grade: 0 %
Stage 1 HR: 78 {beats}/min
Stage 1 SBP: 123 mmHg
Stage 1 Speed: 0 mph
Stage 2 DBP: 78 mmHg
Stage 2 Grade: 0 %
Stage 2 HR: 71 {beats}/min
Stage 2 SBP: 133 mmHg
Stage 2 Speed: 0 mph
Stage 3 Grade: 0 %
Stage 3 HR: 71 {beats}/min
Stage 3 Speed: 0 mph
Stage 4 DBP: 87 mmHg
Stage 4 Grade: 10 %
Stage 4 HR: 107 {beats}/min
Stage 4 SBP: 134 mmHg
Stage 4 Speed: 1.7 mph
Stage 5 DBP: 85 mmHg
Stage 5 Grade: 12 %
Stage 5 HR: 134 {beats}/min
Stage 5 SBP: 212 mmHg
Stage 5 Speed: 2.5 mph
Stage 6 DBP: 84 mmHg
Stage 6 Grade: 0 %
Stage 6 HR: 122 {beats}/min
Stage 6 SBP: 201 mmHg
Stage 6 Speed: 0 mph
Stage 7 DBP: 65 mmHg
Stage 7 Grade: 0 %
Stage 7 HR: 83 {beats}/min
Stage 7 SBP: 133 mmHg
Stage 7 Speed: 0 mph
TID: 1.03

## 2014-07-18 MED ORDER — TECHNETIUM TC 99M SESTAMIBI GENERIC - CARDIOLITE
33.0000 | Freq: Once | INTRAVENOUS | Status: AC | PRN
  Administered 2014-07-18: 33 via INTRAVENOUS

## 2014-07-18 MED ORDER — TECHNETIUM TC 99M SESTAMIBI GENERIC - CARDIOLITE
11.0000 | Freq: Once | INTRAVENOUS | Status: AC | PRN
  Administered 2014-07-18: 11 via INTRAVENOUS

## 2014-07-19 ENCOUNTER — Telehealth: Payer: Self-pay

## 2014-07-19 NOTE — Telephone Encounter (Signed)
-----   Message from Belva Crome, MD sent at 07/18/2014  5:10 PM EDT ----- The study has no major abnormality. It does show evidence of the prior injury suffered at the time of the heart attack. In absence of symptoms no further evaluation is needed at this time. He should remain active. He should report any symptoms of angina to [place].

## 2014-07-19 NOTE — Telephone Encounter (Signed)
Pt wife aware of myoview results with verbal understanding. The study has no major abnormality. It does show evidence of the prior injury suffered at the time of the heart attack. In absence of symptoms no further evaluation is needed at this time. He should remain active. He should report any symptoms of angina to [place]. Left message for pt to call back if he has any questions

## 2015-02-23 ENCOUNTER — Emergency Department (HOSPITAL_COMMUNITY)
Admission: EM | Admit: 2015-02-23 | Discharge: 2015-02-23 | Discharge status: Absent without leave | Payer: 59 | Source: Home / Self Care

## 2015-02-23 ENCOUNTER — Emergency Department (HOSPITAL_COMMUNITY)
Admission: EM | Admit: 2015-02-23 | Discharge: 2015-02-23 | Disposition: A | Discharge status: Home / Self Care | Payer: 59 | Attending: Emergency Medicine | Admitting: Emergency Medicine

## 2015-02-23 ENCOUNTER — Encounter (HOSPITAL_COMMUNITY): Payer: Self-pay | Admitting: Emergency Medicine

## 2015-02-23 DIAGNOSIS — I10 Essential (primary) hypertension: Secondary | ICD-10-CM | POA: Insufficient documentation

## 2015-02-23 DIAGNOSIS — K625 Hemorrhage of anus and rectum: Secondary | ICD-10-CM | POA: Diagnosis present

## 2015-02-23 DIAGNOSIS — E7800 Pure hypercholesterolemia, unspecified: Secondary | ICD-10-CM | POA: Diagnosis not present

## 2015-02-23 DIAGNOSIS — Z79899 Other long term (current) drug therapy: Secondary | ICD-10-CM | POA: Insufficient documentation

## 2015-02-23 DIAGNOSIS — Z7951 Long term (current) use of inhaled steroids: Secondary | ICD-10-CM | POA: Diagnosis not present

## 2015-02-23 DIAGNOSIS — Z9889 Other specified postprocedural states: Secondary | ICD-10-CM | POA: Diagnosis not present

## 2015-02-23 DIAGNOSIS — R197 Diarrhea, unspecified: Secondary | ICD-10-CM | POA: Diagnosis not present

## 2015-02-23 DIAGNOSIS — K644 Residual hemorrhoidal skin tags: Secondary | ICD-10-CM | POA: Diagnosis not present

## 2015-02-23 DIAGNOSIS — Z87891 Personal history of nicotine dependence: Secondary | ICD-10-CM | POA: Insufficient documentation

## 2015-02-23 DIAGNOSIS — J45909 Unspecified asthma, uncomplicated: Secondary | ICD-10-CM | POA: Diagnosis not present

## 2015-02-23 DIAGNOSIS — Z7982 Long term (current) use of aspirin: Secondary | ICD-10-CM | POA: Insufficient documentation

## 2015-02-23 DIAGNOSIS — I252 Old myocardial infarction: Secondary | ICD-10-CM | POA: Insufficient documentation

## 2015-02-23 DIAGNOSIS — M1990 Unspecified osteoarthritis, unspecified site: Secondary | ICD-10-CM | POA: Diagnosis not present

## 2015-02-23 LAB — CBC
HCT: 43.6 % (ref 39.0–52.0)
Hemoglobin: 14.3 g/dL (ref 13.0–17.0)
MCH: 30.7 pg (ref 26.0–34.0)
MCHC: 32.8 g/dL (ref 30.0–36.0)
MCV: 93.6 fL (ref 78.0–100.0)
Platelets: 207 10*3/uL (ref 150–400)
RBC: 4.66 MIL/uL (ref 4.22–5.81)
RDW: 14.8 % (ref 11.5–15.5)
WBC: 5.9 10*3/uL (ref 4.0–10.5)

## 2015-02-23 LAB — TYPE AND SCREEN
ABO/RH(D): O POS
Antibody Screen: NEGATIVE

## 2015-02-23 LAB — COMPREHENSIVE METABOLIC PANEL
ALT: 19 U/L (ref 17–63)
AST: 24 U/L (ref 15–41)
Albumin: 3.8 g/dL (ref 3.5–5.0)
Alkaline Phosphatase: 102 U/L (ref 38–126)
Anion gap: 9 (ref 5–15)
BUN: 28 mg/dL — ABNORMAL HIGH (ref 6–20)
CO2: 23 mmol/L (ref 22–32)
Calcium: 9.2 mg/dL (ref 8.9–10.3)
Chloride: 106 mmol/L (ref 101–111)
Creatinine, Ser: 1.45 mg/dL — ABNORMAL HIGH (ref 0.61–1.24)
GFR calc Af Amer: 55 mL/min — ABNORMAL LOW (ref >60)
GFR calc non Af Amer: 48 mL/min — ABNORMAL LOW (ref >60)
Glucose, Bld: 157 mg/dL — ABNORMAL HIGH (ref 65–99)
Potassium: 4.9 mmol/L (ref 3.5–5.1)
Sodium: 138 mmol/L (ref 135–145)
Total Bilirubin: 0.8 mg/dL (ref 0.3–1.2)
Total Protein: 6.4 g/dL — ABNORMAL LOW (ref 6.5–8.1)

## 2015-02-23 LAB — ABO/RH: ABO/RH(D): O POS

## 2015-02-23 LAB — POC OCCULT BLOOD, ED: Fecal Occult Bld: POSITIVE — AB

## 2015-02-23 MED ORDER — HYDROCORTISONE 2.5 % RE CREA
1.0000 | TOPICAL_CREAM | Freq: Two times a day (BID) | RECTAL | Status: DC
Start: 2015-02-23 — End: 2016-06-08

## 2015-02-23 NOTE — Discharge Instructions (Signed)
You were found to have a hemorrhoid which is likely causing the bleeding from your rectum.  Return with sudden passage of large amounts of blood with clots, light headedness, abdominal pain, or if the hemorrhoid becomes firm or very painful.  Make an appointment with your primary care physician as well as the physician who does your colonoscopies for reevaluation.  Drink lots of fluids and eat high fiber foods.  Your kidney function was up a little bit today and needs to be rechecked next week.  This may just be related to dehydration so make sure you drink at least 64 ounces of water a day.  Return with inability to drink or eat or with vomiting.  Hemorrhoids Hemorrhoids are swollen veins around the rectum or anus. There are two types of hemorrhoids:   Internal hemorrhoids. These occur in the veins just inside the rectum. They may poke through to the outside and become irritated and painful.  External hemorrhoids. These occur in the veins outside the anus and can be felt as a painful swelling or hard lump near the anus. CAUSES  Pregnancy.   Obesity.   Constipation or diarrhea.   Straining to have a bowel movement.   Sitting for long periods on the toilet.  Heavy lifting or other activity that caused you to strain.  Anal intercourse. SYMPTOMS   Pain.   Anal itching or irritation.   Rectal bleeding.   Fecal leakage.   Anal swelling.   One or more lumps around the anus.  DIAGNOSIS  Your caregiver may be able to diagnose hemorrhoids by visual examination. Other examinations or tests that may be performed include:   Examination of the rectal area with a gloved hand (digital rectal exam).   Examination of anal canal using a small tube (scope).   A blood test if you have lost a significant amount of blood.  A test to look inside the colon (sigmoidoscopy or colonoscopy). TREATMENT Most hemorrhoids can be treated at home. However, if symptoms do not seem to be  getting better or if you have a lot of rectal bleeding, your caregiver may perform a procedure to help make the hemorrhoids get smaller or remove them completely. Possible treatments include:   Placing a rubber band at the base of the hemorrhoid to cut off the circulation (rubber band ligation).   Injecting a chemical to shrink the hemorrhoid (sclerotherapy).   Using a tool to burn the hemorrhoid (infrared light therapy).   Surgically removing the hemorrhoid (hemorrhoidectomy).   Stapling the hemorrhoid to block blood flow to the tissue (hemorrhoid stapling).  HOME CARE INSTRUCTIONS   Eat foods with fiber, such as whole grains, beans, nuts, fruits, and vegetables. Ask your doctor about taking products with added fiber in them (fibersupplements).  Increase fluid intake. Drink enough water and fluids to keep your urine clear or pale yellow.   Exercise regularly.   Go to the bathroom when you have the urge to have a bowel movement. Do not wait.   Avoid straining to have bowel movements.   Keep the anal area dry and clean. Use wet toilet paper or moist towelettes after a bowel movement.   Medicated creams and suppositories may be used or applied as directed.   Only take over-the-counter or prescription medicines as directed by your caregiver.   Take warm sitz baths for 15-20 minutes, 3-4 times a day to ease pain and discomfort.   Place ice packs on the hemorrhoids if they are tender and  swollen. Using ice packs between sitz baths may be helpful.   Put ice in a plastic bag.   Place a towel between your skin and the bag.   Leave the ice on for 15-20 minutes, 3-4 times a day.   Do not use a donut-shaped pillow or sit on the toilet for long periods. This increases blood pooling and pain.  SEEK MEDICAL CARE IF:  You have increasing pain and swelling that is not controlled by treatment or medicine.  You have uncontrolled bleeding.  You have difficulty or you are  unable to have a bowel movement.  You have pain or inflammation outside the area of the hemorrhoids. MAKE SURE YOU:  Understand these instructions.  Will watch your condition.  Will get help right away if you are not doing well or get worse.   This information is not intended to replace advice given to you by your health care provider. Make sure you discuss any questions you have with your health care provider.   Document Released: 02/21/2000 Document Revised: 02/10/2012 Document Reviewed: 12/29/2011 Elsevier Interactive Patient Education [employer].

## 2015-02-23 NOTE — ED Notes (Signed)
Pt. Stated, I've had some blood in my bowels for a couple of days.  Some bouts of diarrhea.

## 2015-02-23 NOTE — ED Provider Notes (Signed)
CSN: [id number]     Arrival date & time 02/23/15  1418 History   First MD Initiated Contact with Patient 02/23/15 1705     Chief Complaint  Patient presents with  . Rectal Bleeding  . Diarrhea     (Consider location/radiation/quality/duration/timing/severity/associated sxs/prior Treatment) HPI Comments: 69 y.o. Male with history of MI, asthma, arthritis, HTN, high cholesterol presents for blood in his stool.  The patient reports that he has a family history of colon cancer so has had 3 colonoscopies in the past with the last being towards the beginning of this year.  He has had polyps removed but the findings were always benign.  Over the last few days he has had loose stools a few times a day and has felt very thirsty.  He is able to eat and drink without issue but does say he will get a queasy feeling and then feel like he needs to have a bowel movement.  He states that he has had bright red blood in his stool and noted some on toilet paper even when not passing stool.  Denies abdominal pain, vomiting, fever, chills.  Denies history of fissure or hemorrhoid.  Patient is a 69 y.o. male presenting with hematochezia and diarrhea.  Rectal Bleeding Associated symptoms: no abdominal pain, no dizziness, no fever and no vomiting   Diarrhea Associated symptoms: no abdominal pain, no diaphoresis, no fever, no headaches, no myalgias and no vomiting     Past Medical History  Diagnosis Date  . Myocardial infarction [facility]) 2004    Dr. Tamala Julian, at [place], yearly visits  . Asthma   . Arthritis   . Hypertension   . High cholesterol    Past Surgical History  Procedure Laterality Date  . Tonsillectomy    . Colonoscopy    . Cardiovascular stress test  2011  . US echocardiography    . Cardiac catheterization  2004    with 2 stents  . Sinus endo w/fusion  02/17/2011    Procedure: ENDOSCOPIC SINUS SURGERY WITH FUSION NAVIGATION;  Surgeon: Delsa Bern;  Location: [place];  Service: ENT;  Laterality:  N/A;  . Polypectomy  02/17/2011    Procedure: POLYPECTOMY NASAL;  Surgeon: Delsa Bern;  Location: [facility] OR;  Service: ENT;  Laterality: N/A;   Family History  Problem Relation Age of Onset  . Heart attack Father    Social History  Substance Use Topics  . Smoking status: Former Research scientist (life sciences)  . Smokeless tobacco: None  . Alcohol Use: 3.2 oz/week    2 Cans of beer, 4 Standard drinks or equivalent per week     Comment: 5-6 drinks/week    Review of Systems  Constitutional: Negative for fever, diaphoresis, appetite change and fatigue.  HENT: Negative for congestion, postnasal drip, rhinorrhea and sinus pressure.   Eyes: Negative for pain and redness.  Respiratory: Negative for cough, chest tightness and shortness of breath.   Cardiovascular: Negative for chest pain and palpitations.  Gastrointestinal: Positive for diarrhea, blood in stool and hematochezia. Negative for nausea, vomiting, abdominal pain, constipation, abdominal distention and rectal pain.  Genitourinary: Negative for dysuria, urgency, frequency and hematuria.  Musculoskeletal: Negative for myalgias and back pain.  Skin: Negative for rash.  Neurological: Negative for dizziness, weakness and headaches.  Hematological: Does not bruise/bleed easily.      Allergies  Beef-derived products; Chicken protein; and Peanut-containing drug products  Home Medications   Prior to Admission medications   Medication Sig Start Date End Date Taking?  Authorizing Provider  albuterol (PROVENTIL HFA;VENTOLIN HFA) 108 (90 BASE) MCG/ACT inhaler Inhale 1-2 puffs into the lungs every 6 (six) hours as needed. For shortness of breath     Historical Provider, MD  aspirin 325 MG tablet Take 325 mg by mouth daily.    Historical Provider, MD  atorvastatin (LIPITOR) 20 MG tablet Take 20 mg by mouth daily.      Historical Provider, MD  carvedilol (COREG) 12.5 MG tablet Take 12.5 mg by mouth 2 (two) times daily.      Historical Provider, MD    Fluticasone Furoate-Vilanterol 200-25 MCG/INH AEPB Inhale 200 mcg into the lungs daily.    Historical Provider, MD  ipratropium-albuterol (DUONEB) 0.5-2.5 (3) MG/3ML SOLN Take 3 mLs by nebulization 3 (three) times daily. When feeling better change instructions to take every 6 hours if needed for wheezing or shortness of breath 05/03/13   Melton Alar, PA-C  lisinopril-hydrochlorothiazide (PRINZIDE,ZESTORETIC) 20-12.5 MG per tablet Take 1 tablet by mouth every evening.     Historical Provider, MD  mometasone (NASONEX) 50 MCG/ACT nasal spray Place 2 sprays into the nose 2 (two) times daily.    Historical Provider, MD  mometasone-formoterol (DULERA) 100-5 MCG/ACT AERO Inhale 2 puffs into the lungs 2 (two) times daily. 05/03/13   Bobby Rumpf York, PA-C  montelukast (SINGULAIR) 10 MG tablet Take 10 mg by mouth at bedtime.    Historical Provider, MD  nitroGLYCERIN (NITROSTAT) 0.4 MG SL tablet Place 1 tablet (0.4 mg total) under the tongue every 5 (five) minutes as needed. For chest pain 07/04/14   Belva Crome, MD  omeprazole (PRILOSEC) 20 MG capsule Take 20 mg by mouth every evening.     Historical Provider, MD   BP 178/97 mmHg  Pulse 90  Temp(Src) 98.6 F (37 C) (Oral)  Resp 18  Ht 5\' 8"  (1.727 m)  Wt 230 lb (104.327 kg)  BMI 34.98 kg/m2  SpO2 99% Physical Exam  Constitutional: He is oriented to person, place, and time. He appears well-developed and well-nourished. No distress.  HENT:  Head: Normocephalic and atraumatic.  Right Ear: External ear normal.  Left Ear: External ear normal.  Mouth/Throat: Oropharynx is clear and moist. No oropharyngeal exudate.  Eyes: EOM are normal. Pupils are equal, round, and reactive to light.  Neck: Normal range of motion. Neck supple.  Cardiovascular: Normal rate, regular rhythm, normal heart sounds and intact distal pulses.   No murmur heard. Pulmonary/Chest: Effort normal. No respiratory distress. He has no wheezes. He has no rales.  Abdominal: Soft. He  exhibits no distension. There is no tenderness.  Genitourinary: Rectal exam shows external hemorrhoid (1 large posterior, compressible, non tender hemorrhoid). Rectal exam shows no fissure.     Musculoskeletal: He exhibits no edema.  Neurological: He is alert and oriented to person, place, and time.  Skin: Skin is warm and dry. No rash noted. He is not diaphoretic.  Vitals reviewed.   ED Course  Procedures (including critical care time) Labs Review Labs Reviewed  COMPREHENSIVE METABOLIC PANEL - Abnormal; Notable for the following:    Glucose, Bld 157 (*)    BUN 28 (*)    Creatinine, Ser 1.45 (*)    Total Protein 6.4 (*)    GFR calc non Af Amer 48 (*)    GFR calc Af Amer 55 (*)    All other components within normal limits  CBC  POC OCCULT BLOOD, ED  TYPE AND SCREEN  ABO/RH    Imaging Review No  results found. I have personally reviewed and evaluated these images and lab results as part of my medical decision-making.   EKG Interpretation None      MDM  Patient seen and evaluated in stable condition.  Large, non thrombosed external hemorrhoid on examination. No blood in rectal vault.  Patient with HTN but has history of such.  Patient otherwise well appearing.  Patient tolerating PO and was given water to drink.  Patient offered IV fluids but said he felt he could hydrate himself orally.  Was informed of his elevated Cr.  Hb stable and vitals stable.  Patient with good follow up with PCP and GI.  He was discharged home in stable condition. Final diagnoses:  None    1. External hemorrhoid    Harvel Quale, MD 02/24/15 [phone]

## 2015-03-14 DIAGNOSIS — K648 Other hemorrhoids: Secondary | ICD-10-CM | POA: Diagnosis not present

## 2015-03-14 DIAGNOSIS — K625 Hemorrhage of anus and rectum: Secondary | ICD-10-CM | POA: Diagnosis not present

## 2015-03-14 DIAGNOSIS — R143 Flatulence: Secondary | ICD-10-CM | POA: Diagnosis not present

## 2015-03-14 DIAGNOSIS — R141 Gas pain: Secondary | ICD-10-CM | POA: Diagnosis not present

## 2015-04-02 DIAGNOSIS — I2510 Atherosclerotic heart disease of native coronary artery without angina pectoris: Secondary | ICD-10-CM | POA: Diagnosis not present

## 2015-04-02 DIAGNOSIS — K589 Irritable bowel syndrome without diarrhea: Secondary | ICD-10-CM | POA: Diagnosis not present

## 2015-04-02 DIAGNOSIS — J4540 Moderate persistent asthma, uncomplicated: Secondary | ICD-10-CM | POA: Diagnosis not present

## 2015-04-02 DIAGNOSIS — K648 Other hemorrhoids: Secondary | ICD-10-CM | POA: Diagnosis not present

## 2015-04-02 DIAGNOSIS — I252 Old myocardial infarction: Secondary | ICD-10-CM | POA: Diagnosis not present

## 2015-04-02 DIAGNOSIS — Z79899 Other long term (current) drug therapy: Secondary | ICD-10-CM | POA: Diagnosis not present

## 2015-04-02 DIAGNOSIS — I119 Hypertensive heart disease without heart failure: Secondary | ICD-10-CM | POA: Diagnosis not present

## 2015-04-02 DIAGNOSIS — E7800 Pure hypercholesterolemia, unspecified: Secondary | ICD-10-CM | POA: Diagnosis not present

## 2015-04-02 DIAGNOSIS — Z125 Encounter for screening for malignant neoplasm of prostate: Secondary | ICD-10-CM | POA: Diagnosis not present

## 2015-05-12 DIAGNOSIS — H6692 Otitis media, unspecified, left ear: Secondary | ICD-10-CM | POA: Diagnosis not present

## 2015-05-12 DIAGNOSIS — H6123 Impacted cerumen, bilateral: Secondary | ICD-10-CM | POA: Diagnosis not present

## 2015-05-12 DIAGNOSIS — H9202 Otalgia, left ear: Secondary | ICD-10-CM | POA: Diagnosis not present

## 2015-08-06 DIAGNOSIS — Z79891 Long term (current) use of opiate analgesic: Secondary | ICD-10-CM | POA: Diagnosis not present

## 2015-08-06 DIAGNOSIS — Z1211 Encounter for screening for malignant neoplasm of colon: Secondary | ICD-10-CM | POA: Diagnosis not present

## 2015-08-06 DIAGNOSIS — I2510 Atherosclerotic heart disease of native coronary artery without angina pectoris: Secondary | ICD-10-CM | POA: Diagnosis not present

## 2015-08-06 DIAGNOSIS — I252 Old myocardial infarction: Secondary | ICD-10-CM | POA: Diagnosis not present

## 2015-08-06 DIAGNOSIS — J4540 Moderate persistent asthma, uncomplicated: Secondary | ICD-10-CM | POA: Diagnosis not present

## 2015-08-06 DIAGNOSIS — K589 Irritable bowel syndrome without diarrhea: Secondary | ICD-10-CM | POA: Diagnosis not present

## 2015-08-06 DIAGNOSIS — I119 Hypertensive heart disease without heart failure: Secondary | ICD-10-CM | POA: Diagnosis not present

## 2015-08-06 DIAGNOSIS — J441 Chronic obstructive pulmonary disease with (acute) exacerbation: Secondary | ICD-10-CM | POA: Diagnosis not present

## 2015-08-06 DIAGNOSIS — E7800 Pure hypercholesterolemia, unspecified: Secondary | ICD-10-CM | POA: Diagnosis not present

## 2015-11-14 IMAGING — NM NM MISC PROCEDURE
5 series · 30 of 30 positions shown · non-contrast
Comparison: none

[Series 1: wbr_s-card_st stress-sum-em · 6.4mm · 6.40mm/px · 6 of 23 frames shown]
[frame 2/23]
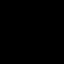
[frame 6/23]
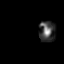
[frame 10/23]
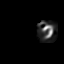
[frame 14/23]
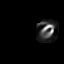
[frame 18/23]
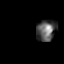
[frame 22/23]
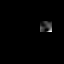

[Series 1: wbr_s-card_st stress-gsp · 6.4mm · 6.40mm/px · 6 of 184 frames shown]
[frame 16/184]
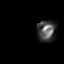
[frame 46/184]
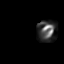
[frame 77/184]
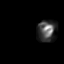
[frame 108/184]
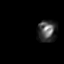
[frame 138/184]
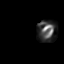
[frame 169/184]
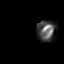

[Series 1: stress-sum-em · 6.40mm/px · 6 of 64 frames shown]
[frame 6/64]
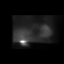
[frame 16/64]
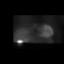
[frame 27/64]
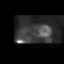
[frame 38/64]
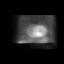
[frame 48/64]
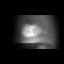
[frame 59/64]
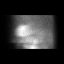

[Series 1: stress-gsp · 6.40mm/px · 6 of 509 frames shown]
[frame 43/509]
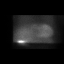
[frame 127/509]
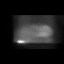
[frame 212/509]
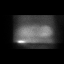
[frame 297/509]
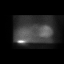
[frame 382/509]
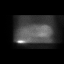
[frame 467/509]
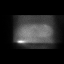

[Series 1: rest · 6.40mm/px · 6 of 64 frames shown]
[frame 6/64]
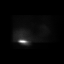
[frame 16/64]
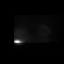
[frame 27/64]
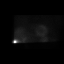
[frame 38/64]
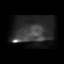
[frame 48/64]
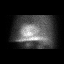
[frame 59/64]
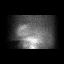

[30 of 30 positions shown; findings below may reference images not displayed]

Canned report from images found in remote index.

Refer to host system for actual result text.

## 2015-12-31 DIAGNOSIS — H524 Presbyopia: Secondary | ICD-10-CM | POA: Diagnosis not present

## 2016-01-14 DIAGNOSIS — I2510 Atherosclerotic heart disease of native coronary artery without angina pectoris: Secondary | ICD-10-CM | POA: Diagnosis not present

## 2016-01-14 DIAGNOSIS — E7800 Pure hypercholesterolemia, unspecified: Secondary | ICD-10-CM | POA: Diagnosis not present

## 2016-01-14 DIAGNOSIS — K580 Irritable bowel syndrome with diarrhea: Secondary | ICD-10-CM | POA: Diagnosis not present

## 2016-01-14 DIAGNOSIS — J441 Chronic obstructive pulmonary disease with (acute) exacerbation: Secondary | ICD-10-CM | POA: Diagnosis not present

## 2016-01-14 DIAGNOSIS — Z79899 Other long term (current) drug therapy: Secondary | ICD-10-CM | POA: Diagnosis not present

## 2016-01-14 DIAGNOSIS — I252 Old myocardial infarction: Secondary | ICD-10-CM | POA: Diagnosis not present

## 2016-01-14 DIAGNOSIS — Z6829 Body mass index (BMI) 29.0-29.9, adult: Secondary | ICD-10-CM | POA: Diagnosis not present

## 2016-01-14 DIAGNOSIS — J4540 Moderate persistent asthma, uncomplicated: Secondary | ICD-10-CM | POA: Diagnosis not present

## 2016-01-14 DIAGNOSIS — I119 Hypertensive heart disease without heart failure: Secondary | ICD-10-CM | POA: Diagnosis not present

## 2016-05-18 DIAGNOSIS — I119 Hypertensive heart disease without heart failure: Secondary | ICD-10-CM | POA: Diagnosis not present

## 2016-05-18 DIAGNOSIS — Z79899 Other long term (current) drug therapy: Secondary | ICD-10-CM | POA: Diagnosis not present

## 2016-05-18 DIAGNOSIS — K589 Irritable bowel syndrome without diarrhea: Secondary | ICD-10-CM | POA: Diagnosis not present

## 2016-05-18 DIAGNOSIS — E669 Obesity, unspecified: Secondary | ICD-10-CM | POA: Diagnosis not present

## 2016-05-18 DIAGNOSIS — J4540 Moderate persistent asthma, uncomplicated: Secondary | ICD-10-CM | POA: Diagnosis not present

## 2016-05-18 DIAGNOSIS — J441 Chronic obstructive pulmonary disease with (acute) exacerbation: Secondary | ICD-10-CM | POA: Diagnosis not present

## 2016-05-18 DIAGNOSIS — Z6831 Body mass index (BMI) 31.0-31.9, adult: Secondary | ICD-10-CM | POA: Diagnosis not present

## 2016-05-18 DIAGNOSIS — I2510 Atherosclerotic heart disease of native coronary artery without angina pectoris: Secondary | ICD-10-CM | POA: Diagnosis not present

## 2016-05-18 DIAGNOSIS — E7800 Pure hypercholesterolemia, unspecified: Secondary | ICD-10-CM | POA: Diagnosis not present

## 2016-05-18 DIAGNOSIS — I252 Old myocardial infarction: Secondary | ICD-10-CM | POA: Diagnosis not present

## 2016-06-08 ENCOUNTER — Ambulatory Visit (HOSPITAL_COMMUNITY)
Admission: EM | Admit: 2016-06-08 | Discharge: 2016-06-08 | Disposition: A | Discharge status: Home / Self Care | Payer: PPO | Attending: Family Medicine | Admitting: Family Medicine

## 2016-06-08 ENCOUNTER — Encounter (HOSPITAL_COMMUNITY): Payer: Self-pay | Admitting: Emergency Medicine

## 2016-06-08 DIAGNOSIS — T783XXA Angioneurotic edema, initial encounter: Secondary | ICD-10-CM

## 2016-06-08 DIAGNOSIS — R05 Cough: Secondary | ICD-10-CM

## 2016-06-08 DIAGNOSIS — R220 Localized swelling, mass and lump, head: Secondary | ICD-10-CM

## 2016-06-08 MED ORDER — DIPHENHYDRAMINE HCL 50 MG/ML IJ SOLN
INTRAMUSCULAR | Status: AC
  Filled 2016-06-08: qty 1

## 2016-06-08 MED ORDER — DIPHENHYDRAMINE HCL 50 MG/ML IJ SOLN: 50.0000 mg | Freq: Once | INTRAMUSCULAR | Status: DC

## 2016-06-08 MED ORDER — METHYLPREDNISOLONE ACETATE 80 MG/ML IJ SUSP
80.0000 mg | Freq: Once | INTRAMUSCULAR | Status: AC
  Administered 2016-06-08: 80 mg via INTRAMUSCULAR

## 2016-06-08 MED ORDER — DIPHENHYDRAMINE HCL 50 MG/ML IJ SOLN
25.0000 mg | Freq: Once | INTRAMUSCULAR | Status: AC
  Administered 2016-06-08: 25 mg via INTRAMUSCULAR

## 2016-06-08 MED ORDER — PREDNISONE 50 MG PO TABS: ORAL_TABLET | ORAL | 0 refills | Status: DC

## 2016-06-08 MED ORDER — METHYLPREDNISOLONE ACETATE 80 MG/ML IJ SUSP
INTRAMUSCULAR | Status: AC
  Filled 2016-06-08: qty 1

## 2016-06-08 NOTE — ED Provider Notes (Signed)
CSN: [id number]     Arrival date & time 06/08/16  1657 History   First MD Initiated Contact with Patient 06/08/16 1752     Chief Complaint  Patient presents with  . Angioedema   (Consider location/radiation/quality/duration/timing/severity/associated sxs/prior Treatment) 71 year old male presents to clinic for evaluation of swelling to his face and mouth. He states he has seasonal allergies, however he denies any drug allergies, food allergies, or other known sources of allergies. He's had no new detergents, new soaps, new foods, or new medications started. States the symptoms started this morning when he woke up. He does take an ACE inhibitor, lisinopril, however he states he's been on this medication for some time, has not had any complications. He is also complaining of a cough, states he's had this cough for more than one month, self treating with Mucinex DM with minimal relief.    Allergic Reaction  Presenting symptoms: swelling   Presenting symptoms: no difficulty swallowing and no wheezing   Severity:  Moderate Duration:  12 hours Prior allergic episodes:  No prior episodes Context: not chemicals, not dairy/milk products, not food allergies, not grass, not medications and not new detergents/soaps   Relieved by:  Nothing Worsened by:  Nothing Ineffective treatments:  None tried   Past Medical History:  Diagnosis Date  . Arthritis   . Asthma   . High cholesterol   . Hypertension   . Myocardial infarction 2004   Dr. Tamala Julian, at [place], yearly visits   Past Surgical History:  Procedure Laterality Date  . CARDIAC CATHETERIZATION  2004   with 2 stents  . CARDIOVASCULAR STRESS TEST  2011  . COLONOSCOPY    . POLYPECTOMY  02/17/2011   Procedure: POLYPECTOMY NASAL;  Surgeon: Delsa Bern;  Location: [facility];  Service: ENT;  Laterality: N/A;  . SINUS ENDO W/FUSION  02/17/2011   Procedure: ENDOSCOPIC SINUS SURGERY WITH FUSION NAVIGATION;  Surgeon: Delsa Bern;  Location: [facility]  OR;  Service: ENT;  Laterality: N/A;  . TONSILLECTOMY    . US ECHOCARDIOGRAPHY     Family History  Problem Relation Age of Onset  . Heart attack Father    Social History  Substance Use Topics  . Smoking status: Former Research scientist (life sciences)  . Smokeless tobacco: Not on file  . Alcohol use 3.2 oz/week    2 Cans of beer, 4 Standard drinks or equivalent per week     Comment: 5-6 drinks/week    Review of Systems  Constitutional: Negative for chills, fatigue and fever.  HENT: Positive for facial swelling. Negative for congestion, postnasal drip, rhinorrhea, sinus pressure, sore throat and trouble swallowing.   Eyes: Negative for redness and itching.  Respiratory: Negative for cough, choking, chest tightness, shortness of breath and wheezing.   Cardiovascular: Negative for chest pain and palpitations.  Gastrointestinal: Negative for abdominal pain, constipation, diarrhea, nausea and vomiting.  Musculoskeletal: Negative for back pain, neck pain and neck stiffness.  Skin: Negative for color change and pallor.  Neurological: Negative for dizziness, weakness and light-headedness.  All other systems reviewed and are negative.   Allergies  Beef-derived products; Chicken protein; and Peanut-containing drug products  Home Medications   Prior to Admission medications   Medication Sig Start Date End Date Taking? Authorizing Provider  aspirin 325 MG tablet Take 325 mg by mouth daily.   Yes Historical Provider, MD  atorvastatin (LIPITOR) 20 MG tablet Take 20 mg by mouth daily.     Yes Historical Provider, MD  carvedilol (COREG) 12.5 MG  tablet Take 12.5 mg by mouth 2 (two) times daily.     Yes Historical Provider, MD  ipratropium-albuterol (DUONEB) 0.5-2.5 (3) MG/3ML SOLN Take 3 mLs by nebulization 3 (three) times daily. When feeling better change instructions to take every 6 hours if needed for wheezing or shortness of breath 05/03/13  Yes Bobby Rumpf York, PA-C  lisinopril-hydrochlorothiazide  (PRINZIDE,ZESTORETIC) 20-12.5 MG per tablet Take 1 tablet by mouth every evening.    Yes Historical Provider, MD  mometasone (NASONEX) 50 MCG/ACT nasal spray Place 2 sprays into the nose 2 (two) times daily.   Yes Historical Provider, MD  montelukast (SINGULAIR) 10 MG tablet Take 10 mg by mouth at bedtime.   Yes Historical Provider, MD  omeprazole (PRILOSEC) 20 MG capsule Take 20 mg by mouth every evening.    Yes Historical Provider, MD  nitroGLYCERIN (NITROSTAT) 0.4 MG SL tablet Place 1 tablet (0.4 mg total) under the tongue every 5 (five) minutes as needed. For chest pain 07/04/14   Belva Crome, MD  predniSONE (DELTASONE) 50 MG tablet Take 1 tablet daily with food 06/08/16   Barnet Glasgow, NP   Meds Ordered and Administered this Visit   Medications  methylPREDNISolone acetate (DEPO-MEDROL) injection 80 mg (80 mg Intramuscular Given 06/08/16 1816)  diphenhydrAMINE (BENADRYL) injection 25 mg (25 mg Intramuscular Given 06/08/16 1816)    BP (!) 155/90 (BP Location: Right Arm) Comment: notified cma  Pulse 88   Temp 98.8 F (37.1 C) (Oral)   Resp 16   SpO2 96%  No data found.   Physical Exam  Constitutional: He appears well-developed and well-nourished. No distress.  HENT:  Head: Normocephalic and atraumatic.    Right Ear: External ear normal.  Left Ear: External ear normal.  Mouth/Throat: Oropharynx is clear and moist and mucous membranes are normal. Tonsils are 0 on the right. Tonsils are 0 on the left.  Notable swelling to the lips, no urticaria noted  Eyes: Conjunctivae are normal. Pupils are equal, round, and reactive to light.  Neck: Normal range of motion. Neck supple. No JVD present.  Cardiovascular: Normal rate and regular rhythm.   Pulmonary/Chest: Effort normal and breath sounds normal. No respiratory distress. He has no wheezes.  Abdominal: Soft. Bowel sounds are normal.  Musculoskeletal: He exhibits no edema or tenderness.  Lymphadenopathy:    He has no cervical  adenopathy.  Neurological: He is alert.  Skin: Skin is warm and dry. Capillary refill takes less than 2 seconds. He is not diaphoretic.  Psychiatric: He has a normal mood and affect. His behavior is normal.  Nursing note and vitals reviewed.   Urgent Care Course     Procedures (including critical care time)  Labs Review Labs Reviewed - No data to display  Imaging Review No results found.      MDM   1. Angioedema, initial encounter    Recommended discontinuation of the lisinopril, given Benadryl and Depo-Medrol clinic, encouraged him to follow up with his primary care provider for reevaluation, and medication management. Discharged home with strict follow-up guidelines, encouraged over-the-counter use of Benadryl, and given a prescription for prednisone.     Barnet Glasgow, NP 06/08/16 1844

## 2016-06-08 NOTE — Discharge Instructions (Signed)
I recommend you stop taking your lisinopril, and that you call your doctor tomorrow morning and discuss this possible reaction with him. Furthermore I would recommend you schedule follow-up appointment to see him for evaluation.  We have given you Benadryl, and an injection of Depo-Medrol here in the clinic, I'd recommend you continue to take over-the-counter Benadryl 2 tablets or 50 mg every 6 hours, not to exceed 300 mg and any 24-hour period. I have also prescribed steroids as well, take one tablet daily with food. If at any time your swelling gets worse, her cough gets worse, have any wheezing, shortness of breath, or other symptoms, go to the emergency room as soon as possible.

## 2016-06-08 NOTE — ED Triage Notes (Signed)
The patient presented to the [facility] with a complaint of swelling to his lips that started this am.

## 2016-09-14 DIAGNOSIS — I252 Old myocardial infarction: Secondary | ICD-10-CM | POA: Diagnosis not present

## 2016-09-14 DIAGNOSIS — K580 Irritable bowel syndrome with diarrhea: Secondary | ICD-10-CM | POA: Diagnosis not present

## 2016-09-14 DIAGNOSIS — E7800 Pure hypercholesterolemia, unspecified: Secondary | ICD-10-CM | POA: Diagnosis not present

## 2016-09-14 DIAGNOSIS — I119 Hypertensive heart disease without heart failure: Secondary | ICD-10-CM | POA: Diagnosis not present

## 2016-09-14 DIAGNOSIS — Z6831 Body mass index (BMI) 31.0-31.9, adult: Secondary | ICD-10-CM | POA: Diagnosis not present

## 2016-09-14 DIAGNOSIS — Z79899 Other long term (current) drug therapy: Secondary | ICD-10-CM | POA: Diagnosis not present

## 2016-09-14 DIAGNOSIS — J441 Chronic obstructive pulmonary disease with (acute) exacerbation: Secondary | ICD-10-CM | POA: Diagnosis not present

## 2016-09-14 DIAGNOSIS — Z125 Encounter for screening for malignant neoplasm of prostate: Secondary | ICD-10-CM | POA: Diagnosis not present

## 2016-09-14 DIAGNOSIS — Z129 Encounter for screening for malignant neoplasm, site unspecified: Secondary | ICD-10-CM | POA: Diagnosis not present

## 2016-09-14 DIAGNOSIS — J4540 Moderate persistent asthma, uncomplicated: Secondary | ICD-10-CM | POA: Diagnosis not present

## 2016-09-14 DIAGNOSIS — I2510 Atherosclerotic heart disease of native coronary artery without angina pectoris: Secondary | ICD-10-CM | POA: Diagnosis not present

## 2016-09-30 ENCOUNTER — Encounter (HOSPITAL_COMMUNITY): Payer: Self-pay | Admitting: Emergency Medicine

## 2016-09-30 ENCOUNTER — Ambulatory Visit (HOSPITAL_COMMUNITY)
Admission: EM | Admit: 2016-09-30 | Discharge: 2016-09-30 | Disposition: A | Discharge status: Home / Self Care | Payer: PPO | Attending: Family Medicine | Admitting: Family Medicine

## 2016-09-30 DIAGNOSIS — H6123 Impacted cerumen, bilateral: Secondary | ICD-10-CM | POA: Diagnosis not present

## 2016-09-30 NOTE — ED Triage Notes (Signed)
Pt here for right ear fullness onset 1 week associated w/decreased hearing  Voices no other concerns or pain.   A&O x4... NAD... Ambulatory

## 2016-09-30 NOTE — ED Provider Notes (Signed)
CSN: [id number]     Arrival date & time 09/30/16  1154 History   None    Chief Complaint  Patient presents with  . Ear Fullness   (Consider location/radiation/quality/duration/timing/severity/associated sxs/prior Treatment) Richard [name] is a 71 y.o. male who presents to the [facility] urgent care with a chief complaint of ear wax buildup X1 week    The history is provided by the patient.  Otalgia  Location:  Bilateral Behind ear:  No abnormality Quality:  Pressure Severity:  Mild Onset quality:  Gradual Duration:  1 week Timing:  Constant Progression:  Worsening Chronicity:  New Context comment:  Ear wax buildup Relieved by:  Nothing Worsened by:  Nothing Ineffective treatments:  None tried Associated symptoms: no cough, no ear discharge, no fever, no headaches and no tinnitus     Past Medical History:  Diagnosis Date  . Arthritis   . Asthma   . High cholesterol   . Hypertension   . Myocardial infarction [facility]) 2004   Dr. Tamala Julian, at [place], yearly visits   Past Surgical History:  Procedure Laterality Date  . CARDIAC CATHETERIZATION  2004   with 2 stents  . CARDIOVASCULAR STRESS TEST  2011  . COLONOSCOPY    . POLYPECTOMY  02/17/2011   Procedure: POLYPECTOMY NASAL;  Surgeon: Delsa Bern;  Location: [facility];  Service: ENT;  Laterality: N/A;  . SINUS ENDO W/FUSION  02/17/2011   Procedure: ENDOSCOPIC SINUS SURGERY WITH FUSION NAVIGATION;  Surgeon: Delsa Bern;  Location: [facility] OR;  Service: ENT;  Laterality: N/A;  . TONSILLECTOMY    . US ECHOCARDIOGRAPHY     Family History  Problem Relation Age of Onset  . Heart attack Father    Social History  Substance Use Topics  . Smoking status: Former Research scientist (life sciences)  . Smokeless tobacco: Never Used  . Alcohol use 3.2 oz/week    2 Cans of beer, 4 Standard drinks or equivalent per week     Comment: 5-6 drinks/week    Review of Systems  Constitutional: Negative for chills and fever.  HENT: Positive for ear pain.  Negative for ear discharge and tinnitus.   Respiratory: Negative for cough and shortness of breath.   Cardiovascular: Negative.   Gastrointestinal: Negative.   Musculoskeletal: Negative.   Neurological: Negative for light-headedness and headaches.    Allergies  Beef-derived products; Chicken protein; and Peanut-containing drug products  Home Medications   Prior to Admission medications   Medication Sig Start Date End Date Taking? Authorizing Provider  albuterol (PROVENTIL HFA;VENTOLIN HFA) 108 (90 Base) MCG/ACT inhaler Inhale into the lungs every 6 (six) hours as needed for wheezing or shortness of breath.   Yes [provider]  aspirin 325 MG tablet Take 325 mg by mouth daily.   Yes [provider]  atorvastatin (LIPITOR) 20 MG tablet Take 20 mg by mouth daily.     Yes [provider]  carvedilol (COREG) 12.5 MG tablet Take 12.5 mg by mouth 2 (two) times daily.     Yes [provider]  fluticasone furoate-vilanterol (BREO ELLIPTA) 200-25 MCG/INH AEPB Inhale 1 puff into the lungs daily.   Yes [provider]  lisinopril-hydrochlorothiazide (PRINZIDE,ZESTORETIC) 20-12.5 MG per tablet Take 1 tablet by mouth every evening.    Yes [provider]  montelukast (SINGULAIR) 10 MG tablet Take 10 mg by mouth at bedtime.   Yes [provider]  omeprazole (PRILOSEC) 20 MG capsule Take 20 mg by mouth every evening.  Yes [provider]  ipratropium-albuterol (DUONEB) 0.5-2.5 (3) MG/3ML SOLN Take 3 mLs by nebulization 3 (three) times daily. When feeling better change instructions to take every 6 hours if needed for wheezing or shortness of breath 05/03/13   Dellinger, Haynes Dage L, PA-C  mometasone (NASONEX) 50 MCG/ACT nasal spray Place 2 sprays into the nose 2 (two) times daily.    [provider]  nitroGLYCERIN (NITROSTAT) 0.4 MG SL tablet Place 1 tablet (0.4 mg total) under the tongue every 5 (five) minutes as needed. For  chest pain 07/04/14   Belva Crome, MD  predniSONE (DELTASONE) 50 MG tablet Take 1 tablet daily with food 06/08/16   Barnet Glasgow, NP   Meds Ordered and Administered this Visit  Medications - No data to display  BP (!) 153/84 (BP Location: Left Arm)   Pulse 80   Temp 98.7 F (37.1 C) (Oral)   Resp 20   SpO2 97%  No data found.   Physical Exam  Constitutional: He is oriented to person, place, and time. He appears well-developed and well-nourished. No distress.  HENT:  Head: Normocephalic and atraumatic.  Right Ear: External ear normal.  Left Ear: External ear normal.  Bilateral cerumen impaction  Neck: Normal range of motion.  Neurological: He is alert and oriented to person, place, and time.  Skin: Skin is warm and dry. Capillary refill takes less than 2 seconds. He is not diaphoretic.  Psychiatric: He has a normal mood and affect. His behavior is normal.  Nursing note and vitals reviewed.   Urgent Care Course     Procedures (including critical care time)  Labs Review Labs Reviewed - No data to display  Imaging Review No results found.   Visual Acuity Review  Right Eye Distance:   Left Eye Distance:   Bilateral Distance:    Right Eye Near:   Left Eye Near:    Bilateral Near:         MDM   1. Bilateral impacted cerumen    Ears cleaned in clinic, TM intact without trauma,  follow up with PCP as needed.    Barnet Glasgow, NP 09/30/16 1454

## 2016-09-30 NOTE — Discharge Instructions (Signed)
Your ears have been flushed and irrigated in clinic, follow up with her primary care provider as needed if you have any further issues or concerns, otherwise return to clinic as needed.

## 2016-10-26 ENCOUNTER — Other Ambulatory Visit: Payer: Self-pay | Admitting: Pharmacist

## 2016-10-27 ENCOUNTER — Encounter: Payer: Self-pay | Admitting: Pharmacist

## 2016-10-27 NOTE — Patient Outreach (Signed)
[facility]) Care Management  [place]   10/27/2016  Richard [name] [birth year] [id number]  Subjective: Patient's wife was called per referral from [place], Richard [name] for medication assistance for her and Richard [name].  HIPAA identifiers were obtained.  Phone call included the Richardand Mrs. [name] on speaker phone.    Patient is a 71 year old male with multiple medical conditions including but not limited to: CAD, hyperlipidemia, hypertension, and right bundle branch block.  He expressed difficulty affording Breo, ProAir, and Duoneb.  Objective:   Encounter Medications: Outpatient Encounter Prescriptions as of 10/26/2016  Medication Sig Note  . albuterol (PROVENTIL HFA;VENTOLIN HFA) 108 (90 Base) MCG/ACT inhaler Inhale into the lungs every 6 (six) hours as needed for wheezing or shortness of breath.   Marland Kitchen amLODipine (NORVASC) 5 MG tablet Take 5 mg by mouth daily.   Marland Kitchen aspirin 325 MG tablet Take 325 mg by mouth daily.   Marland Kitchen atorvastatin (LIPITOR) 20 MG tablet Take 20 mg by mouth daily.     . carvedilol (COREG) 12.5 MG tablet Take 12.5 mg by mouth 2 (two) times daily.     . fluticasone furoate-vilanterol (BREO ELLIPTA) 200-25 MCG/INH AEPB Inhale 1 puff into the lungs daily.   Marland Kitchen ipratropium-albuterol (DUONEB) 0.5-2.5 (3) MG/3ML SOLN Take 3 mLs by nebulization 3 (three) times daily. When feeling better change instructions to take every 6 hours if needed for wheezing or shortness of breath   . mometasone (NASONEX) 50 MCG/ACT nasal spray Place 2 sprays into the nose 2 (two) times daily.   . montelukast (SINGULAIR) 10 MG tablet Take 10 mg by mouth at bedtime.   Marland Kitchen omeprazole (PRILOSEC) 20 MG capsule Take 20 mg by mouth every evening.    . nitroGLYCERIN (NITROSTAT) 0.4 MG SL tablet Place 1 tablet (0.4 mg total) under the tongue every 5 (five) minutes as needed. For chest pain (Patient not taking: Reported on 10/26/2016)   . [DISCONTINUED]  lisinopril-hydrochlorothiazide (PRINZIDE,ZESTORETIC) 20-12.5 MG per tablet Take 1 tablet by mouth every evening.  10/26/2016: Face swollen/lips swollen  . [DISCONTINUED] predniSONE (DELTASONE) 50 MG tablet Take 1 tablet daily with food (Patient not taking: Reported on 10/26/2016)    No facility-administered encounter medications on file as of 10/26/2016.     Functional Status: No flowsheet data found.  Fall/Depression Screening: No flowsheet data found. No flowsheet data found.    Assessment:  Patient's medications were reviewed over the phone.   Drugs sorted by system:  Cardiovascular: Amlodipine Aspirin 325 Carvedilol Nitroglycerin Atorvastatin  Pulmonary/Allergy: Breo Mometasone Montelukast Ipratropium-Albuterol Albuterol HFA  Gastrointestinal: Omeprazole  Medication Review Findings: -patient is over income for the "Extra Help" program offered by Social Security  -According to [employer], patient has spent $534.39 out-of-pocket in medication expenditures  -Patient may qualify to receive Breo and Ventolin (if substituted for ProAir) from the [place] requires patient's to spend $600 out of pocket to qualify for their program.    -Patient has been getting albuterol/ipratropium nebulizer solution filled on his Part B plan.  Patient will be asked to get a print out from his pharmacy highlighting his drug costs.   Plan: A letter will be routed to Richard [name], [employer] to have an application mailed to the patient's home.  Note will be routed to the patient's PCP with a request to substitute Ventolin for Proventil  Richard [name] will follow up with the patient.  Richard [name], PharmD, BCACP [facility]  Clinical Pharmacist [phone]    Plan:

## 2016-11-02 ENCOUNTER — Other Ambulatory Visit: Payer: Self-pay | Admitting: Pharmacy Technician

## 2016-11-02 NOTE — Patient Outreach (Signed)
[facility]) Care Management  11/02/2016  Leighton Brickley [place] [birth year] [id number]  I contacted the patient today in reference to patient assistance application for [employer] for medication's Breo and Ventolin. Patient states he is out of the state until 11-12-2016. I informed him that the application's should be in the mail when he returns and discussed what I need him to do in order to have the application processed. I also contacted Dr. Oralia Rud office by fax to request hardcopy's of his two medication's in hopes that I will have them by the time the patient mails his portion back.  Doreene Burke, [place] [phone]

## 2016-11-16 ENCOUNTER — Other Ambulatory Visit: Payer: Self-pay | Admitting: Pharmacy Technician

## 2016-11-16 NOTE — Patient Outreach (Signed)
[facility]) Care Management  11/16/2016  Richard [name] [place] [birth year] [id number]   I called today to follow-up on previous conversation that I had with Richard [name] in reference to patient assistance application's that I mailed last month. When I initially spoke with the patient he was out of town for two weeks. Today I spoke with his wife Richard [name] who informed me that they have received the application's and are in the process of completing them. I also requested for the patient to obtain an out of pocket spend from his pharmacy and mail it along with the application and to contact me with any further questions or concerns.  Doreene Burke, [place] [phone]

## 2016-11-23 ENCOUNTER — Other Ambulatory Visit: Payer: Self-pay | Admitting: Pharmacy Technician

## 2016-11-23 NOTE — Patient Outreach (Signed)
[facility]) Care Management  11/23/2016  Richard [name] [place] 03-26-1945 [id number]  I have received patient's completed application for [name] patient assistance for Breo and Ventolin. After looking over the application I noticed that the patient is short of meeting his $600 out of pocket cost by $28.44. I spoke with his spouse and informed her that after his next refill of Ventolin to get another printout from the pharmacy and I will be able to submit his application at that time.  Doreene Burke, [place] [phone]

## 2016-12-01 ENCOUNTER — Other Ambulatory Visit: Payer: Self-pay | Admitting: Pharmacy Technician

## 2016-12-01 NOTE — Patient Outreach (Signed)
[facility]) Care Management  12/01/2016  Richard [name] [place] Jul 21, 1945 [id number]  I contacted [place] patient assistance program to check on the status of an application that was faxed on 11-24-16. The patient was approved on 11-28-16 through 03-08-2017 and his medication's will ship in 7-10 days once his order is processed. I called the patient to inform him that his application was approved and when he should expect his medication's.  Doreene Burke, [place] [phone]

## 2016-12-14 DIAGNOSIS — E669 Obesity, unspecified: Secondary | ICD-10-CM | POA: Diagnosis not present

## 2016-12-14 DIAGNOSIS — Z23 Encounter for immunization: Secondary | ICD-10-CM | POA: Diagnosis not present

## 2016-12-14 DIAGNOSIS — I252 Old myocardial infarction: Secondary | ICD-10-CM | POA: Diagnosis not present

## 2016-12-14 DIAGNOSIS — I119 Hypertensive heart disease without heart failure: Secondary | ICD-10-CM | POA: Diagnosis not present

## 2016-12-14 DIAGNOSIS — Z79899 Other long term (current) drug therapy: Secondary | ICD-10-CM | POA: Diagnosis not present

## 2016-12-14 DIAGNOSIS — Z6832 Body mass index (BMI) 32.0-32.9, adult: Secondary | ICD-10-CM | POA: Diagnosis not present

## 2016-12-14 DIAGNOSIS — J4540 Moderate persistent asthma, uncomplicated: Secondary | ICD-10-CM | POA: Diagnosis not present

## 2016-12-14 DIAGNOSIS — J441 Chronic obstructive pulmonary disease with (acute) exacerbation: Secondary | ICD-10-CM | POA: Diagnosis not present

## 2016-12-14 DIAGNOSIS — K580 Irritable bowel syndrome with diarrhea: Secondary | ICD-10-CM | POA: Diagnosis not present

## 2016-12-14 DIAGNOSIS — E7800 Pure hypercholesterolemia, unspecified: Secondary | ICD-10-CM | POA: Diagnosis not present

## 2016-12-14 DIAGNOSIS — I2510 Atherosclerotic heart disease of native coronary artery without angina pectoris: Secondary | ICD-10-CM | POA: Diagnosis not present

## 2017-01-04 DIAGNOSIS — H2513 Age-related nuclear cataract, bilateral: Secondary | ICD-10-CM | POA: Diagnosis not present

## 2017-01-04 DIAGNOSIS — H5213 Myopia, bilateral: Secondary | ICD-10-CM | POA: Diagnosis not present

## 2017-01-06 ENCOUNTER — Other Ambulatory Visit: Payer: Self-pay | Admitting: Pharmacy Technician

## 2017-01-06 NOTE — Patient Outreach (Signed)
[facility]) Care Management  01/06/2017  Richard [name] 08-Nov-1945 [id number]  I contacted patient today to see if his medication's have arrived that were approved through the [place] patient assistance program. HIPAA identifiers verified and verbal consent received. The patient states he has received both Ventolin HFA and Breo. I will route a message to [place], [place] to close this patient out.  Doreene Burke, [place] [phone]

## 2017-01-07 ENCOUNTER — Encounter: Payer: Self-pay | Admitting: Pharmacist

## 2017-02-04 ENCOUNTER — Other Ambulatory Visit: Payer: Self-pay | Admitting: Pharmacy Technician

## 2017-02-04 NOTE — Patient Outreach (Signed)
[facility]) Care Management  02/04/2017  Richard [name] 10/26/1945 [id number]  Incoming [employer] Emmi call in reference to medication adherence. HIPAA identifiers verified and verbal consent received. The medication in question is Atorvastatin 20 mg and the patient states he takes it daily as prescribed and does not miss any doses. He currently has medication and will call his pharmacy next week to refill.  Doreene Burke, [place] [phone]

## 2017-04-19 DIAGNOSIS — K219 Gastro-esophageal reflux disease without esophagitis: Secondary | ICD-10-CM | POA: Diagnosis not present

## 2017-04-19 DIAGNOSIS — I5030 Unspecified diastolic (congestive) heart failure: Secondary | ICD-10-CM | POA: Diagnosis not present

## 2017-04-19 DIAGNOSIS — I2109 ST elevation (STEMI) myocardial infarction involving other coronary artery of anterior wall: Secondary | ICD-10-CM | POA: Diagnosis not present

## 2017-04-19 DIAGNOSIS — I119 Hypertensive heart disease without heart failure: Secondary | ICD-10-CM | POA: Diagnosis not present

## 2017-04-19 DIAGNOSIS — J441 Chronic obstructive pulmonary disease with (acute) exacerbation: Secondary | ICD-10-CM | POA: Diagnosis not present

## 2017-04-19 DIAGNOSIS — I2510 Atherosclerotic heart disease of native coronary artery without angina pectoris: Secondary | ICD-10-CM | POA: Diagnosis not present

## 2017-04-19 DIAGNOSIS — E7800 Pure hypercholesterolemia, unspecified: Secondary | ICD-10-CM | POA: Diagnosis not present

## 2017-04-19 DIAGNOSIS — Z79899 Other long term (current) drug therapy: Secondary | ICD-10-CM | POA: Diagnosis not present

## 2017-04-19 DIAGNOSIS — K589 Irritable bowel syndrome without diarrhea: Secondary | ICD-10-CM | POA: Diagnosis not present

## 2017-04-19 DIAGNOSIS — J4540 Moderate persistent asthma, uncomplicated: Secondary | ICD-10-CM | POA: Diagnosis not present

## 2017-04-19 DIAGNOSIS — Z125 Encounter for screening for malignant neoplasm of prostate: Secondary | ICD-10-CM | POA: Diagnosis not present

## 2017-04-19 DIAGNOSIS — K210 Gastro-esophageal reflux disease with esophagitis: Secondary | ICD-10-CM | POA: Diagnosis not present

## 2017-04-19 DIAGNOSIS — E669 Obesity, unspecified: Secondary | ICD-10-CM | POA: Diagnosis not present

## 2017-04-22 ENCOUNTER — Encounter (INDEPENDENT_AMBULATORY_CARE_PROVIDER_SITE_OTHER): Payer: Self-pay

## 2017-04-22 ENCOUNTER — Encounter: Payer: Self-pay | Admitting: Cardiology

## 2017-04-22 ENCOUNTER — Ambulatory Visit: Payer: PPO | Admitting: Cardiology

## 2017-04-22 VITALS — BP 124/76 | HR 82 | Ht 68.0 in | Wt 257.0 lb

## 2017-04-22 DIAGNOSIS — I2510 Atherosclerotic heart disease of native coronary artery without angina pectoris: Secondary | ICD-10-CM

## 2017-04-22 DIAGNOSIS — I10 Essential (primary) hypertension: Secondary | ICD-10-CM | POA: Diagnosis not present

## 2017-04-22 DIAGNOSIS — R0602 Shortness of breath: Secondary | ICD-10-CM | POA: Diagnosis not present

## 2017-04-22 DIAGNOSIS — E785 Hyperlipidemia, unspecified: Secondary | ICD-10-CM | POA: Diagnosis not present

## 2017-04-22 MED ORDER — ASPIRIN EC 81 MG PO TBEC: 81.0000 mg | DELAYED_RELEASE_TABLET | Freq: Every day | ORAL | 3 refills | Status: AC

## 2017-04-22 MED ORDER — NITROGLYCERIN 0.4 MG SL SUBL: 0.4000 mg | SUBLINGUAL_TABLET | SUBLINGUAL | 11 refills | Status: DC | PRN

## 2017-04-22 NOTE — Patient Instructions (Addendum)
Medication Instructions:  Your physician has recommended you make the following change in your medication:  1.  STOP the Aspirin 325 mg 2.  START Aspirin 81 mg daily   Labwork: None ordered  Testing/Procedures: Your physician has requested that you have en exercise stress myoview. For further information please visit [email or web address]. Please follow instruction sheet, as given.   Follow-Up: Your physician recommends that you schedule a follow-up appointment in:    Any Other Special Instructions Will Be Listed Below (If Applicable).     If you need a refill on your cardiac medications before your next appointment, please call your pharmacy.   DASH Eating Plan DASH stands for "Dietary Approaches to Stop Hypertension." The DASH eating plan is a healthy eating plan that has been shown to reduce high blood pressure (hypertension). It may also reduce your risk for type 2 diabetes, heart disease, and stroke. The DASH eating plan may also help with weight loss. What are tips for following this plan? General guidelines  Avoid eating more than 2,300 mg (milligrams) of salt (sodium) a day. If you have hypertension, you may need to reduce your sodium intake to 1,500 mg a day.  Limit alcohol intake to no more than 1 drink a day for nonpregnant women and 2 drinks a day for men. One drink equals 12 oz of beer, 5 oz of wine, or 1 oz of hard liquor.  Work with your health care provider to maintain a healthy body weight or to lose weight. Ask what an ideal weight is for you.  Get at least 30 minutes of exercise that causes your heart to beat faster (aerobic exercise) most days of the week. Activities may include walking, swimming, or biking.  Work with your health care provider or diet and nutrition specialist (dietitian) to adjust your eating plan to your individual calorie needs. Reading food labels  Check food labels for the amount of sodium per serving. Choose foods with less than 5  percent of the Daily Value of sodium. Generally, foods with less than 300 mg of sodium per serving fit into this eating plan.  To find whole grains, look for the word "whole" as the first word in the ingredient list. Shopping  Buy products labeled as "low-sodium" or "no salt added."  Buy fresh foods. Avoid canned foods and premade or frozen meals. Cooking  Avoid adding salt when cooking. Use salt-free seasonings or herbs instead of table salt or sea salt. Check with your health care provider or pharmacist before using salt substitutes.  Do not fry foods. Cook foods using healthy methods such as baking, boiling, grilling, and broiling instead.  Cook with heart-healthy oils, such as olive, canola, soybean, or sunflower oil. Meal planning   Eat a balanced diet that includes: ? 5 or more servings of fruits and vegetables each day. At each meal, try to fill half of your plate with fruits and vegetables. ? Up to 6-8 servings of whole grains each day. ? Less than 6 oz of lean meat, poultry, or fish each day. A 3-oz serving of meat is about the same size as a deck of cards. One egg equals 1 oz. ? 2 servings of low-fat dairy each day. ? A serving of nuts, seeds, or beans 5 times each week. ? Heart-healthy fats. Healthy fats called Omega-3 fatty acids are found in foods such as flaxseeds and coldwater fish, like sardines, salmon, and mackerel.  Limit how much you eat of the following: ? Canned or prepackaged  foods. ? Food that is high in trans fat, such as fried foods. ? Food that is high in saturated fat, such as fatty meat. ? Sweets, desserts, sugary drinks, and other foods with added sugar. ? Full-fat dairy products.  Do not salt foods before eating.  Try to eat at least 2 vegetarian meals each week.  Eat more home-cooked food and less restaurant, buffet, and fast food.  When eating at a restaurant, ask that your food be prepared with less salt or no salt, if possible. What foods are  recommended? The items listed may not be a complete list. Talk with your dietitian about what dietary choices are best for you. Grains Whole-grain or whole-wheat bread. Whole-grain or whole-wheat pasta. Brown rice. [place]. Bulgur. Whole-grain and low-sodium cereals. Pita bread. Low-fat, low-sodium crackers. Whole-wheat flour tortillas. Vegetables Fresh or frozen vegetables (raw, steamed, roasted, or grilled). Low-sodium or reduced-sodium tomato and vegetable juice. Low-sodium or reduced-sodium tomato sauce and tomato paste. Low-sodium or reduced-sodium canned vegetables. Fruits All fresh, dried, or frozen fruit. Canned fruit in natural juice (without added sugar). Meat and other protein foods Skinless chicken or [place]. Ground chicken or [place]. Pork with fat trimmed off. Fish and seafood. Egg whites. Dried beans, peas, or lentils. Unsalted nuts, nut butters, and seeds. Unsalted canned beans. Lean cuts of beef with fat trimmed off. Low-sodium, lean deli meat. Dairy Low-fat (1%) or fat-free (skim) milk. Fat-free, low-fat, or reduced-fat cheeses. Nonfat, low-sodium ricotta or cottage cheese. Low-fat or nonfat yogurt. Low-fat, low-sodium cheese. Fats and oils Soft margarine without trans fats. Vegetable oil. Low-fat, reduced-fat, or light mayonnaise and salad dressings (reduced-sodium). Canola, safflower, olive, soybean, and sunflower oils. Avocado. Seasoning and other foods Herbs. Spices. Seasoning mixes without salt. Unsalted popcorn and pretzels. Fat-free sweets. What foods are not recommended? The items listed may not be a complete list. Talk with your dietitian about what dietary choices are best for you. Grains Baked goods made with fat, such as croissants, muffins, or some breads. Dry pasta or rice meal packs. Vegetables Creamed or fried vegetables. Vegetables in a cheese sauce. Regular canned vegetables (not low-sodium or reduced-sodium). Regular canned tomato sauce and paste (not  low-sodium or reduced-sodium). Regular tomato and vegetable juice (not low-sodium or reduced-sodium). Angie Fava. Olives. Fruits Canned fruit in a light or heavy syrup. Fried fruit. Fruit in cream or butter sauce. Meat and other protein foods Fatty cuts of meat. Ribs. Fried meat. Berniece Salines. Sausage. Bologna and other processed lunch meats. Salami. Fatback. Hotdogs. Bratwurst. Salted nuts and seeds. Canned beans with added salt. Canned or smoked fish. Whole eggs or egg yolks. Chicken or [place] with skin. Dairy Whole or 2% milk, cream, and half-and-half. Whole or full-fat cream cheese. Whole-fat or sweetened yogurt. Full-fat cheese. Nondairy creamers. Whipped toppings. Processed cheese and cheese spreads. Fats and oils Butter. Stick margarine. Lard. Shortening. Ghee. Bacon fat. Tropical oils, such as coconut, palm kernel, or palm oil. Seasoning and other foods Salted popcorn and pretzels. Onion salt, garlic salt, seasoned salt, table salt, and sea salt. Worcestershire sauce. Tartar sauce. Barbecue sauce. Teriyaki sauce. Soy sauce, including reduced-sodium. Steak sauce. Canned and packaged gravies. Fish sauce. Oyster sauce. Cocktail sauce. Horseradish that you find on the shelf. Ketchup. Mustard. Meat flavorings and tenderizers. Bouillon cubes. Hot sauce and Tabasco sauce. Premade or packaged marinades. Premade or packaged taco seasonings. Relishes. Regular salad dressings. Where to find more information:  National Heart, Lung, and [place]: https://wilson-eaton.com/  American Heart Association: www.heart.org Summary  The DASH eating  plan is a healthy eating plan that has been shown to reduce high blood pressure (hypertension). It may also reduce your risk for type 2 diabetes, heart disease, and stroke.  With the DASH eating plan, you should limit salt (sodium) intake to 2,300 mg a day. If you have hypertension, you may need to reduce your sodium intake to 1,500 mg a day.  When on the DASH eating plan,  aim to eat more fresh fruits and vegetables, whole grains, lean proteins, low-fat dairy, and heart-healthy fats.  Work with your health care provider or diet and nutrition specialist (dietitian) to adjust your eating plan to your individual calorie needs. This information is not intended to replace advice given to you by your health care provider. Make sure you discuss any questions you have with your health care provider. Document Released: 02/12/2011 Document Revised: 02/17/2016 Document Reviewed: 02/17/2016 Elsevier Interactive Patient Education  [employer].

## 2017-04-22 NOTE — Progress Notes (Signed)
Cardiology Office Note:    Date:  04/22/2017   ID:  [name], DOB 20-Oct-1945, MRN [id number]  PCP:  Vincente Liberty, MD  Cardiologist:  Sinclair Grooms, MD  Referring MD: Vincente Liberty, MD   Chief Complaint  Patient presents with  . Shortness of Breath    on exertion    History of Present Illness:    JANIEL CRISOSTOMO is a 72 y.o. 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   Mr. Sanker was last seen in the office 06/27/2014 by Dr. Tamala Julian at which time there were no anginal complaints and hypertension was under excellent control. He was noted to have new RBBB and stress test was done to rule out ischemic cause. He had an intermediate risk Myoview in 07/2014 that showed old injury and mildly reduced EF. Not felt to be significant per Dr. Tamala Julian as pt was asymptomatic.   Lisinopril was discontinued in 06/2016 when seen at the ED for angioedema.   He is concerned that he has gained about 20 lbs in last 3 years since he retired. Is having increased shortness of breath with exertion like walking up hill, walking up 1-2 flights of stairs and sometimes when walking flat for longer distances since just before Christmas.  Has occ mild chest tightness lasting for a few seconds sometimes at rest and sometimes with exertion.  No orthopnea, PND. Has had mild pedal edema, just recently started on chlorthalidone per PCP.  Used to go to [employer] but has not been going for about a year ago. Works to limit salt.   He says his BP was elevated to 170 at his PCP a few days ago and he was noted to have mild pedal edema, chlorthalidone was added.    Past Medical History:  Diagnosis Date  . Arthritis   . Asthma   . High cholesterol   . Hypertension   . Myocardial infarction [facility]) 2004   Dr. Tamala Julian, at [facility], yearly visits    Past Surgical History:  Procedure Laterality Date  . CARDIAC  CATHETERIZATION  2004   with 2 stents  . CARDIOVASCULAR STRESS TEST  2011  . COLONOSCOPY    . POLYPECTOMY  02/17/2011   Procedure: POLYPECTOMY NASAL;  Surgeon: Delsa Bern;  Location: [place] OR;  Service: ENT;  Laterality: N/A;  . SINUS ENDO W/FUSION  02/17/2011   Procedure: ENDOSCOPIC SINUS SURGERY WITH FUSION NAVIGATION;  Surgeon: Delsa Bern;  Location: [facility] OR;  Service: ENT;  Laterality: N/A;  . TONSILLECTOMY    . US ECHOCARDIOGRAPHY      Current Medications: Current Meds  Medication Sig  . albuterol (PROVENTIL HFA;VENTOLIN HFA) 108 (90 Base) MCG/ACT inhaler Inhale into the lungs every 6 (six) hours as needed for wheezing or shortness of breath.  Marland Kitchen amLODipine (NORVASC) 5 MG tablet Take 5 mg by mouth daily.  Marland Kitchen atorvastatin (LIPITOR) 40 MG tablet Take 40 mg by mouth at bedtime.  . carvedilol (COREG) 12.5 MG tablet Take 12.5 mg by mouth 2 (two) times daily.    . chlorthalidone (HYGROTON) 50 MG tablet Take 50 mg by mouth daily.  . fluticasone furoate-vilanterol (BREO ELLIPTA) 200-25 MCG/INH AEPB Inhale 1 puff into the lungs daily.  Marland Kitchen ipratropium-albuterol (DUONEB) 0.5-2.5 (3) MG/3ML SOLN Take 3 mLs by nebulization 3 (three) times daily. When feeling better change instructions to take every 6 hours if needed for  wheezing or shortness of breath  . montelukast (SINGULAIR) 10 MG tablet Take 10 mg by mouth at bedtime.  . nitroGLYCERIN (NITROSTAT) 0.4 MG SL tablet Place 1 tablet (0.4 mg total) under the tongue every 5 (five) minutes as needed. For chest pain  . omeprazole (PRILOSEC) 20 MG capsule Take 20 mg by mouth every evening.   Marland Kitchen  aspirin 325 MG tablet Take 325 mg by mouth daily.  .  nitroGLYCERIN (NITROSTAT) 0.4 MG SL tablet Place 1 tablet (0.4 mg total) under the tongue every 5 (five) minutes as needed. For chest pain     Allergies:   Beef-derived products; Chicken protein; Lisinopril; and Peanut-containing drug products   Social History   Socioeconomic History  . Marital  status: Married    Spouse name: None  . Number of children: None  . Years of education: None  . Highest education level: None  Social Needs  . Financial resource strain: None  . Food insecurity - worry: None  . Food insecurity - inability: None  . Transportation needs - medical: None  . Transportation needs - non-medical: None  Occupational History  . None  Tobacco Use  . Smoking status: Former Research scientist (life sciences)  . Smokeless tobacco: Never Used  Substance and Sexual Activity  . Alcohol use: Yes    Alcohol/week: 3.2 oz    Types: 2 Cans of beer, 4 Standard drinks or equivalent per week    Comment: 5-6 drinks/week  . Drug use: No  . Sexual activity: None  Other Topics Concern  . None  Social History Narrative  . None     Family History: The patient's family history includes Heart attack in his father. ROS:   Please see the history of present illness.     All other systems reviewed and are negative.  EKGs/Labs/Other Studies Reviewed:    The following studies were reviewed today:  Myoview 07/18/2014 Study Highlights    There is a small defect of moderate severity present in the basal inferolateral location. The defect is non-reversible. This is consistent with a small scar in the left circumflex artery distribution. There is a small defect of mild severity present in the apex location. The defect is partially reversible. This is consistent with a small scar with periinfarct ischemia in the distrbution of the LAD artery.   Intermediate risk study with a small infarction in the LCX distribution and a small area of infarction and limited per-infarct ischemia in the distribution of the distal LAD artery. Mildly reduced LV systolic function.    EKG:  EKG is  ordered today.  The ekg ordered today demonstrates Normal sinus rhythm with RBBB 82 bpm, no change from previous.   Recent Labs: No results found for requested labs within last 8760 hours.   Recent Lipid Panel No results found  for: CHOL, TRIG, HDL, CHOLHDL, VLDL, LDLCALC, LDLDIRECT  Physical Exam:    VS:  BP 124/76   Pulse 82   Ht 5\' 8"  (1.727 m)   Wt 257 lb (116.6 kg)   BMI 39.08 kg/m     Wt Readings from Last 3 Encounters:  04/22/17 257 lb (116.6 kg)  02/23/15 230 lb (104.3 kg)  07/18/14 233 lb (105.7 kg)     Physical Exam  Constitutional: He is oriented to person, place, and time. He appears well-developed and well-nourished. No distress.  HENT:  Head: Normocephalic and atraumatic.  Neck: Normal range of motion. Neck supple. No JVD present.  Cardiovascular: Normal rate, regular rhythm and  normal heart sounds. Exam reveals no gallop and no friction rub.  No murmur heard. Pulmonary/Chest: Effort normal and breath sounds normal. No respiratory distress. He has no wheezes. He has no rales. He exhibits no tenderness.  Abdominal: Soft. Bowel sounds are normal.  Musculoskeletal: He exhibits no edema or deformity.  Neurological: He is alert and oriented to person, place, and time.  Skin: Skin is warm and dry.  Psychiatric: He has a normal mood and affect. His behavior is normal. Thought content normal.    ASSESSMENT:    1. Essential hypertension   2. Shortness of breath   3. CAD in native artery   4. Hyperlipidemia, unspecified hyperlipidemia type    PLAN:    In order of problems listed above:  Dyspnea on exertion: Pt noted DOE and occ brief mild chest tightness not associated with activity. Mild pedal edema. Elevated BP at PCP and chlorthalidone initiated. Currently no edema. No orthopnea or PND. Will check exercise myoview. If normal and continued DOE, plan to check echo. His symptoms may improve with addition of chlorthalidone. Will follow up after ST results.  CAD: S/P MI and stents in 2004. Intermediate risk myoview in 2016- old scar. On BB, statin, Aspirin 325 mg- will reduce to 81 mg. Advised that after evaluation with stress test, if normal, he should begin exercise, resume Silver Sneakers  and/or begin walking and build up to goal of 30 minutes per day. Work on wt loss with improved diet and  Portion control.   Hypertension: amlodipine 5 mg, carvedilol 12.5 mg bid. Lisinopril stopped in 06/2016 for possible angioedema. BP up to 170 at PCP office recently, chlorthalidone added. BP well controlled today. Discused DASH diet and sodium restriction with pt.   Hyperlipidemia: Was on atorvastatin 20 mg daily, recently increased to 40 mg by PCP. Goal LDL<70- discussed with pt.    Medication Adjustments/Labs and Tests Ordered: Current medicines are reviewed at length with the patient today.  Concerns regarding medicines are outlined above. Labs and tests ordered and medication changes are outlined in the patient instructions below:  Patient Instructions  Medication Instructions:  Your physician has recommended you make the following change in your medication:  1.  STOP the Aspirin 325 mg 2.  START Aspirin 81 mg daily   Labwork: None ordered  Testing/Procedures: Your physician has requested that you have en exercise stress myoview. For further information please visit [email or web address]. Please follow instruction sheet, as given.   Follow-Up: Your physician recommends that you schedule a follow-up appointment in:    Any Other Special Instructions Will Be Listed Below (If Applicable).     If you need a refill on your cardiac medications before your next appointment, please call your pharmacy.   DASH Eating Plan DASH stands for "Dietary Approaches to Stop Hypertension." The DASH eating plan is a healthy eating plan that has been shown to reduce high blood pressure (hypertension). It may also reduce your risk for type 2 diabetes, heart disease, and stroke. The DASH eating plan may also help with weight loss. What are tips for following this plan? General guidelines  Avoid eating more than 2,300 mg (milligrams) of salt (sodium) a day. If you have hypertension, you may need to  reduce your sodium intake to 1,500 mg a day.  Limit alcohol intake to no more than 1 drink a day for nonpregnant women and 2 drinks a day for men. One drink equals 12 oz of beer, 5 oz of wine, or 1 oz  of hard liquor.  Work with your health care provider to maintain a healthy body weight or to lose weight. Ask what an ideal weight is for you.  Get at least 30 minutes of exercise that causes your heart to beat faster (aerobic exercise) most days of the week. Activities may include walking, swimming, or biking.  Work with your health care provider or diet and nutrition specialist (dietitian) to adjust your eating plan to your individual calorie needs. Reading food labels  Check food labels for the amount of sodium per serving. Choose foods with less than 5 percent of the Daily Value of sodium. Generally, foods with less than 300 mg of sodium per serving fit into this eating plan.  To find whole grains, look for the word "whole" as the first word in the ingredient list. Shopping  Buy products labeled as "low-sodium" or "no salt added."  Buy fresh foods. Avoid canned foods and premade or frozen meals. Cooking  Avoid adding salt when cooking. Use salt-free seasonings or herbs instead of table salt or sea salt. Check with your health care provider or pharmacist before using salt substitutes.  Do not fry foods. Cook foods using healthy methods such as baking, boiling, grilling, and broiling instead.  Cook with heart-healthy oils, such as olive, canola, soybean, or sunflower oil. Meal planning   Eat a balanced diet that includes: ? 5 or more servings of fruits and vegetables each day. At each meal, try to fill half of your plate with fruits and vegetables. ? Up to 6-8 servings of whole grains each day. ? Less than 6 oz of lean meat, poultry, or fish each day. A 3-oz serving of meat is about the same size as a deck of cards. One egg equals 1 oz. ? 2 servings of low-fat dairy each day. ? A  serving of nuts, seeds, or beans 5 times each week. ? Heart-healthy fats. Healthy fats called Omega-3 fatty acids are found in foods such as flaxseeds and coldwater fish, like sardines, salmon, and mackerel.  Limit how much you eat of the following: ? Canned or prepackaged foods. ? Food that is high in trans fat, such as fried foods. ? Food that is high in saturated fat, such as fatty meat. ? Sweets, desserts, sugary drinks, and other foods with added sugar. ? Full-fat dairy products.  Do not salt foods before eating.  Try to eat at least 2 vegetarian meals each week.  Eat more home-cooked food and less restaurant, buffet, and fast food.  When eating at a restaurant, ask that your food be prepared with less salt or no salt, if possible. What foods are recommended? The items listed may not be a complete list. Talk with your dietitian about what dietary choices are best for you. Grains Whole-grain or whole-wheat bread. Whole-grain or whole-wheat pasta. Brown rice. [place] Morrow. Bulgur. Whole-grain and low-sodium cereals. Pita bread. Low-fat, low-sodium crackers. Whole-wheat flour tortillas. Vegetables Fresh or frozen vegetables (raw, steamed, roasted, or grilled). Low-sodium or reduced-sodium tomato and vegetable juice. Low-sodium or reduced-sodium tomato sauce and tomato paste. Low-sodium or reduced-sodium canned vegetables. Fruits All fresh, dried, or frozen fruit. Canned fruit in natural juice (without added sugar). Meat and other protein foods Skinless chicken or [place]. Ground chicken or [place]. Pork with fat trimmed off. Fish and seafood. Egg whites. Dried beans, peas, or lentils. Unsalted nuts, nut butters, and seeds. Unsalted canned beans. Lean cuts of beef with fat trimmed off. Low-sodium, lean deli meat. Dairy Low-fat (1%)  or fat-free (skim) milk. Fat-free, low-fat, or reduced-fat cheeses. Nonfat, low-sodium ricotta or cottage cheese. Low-fat or nonfat yogurt. Low-fat,  low-sodium cheese. Fats and oils Soft margarine without trans fats. Vegetable oil. Low-fat, reduced-fat, or light mayonnaise and salad dressings (reduced-sodium). Canola, safflower, olive, soybean, and sunflower oils. Avocado. Seasoning and other foods Herbs. Spices. Seasoning mixes without salt. Unsalted popcorn and pretzels. Fat-free sweets. What foods are not recommended? The items listed may not be a complete list. Talk with your dietitian about what dietary choices are best for you. Grains Baked goods made with fat, such as croissants, muffins, or some breads. Dry pasta or rice meal packs. Vegetables Creamed or fried vegetables. Vegetables in a cheese sauce. Regular canned vegetables (not low-sodium or reduced-sodium). Regular canned tomato sauce and paste (not low-sodium or reduced-sodium). Regular tomato and vegetable juice (not low-sodium or reduced-sodium). Angie Fava. Olives. Fruits Canned fruit in a light or heavy syrup. Fried fruit. Fruit in cream or butter sauce. Meat and other protein foods Fatty cuts of meat. Ribs. Fried meat. Berniece Salines. Sausage. Bologna and other processed lunch meats. Salami. Fatback. Hotdogs. Bratwurst. Salted nuts and seeds. Canned beans with added salt. Canned or smoked fish. Whole eggs or egg yolks. Chicken or [place] with skin. Dairy Whole or 2% milk, cream, and half-and-half. Whole or full-fat cream cheese. Whole-fat or sweetened yogurt. Full-fat cheese. Nondairy creamers. Whipped toppings. Processed cheese and cheese spreads. Fats and oils Butter. Stick margarine. Lard. Shortening. Ghee. Bacon fat. Tropical oils, such as coconut, palm kernel, or palm oil. Seasoning and other foods Salted popcorn and pretzels. Onion salt, garlic salt, seasoned salt, table salt, and sea salt. Worcestershire sauce. Tartar sauce. Barbecue sauce. Teriyaki sauce. Soy sauce, including reduced-sodium. Steak sauce. Canned and packaged gravies. Fish sauce. Oyster sauce. Cocktail sauce.  Horseradish that you find on the shelf. Ketchup. Mustard. Meat flavorings and tenderizers. Bouillon cubes. Hot sauce and Tabasco sauce. Premade or packaged marinades. Premade or packaged taco seasonings. Relishes. Regular salad dressings. Where to find more information:  National Heart, Lung, and [place]: https://wilson-eaton.com/  American Heart Association: www.heart.org Summary  The DASH eating plan is a healthy eating plan that has been shown to reduce high blood pressure (hypertension). It may also reduce your risk for type 2 diabetes, heart disease, and stroke.  With the DASH eating plan, you should limit salt (sodium) intake to 2,300 mg a day. If you have hypertension, you may need to reduce your sodium intake to 1,500 mg a day.  When on the DASH eating plan, aim to eat more fresh fruits and vegetables, whole grains, lean proteins, low-fat dairy, and heart-healthy fats.  Work with your health care provider or diet and nutrition specialist (dietitian) to adjust your eating plan to your individual calorie needs. This information is not intended to replace advice given to you by your health care provider. Make sure you discuss any questions you have with your health care provider. Document Released: 02/12/2011 Document Revised: 02/17/2016 Document Reviewed: 02/17/2016 Elsevier Interactive Patient Education  2018 [place], Daune Perch, NP  04/22/2017 3:59 PM    [facility]

## 2017-04-26 ENCOUNTER — Telehealth (HOSPITAL_COMMUNITY): Payer: Self-pay | Admitting: *Deleted

## 2017-04-26 NOTE — Telephone Encounter (Signed)
Patient given detailed instructions per Myocardial Perfusion Study Information Sheet for the test on 04/27/17 at 0730. Patient notified to arrive 15 minutes early and that it is imperative to arrive on time for appointment to keep from having the test rescheduled.  If you need to cancel or reschedule your appointment, please call the office within 24 hours of your appointment. . Patient verbalized understanding.Heywood Tokunaga, Ranae Palms

## 2017-04-27 ENCOUNTER — Ambulatory Visit (HOSPITAL_COMMUNITY): Payer: PPO | Attending: Cardiology

## 2017-04-27 DIAGNOSIS — I4510 Unspecified right bundle-branch block: Secondary | ICD-10-CM | POA: Diagnosis not present

## 2017-04-27 DIAGNOSIS — I2510 Atherosclerotic heart disease of native coronary artery without angina pectoris: Secondary | ICD-10-CM | POA: Diagnosis not present

## 2017-04-27 DIAGNOSIS — I10 Essential (primary) hypertension: Secondary | ICD-10-CM | POA: Insufficient documentation

## 2017-04-27 DIAGNOSIS — R0602 Shortness of breath: Secondary | ICD-10-CM | POA: Diagnosis not present

## 2017-04-27 DIAGNOSIS — R0609 Other forms of dyspnea: Secondary | ICD-10-CM | POA: Diagnosis not present

## 2017-04-27 DIAGNOSIS — R0789 Other chest pain: Secondary | ICD-10-CM | POA: Diagnosis not present

## 2017-04-27 LAB — MYOCARDIAL PERFUSION IMAGING
Estimated workload: 7 METS
Exercise duration (min): 5 min
Exercise duration (sec): 0 s
MPHR: 149 {beats}/min
Peak HR: 131 {beats}/min
Percent HR: 88 %
RATE: 0.26
Rest HR: 90 {beats}/min
SDS: 1
SRS: 1
SSS: 2
TID: 1.29

## 2017-04-27 MED ORDER — TECHNETIUM TC 99M TETROFOSMIN IV KIT
10.5000 | PACK | Freq: Once | INTRAVENOUS | Status: AC | PRN
  Administered 2017-04-27: 10.5 via INTRAVENOUS
  Filled 2017-04-27: qty 11

## 2017-04-27 MED ORDER — TECHNETIUM TC 99M TETROFOSMIN IV KIT
31.4000 | PACK | Freq: Once | INTRAVENOUS | Status: AC | PRN
  Administered 2017-04-27: 31.4 via INTRAVENOUS
  Filled 2017-04-27: qty 32

## 2017-05-05 NOTE — Progress Notes (Signed)
Cardiology Office Note   Date:  05/06/2017   ID:  Richard [name], DOB 1945-07-23, MRN [id number]  PCP:  Vincente Liberty, MD  Cardiologist:  Dr. Tamala Julian    Chief Complaint  Patient presents with  . Coronary Artery Disease      History of Present Illness: Richard [name] is a 72 y.o. male who presents for DOE.    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   Richard [name] was last seen in the office 06/27/2014 by Dr. Tamala Julian he was noted to have new RBBB and stress test was done to rule out ischemic cause. He had an intermediate risk Myoview in 07/2014 that showed old injury and mildly reduced EF. Not felt to be significant per Dr. Tamala Julian as pt was asymptomatic.   Lisinopril was discontinued in 06/2016 when seen at the ED for angioedema.   He is concerned that he has gained about 20 lbs in last 3 years since he retired. Is having increased shortness of breath with exertion like walking up hill, walking up 1-2 flights of stairs and sometimes when walking flat for longer distances since just before Christmas.  Has occ mild chest tightness lasting for a few seconds sometimes at rest and sometimes with exertion.  No orthopnea, PND. Has had mild pedal edema, just recently started on chlorthalidone per PCP.  Used to go to [employer] but has not been going for about a year ago. Works to limit salt.   On last visit he had DOE -exercise myoview and possible echo--HTN   nuc was normal  Today he tells me he may see slight improvement of the SOB but he cannot exercise for long due to DOE.  No further lower ext edema.  No chest pain.  His PCP is increasing his Prilosec, may be reflux with rest at night.  He has gained wt as above.  But even with continued exercise the DOE continues.      Past Medical History:  Diagnosis Date  . Arthritis   . Asthma   . High cholesterol   . Hypertension   .  Myocardial infarction [facility]) 2004   Dr. Tamala Julian, at [place], yearly visits    Past Surgical History:  Procedure Laterality Date  . CARDIAC CATHETERIZATION  2004   with 2 stents  . CARDIOVASCULAR STRESS TEST  2011  . COLONOSCOPY    . POLYPECTOMY  02/17/2011   Procedure: POLYPECTOMY NASAL;  Surgeon: Delsa Bern;  Location: [facility] OR;  Service: ENT;  Laterality: N/A;  . SINUS ENDO W/FUSION  02/17/2011   Procedure: ENDOSCOPIC SINUS SURGERY WITH FUSION NAVIGATION;  Surgeon: Delsa Bern;  Location: [facility] OR;  Service: ENT;  Laterality: N/A;  . TONSILLECTOMY    . US ECHOCARDIOGRAPHY       Current Outpatient Medications  Medication Sig Dispense Refill  . albuterol (PROVENTIL HFA;VENTOLIN HFA) 108 (90 Base) MCG/ACT inhaler Inhale into the lungs every 6 (six) hours as needed for wheezing or shortness of breath.    Marland Kitchen amLODipine (NORVASC) 5 MG tablet Take 5 mg by mouth daily.  3  . aspirin EC 81 MG tablet Take 1 tablet (81 mg total) by mouth daily. 90 tablet 3  . atorvastatin (LIPITOR) 40 MG tablet Take 40 mg by mouth at bedtime.  4  . carvedilol (COREG) 12.5 MG tablet Take 12.5 mg by mouth 2 (two) times daily.      Marland Kitchen  chlorthalidone (HYGROTON) 50 MG tablet Take 50 mg by mouth daily.  3  . fluticasone furoate-vilanterol (BREO ELLIPTA) 200-25 MCG/INH AEPB Inhale 1 puff into the lungs daily.    Marland Kitchen ipratropium-albuterol (DUONEB) 0.5-2.5 (3) MG/3ML SOLN Take 3 mLs by nebulization 3 (three) times daily. When feeling better change instructions to take every 6 hours if needed for wheezing or shortness of breath 360 mL 6  . montelukast (SINGULAIR) 10 MG tablet Take 10 mg by mouth at bedtime.    . nitroGLYCERIN (NITROSTAT) 0.4 MG SL tablet Place 1 tablet (0.4 mg total) under the tongue every 5 (five) minutes as needed. For chest pain 25 tablet 11  . omeprazole (PRILOSEC) 20 MG capsule Take 20 mg by mouth every evening.      No current facility-administered medications for this visit.     Allergies:    Beef-derived products; Chicken protein; Lisinopril; and Peanut-containing drug products    Social History:  The patient  reports that he has quit smoking. he has never used smokeless tobacco. He reports that he drinks about 3.2 oz of alcohol per week. He reports that he does not use drugs.   Family History:  The patient's family history includes Alzheimer's disease in his mother; Cancer in his mother; Diabetes in his father and sister; Heart attack in his father; Hypertension in his sister.    ROS:  General:no colds or fevers, 3 lb weight loss Skin:no rashes or ulcers HEENT:no blurred vision, no congestion CV:see HPI PUL:see HPI GI:no diarrhea constipation or melena, no indigestion GU:no hematuria, no dysuria MS:no joint pain, no claudication Neuro:no syncope, no lightheadedness Endo:no diabetes, no thyroid disease  Wt Readings from Last 3 Encounters:  05/06/17 254 lb 6.4 oz (115.4 kg)  04/27/17 257 lb (116.6 kg)  04/22/17 257 lb (116.6 kg)     PHYSICAL EXAM: VS:  BP 126/80   Pulse 89   Ht 5\' 8"  (1.727 m)   Wt 254 lb 6.4 oz (115.4 kg)   BMI 38.68 kg/m  , BMI Body mass index is 38.68 kg/m. General:Pleasant affect, NAD Skin:Warm and dry, brisk capillary refill HEENT:normocephalic, sclera clear, mucus membranes moist Neck:supple, no JVD, no bruits  Heart:S1S2 RRR without murmur, gallup, rub or click Lungs:clear without rales, rhonchi, or wheezes ZCH:YIFO, non tender, + BS, do not palpate liver spleen or masses Ext:no lower ext edema, 2+ pedal pulses, 2+ radial pulses Neuro:alert and oriented X 3, MAE, follows commands, + facial symmetry    EKG:  EKG is NOT ordered today.   Recent Labs: No results found for requested labs within last 8760 hours.    Lipid Panel No results found for: CHOL, TRIG, HDL, CHOLHDL, VLDL, LDLCALC, LDLDIRECT     Other studies Reviewed: Additional studies/ records that were reviewed today include:  NUC study 04/27/17  Study Highlights      There was no ST segment deviation noted during stress.  The study is normal.  This is a low risk study.   Normal pharmacologic nuclear study with no evidence for prior infarct or ischemia.     ASSESSMENT AND PLAN:  1.  DOE which continues with neg nuc study  Will proceed with Echo just to exclude heart. Agree with increase of Prilosec.    We will call results of echo if abnormal will review with Dr. Tamala Julian and may need to see pt earlier,    2.   CAD with hx MI and stent. Neg nuc study. On BB statin and asa.  3.   Obesity difficult to exercise with DOE.  He is aware of need to lose wt.  4.    Hx asthma, followed by PCP and no wheezes.   Uses nebulizer, is on coreg but has been on this for some time.   5.    Essential HTN controlled now-- was elevated previously.    6.    HLD on atorvastatin 40 mg followed by PCP  Current medicines are reviewed with the patient today.  The patient Has no concerns regarding medicines.  The following changes have been made:  See above Labs/ tests ordered today include:see above  Disposition:   FU:  see above  Signed, Cecilie Kicks, NP  05/06/2017 12:16 PM    [place], [place], [place], [place] Phone: [phone]; Fax: [phone]

## 2017-05-06 ENCOUNTER — Ambulatory Visit: Payer: PPO | Admitting: Cardiology

## 2017-05-06 ENCOUNTER — Encounter: Payer: Self-pay | Admitting: Cardiology

## 2017-05-06 VITALS — BP 126/80 | HR 89 | Ht 68.0 in | Wt 254.4 lb

## 2017-05-06 DIAGNOSIS — R0602 Shortness of breath: Secondary | ICD-10-CM | POA: Diagnosis not present

## 2017-05-06 DIAGNOSIS — E785 Hyperlipidemia, unspecified: Secondary | ICD-10-CM | POA: Diagnosis not present

## 2017-05-06 DIAGNOSIS — I2510 Atherosclerotic heart disease of native coronary artery without angina pectoris: Secondary | ICD-10-CM | POA: Diagnosis not present

## 2017-05-06 DIAGNOSIS — I10 Essential (primary) hypertension: Secondary | ICD-10-CM

## 2017-05-06 NOTE — Patient Instructions (Signed)
Medication Instructions:  Your physician recommends that you continue on your current medications as directed. Please refer to the Current Medication list given to you today.  Labwork: None  Testing/Procedures: Your physician has requested that you have an echocardiogram. Echocardiography is a painless test that uses sound waves to create images of your heart. It provides your doctor with information about the size and shape of your heart and how well your heart's chambers and valves are working. This procedure takes approximately one hour. There are no restrictions for this procedure.   Follow-Up: Your physician wants you to follow-up in: 5-6 months with Dr. Tamala Julian. You will receive a reminder letter in the mail two months in advance. If you don't receive a letter, please call our office to schedule the follow-up appointment.   Any Other Special Instructions Will Be Listed Below (If Applicable).     If you need a refill on your cardiac medications before your next appointment, please call your pharmacy.

## 2017-05-17 ENCOUNTER — Ambulatory Visit (HOSPITAL_COMMUNITY): Payer: PPO | Attending: Cardiology

## 2017-05-17 ENCOUNTER — Other Ambulatory Visit: Payer: Self-pay

## 2017-05-17 DIAGNOSIS — I10 Essential (primary) hypertension: Secondary | ICD-10-CM | POA: Insufficient documentation

## 2017-05-17 DIAGNOSIS — I252 Old myocardial infarction: Secondary | ICD-10-CM | POA: Diagnosis not present

## 2017-05-17 DIAGNOSIS — E785 Hyperlipidemia, unspecified: Secondary | ICD-10-CM | POA: Insufficient documentation

## 2017-05-17 DIAGNOSIS — R0602 Shortness of breath: Secondary | ICD-10-CM | POA: Diagnosis not present

## 2017-05-17 DIAGNOSIS — I2510 Atherosclerotic heart disease of native coronary artery without angina pectoris: Secondary | ICD-10-CM | POA: Insufficient documentation

## 2017-05-18 ENCOUNTER — Other Ambulatory Visit: Payer: PPO

## 2017-05-18 ENCOUNTER — Telehealth: Payer: Self-pay | Admitting: *Deleted

## 2017-05-18 DIAGNOSIS — R0602 Shortness of breath: Secondary | ICD-10-CM | POA: Diagnosis not present

## 2017-05-18 NOTE — Addendum Note (Signed)
Addended by: Gaetano Net on: 05/18/2017 10:30 AM   Modules accepted: Orders

## 2017-05-18 NOTE — Telephone Encounter (Signed)
-----   Message from Isaiah Serge, NP sent at 05/17/2017  1:33 PM EDT ----- Dr. Tamala Julian pt with DOE, nuc was normal and this looks good, some LVH- moderate  Any other recommendations?

## 2017-05-18 NOTE — Telephone Encounter (Signed)
ERROR

## 2017-05-19 LAB — CBC
Hematocrit: 43.2 % (ref 37.5–51.0)
Hemoglobin: 14.4 g/dL (ref 13.0–17.7)
MCH: 28.7 pg (ref 26.6–33.0)
MCHC: 33.3 g/dL (ref 31.5–35.7)
MCV: 86 fL (ref 79–97)
Platelets: 248 10*3/uL (ref 150–379)
RBC: 5.02 x10E6/uL (ref 4.14–5.80)
RDW: 15.3 % (ref 12.3–15.4)
WBC: 5.3 10*3/uL (ref 3.4–10.8)

## 2017-05-19 LAB — COMPREHENSIVE METABOLIC PANEL
ALT: 14 IU/L (ref 0–44)
AST: 23 IU/L (ref 0–40)
Albumin/Globulin Ratio: 1.8 (ref 1.2–2.2)
Albumin: 4.3 g/dL (ref 3.5–4.8)
Alkaline Phosphatase: 115 IU/L (ref 39–117)
BUN/Creatinine Ratio: 16 (ref 10–24)
BUN: 18 mg/dL (ref 8–27)
Bilirubin Total: 0.5 mg/dL (ref 0.0–1.2)
CO2: 23 mmol/L (ref 20–29)
Calcium: 9.4 mg/dL (ref 8.6–10.2)
Chloride: 95 mmol/L — ABNORMAL LOW (ref 96–106)
Creatinine, Ser: 1.1 mg/dL (ref 0.76–1.27)
GFR calc Af Amer: 78 mL/min/{1.73_m2} (ref >59)
GFR calc non Af Amer: 67 mL/min/{1.73_m2} (ref >59)
Globulin, Total: 2.4 g/dL (ref 1.5–4.5)
Glucose: 107 mg/dL — ABNORMAL HIGH (ref 65–99)
Potassium: 3.8 mmol/L (ref 3.5–5.2)
Sodium: 137 mmol/L (ref 134–144)
Total Protein: 6.7 g/dL (ref 6.0–8.5)

## 2017-05-19 LAB — PRO B NATRIURETIC PEPTIDE: NT-Pro BNP: 35 pg/mL (ref 0–376)

## 2017-05-24 ENCOUNTER — Telehealth: Payer: Self-pay | Admitting: Cardiology

## 2017-05-24 NOTE — Progress Notes (Signed)
Pt has been made aware of normal result and verbalized understanding.  jw 05/24/17

## 2017-05-24 NOTE — Telephone Encounter (Signed)
New message ° ° ° °Patient calling for lab results °

## 2017-05-24 NOTE — Telephone Encounter (Signed)
Returned pts call and discussed his lab results. See result note. 

## 2017-05-24 NOTE — Telephone Encounter (Signed)
-----   Message from Isaiah Serge, NP sent at 05/19/2017  7:53 AM EDT ----- Labs much improved.  Kidney function better.  Keep appt.

## 2017-06-03 ENCOUNTER — Ambulatory Visit: Payer: PPO | Admitting: Cardiology

## 2017-06-03 ENCOUNTER — Encounter: Payer: Self-pay | Admitting: Cardiology

## 2017-06-03 VITALS — BP 132/82 | HR 85 | Ht 68.0 in | Wt 255.8 lb

## 2017-06-03 DIAGNOSIS — I10 Essential (primary) hypertension: Secondary | ICD-10-CM

## 2017-06-03 DIAGNOSIS — E785 Hyperlipidemia, unspecified: Secondary | ICD-10-CM | POA: Diagnosis not present

## 2017-06-03 DIAGNOSIS — R0602 Shortness of breath: Secondary | ICD-10-CM

## 2017-06-03 DIAGNOSIS — I2510 Atherosclerotic heart disease of native coronary artery without angina pectoris: Secondary | ICD-10-CM | POA: Diagnosis not present

## 2017-06-03 NOTE — Patient Instructions (Addendum)
Medication Instructions:  1. Your physician recommends that you continue on your current medications as directed. Please refer to the Current Medication list given to you today.  Labwork: NONE ORDERED TODAY  Testing/Procedures: NONE ORDERED TODAY  Follow-Up: DR. Tamala Julian ON September 13, 2017 @ 2:40  Any Other Special Instructions Will Be Listed Below (If Applicable).     If you need a refill on your cardiac medications before your next appointment, please call your pharmacy.

## 2017-06-03 NOTE — Progress Notes (Signed)
Cardiology Office Note   Date:  06/03/2017   ID:  Richard [name], DOB 07/27/1945, MRN [id number]  PCP:  Vincente Liberty, MD  Cardiologist:  Dr. Tamala Julian    Chief Complaint  Patient presents with  . Shortness of Breath      History of Present Illness: Richard [name] is a 72 y.o. male who presents for follow up for echo and DOE.  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   06/27/2014 saw Dr. Tamala Julian he was noted to have new RBBB and stress test was done to rule out ischemic cause. He had an intermediate risk Myoview in 07/2014 that showed old injury and mildly reduced EF. Not felt to be significant per Dr. Tamala Julian as pt was asymptomatic.   Lisinopril was discontinued in 06/2016 when seen at the ED for angioedema.  Pt was seen 04/22/17 for concern that he has gained about 20 lbs in last 3 years since he retired. Is having increased shortness of breath with exertion like walking up hill, walking up 1-2 flights of stairs and sometimes when walking flat for longer distances since just before Christmas. Has occ mild chest tightness lasting for a few seconds sometimes at rest and sometimes with exertion.  No orthopnea, PND. Has had mild pedal edema, just recently started on chlorthalidone per PCP.  Used to go to [employer] but has not been going for about a year ago. Works to limit salt.   Exercise myoview and possible echo--HTN   nuc was normal  05/06/17  he noted some slight improvement of the SOB but he cannot exercise for long due to DOE.  No further lower ext edema.  No chest pain.  His PCP is increasing his Prilosec, may be reflux with rest at night.  He has gained wt as above.  But even with continued exercise the DOE continues.    Proceeded with echo with EF EF 55-60% , G1 DD, trivial MR--PA pressure was normal.   PRO BNP 35, K+ 3.8, Cr 1.10 , CBC is normal.    He has been to the  beach and played golf and did do a lot of walking and did fairly well.  No chest pain, continues with some SOB.  Overall he continues to improve.  BP is stable.  He does not have to prop up to sleep at night.  Though he always sleeps on 2 pillows.  Wt is stable.     Past Medical History:  Diagnosis Date  . Arthritis   . Asthma   . High cholesterol   . Hypertension   . Myocardial infarction [facility]) 2004   Dr. Tamala Julian, at [place], yearly visits    Past Surgical History:  Procedure Laterality Date  . CARDIAC CATHETERIZATION  2004   with 2 stents  . CARDIOVASCULAR STRESS TEST  2011  . COLONOSCOPY    . POLYPECTOMY  02/17/2011   Procedure: POLYPECTOMY NASAL;  Surgeon: Delsa Bern;  Location: [facility] OR;  Service: ENT;  Laterality: N/A;  . SINUS ENDO W/FUSION  02/17/2011   Procedure: ENDOSCOPIC SINUS SURGERY WITH FUSION NAVIGATION;  Surgeon: Delsa Bern;  Location: [facility] OR;  Service: ENT;  Laterality: N/A;  . TONSILLECTOMY    . US ECHOCARDIOGRAPHY       Current Outpatient Medications  Medication Sig Dispense Refill  . albuterol (PROVENTIL HFA;VENTOLIN HFA) 108 (90 Base) MCG/ACT inhaler Inhale into  the lungs every 6 (six) hours as needed for wheezing or shortness of breath.    Marland Kitchen amLODipine (NORVASC) 5 MG tablet Take 5 mg by mouth daily.  3  . aspirin EC 81 MG tablet Take 1 tablet (81 mg total) by mouth daily. 90 tablet 3  . atorvastatin (LIPITOR) 40 MG tablet Take 40 mg by mouth at bedtime.  4  . carvedilol (COREG) 12.5 MG tablet Take 12.5 mg by mouth 2 (two) times daily.      . chlorthalidone (HYGROTON) 50 MG tablet Take 50 mg by mouth daily.  3  . fluticasone furoate-vilanterol (BREO ELLIPTA) 200-25 MCG/INH AEPB Inhale 1 puff into the lungs daily.    Marland Kitchen ipratropium-albuterol (DUONEB) 0.5-2.5 (3) MG/3ML SOLN Take 3 mLs by nebulization 3 (three) times daily. When feeling better change instructions to take every 6 hours if needed for wheezing or shortness of breath 360 mL 6  . montelukast  (SINGULAIR) 10 MG tablet Take 10 mg by mouth at bedtime.    . nitroGLYCERIN (NITROSTAT) 0.4 MG SL tablet Place 1 tablet (0.4 mg total) under the tongue every 5 (five) minutes as needed. For chest pain 25 tablet 11  . omeprazole (PRILOSEC) 20 MG capsule Take 20 mg by mouth every evening.      No current facility-administered medications for this visit.     Allergies:   Beef-derived products; Chicken protein; Lisinopril; and Peanut-containing drug products    Social History:  The patient  reports that he has quit smoking. He has never used smokeless tobacco. He reports that he drinks about 3.2 oz of alcohol per week. He reports that he does not use drugs.   Family History:  The patient's family history includes Alzheimer's disease in his mother; Cancer in his mother; Diabetes in his father and sister; Heart attack in his father; Hypertension in his sister.    ROS:  General:no colds or fevers, no weight changes Skin:no rashes or ulcers HEENT:no blurred vision, no congestion CV:see HPI PUL:see HPI GI:no diarrhea constipation or melena, no indigestion GU:no hematuria, no dysuria MS:no joint pain, no claudication Neuro:no syncope, no lightheadedness Endo:no diabetes, no thyroid disease  Wt Readings from Last 3 Encounters:  06/03/17 255 lb 12.8 oz (116 kg)  05/06/17 254 lb 6.4 oz (115.4 kg)  04/27/17 257 lb (116.6 kg)     PHYSICAL EXAM: VS:  BP 132/82   Pulse 85   Ht 5\' 8"  (1.727 m)   Wt 255 lb 12.8 oz (116 kg)   SpO2 95%   BMI 38.89 kg/m  , BMI Body mass index is 38.89 kg/m. General:Pleasant affect, NAD Skin:Warm and dry, brisk capillary refill HEENT:normocephalic, sclera clear, mucus membranes moist Neck:supple, no JVD, no bruits  Heart:S1S2 RRR without murmur, gallup, rub or click Lungs:clear without rales, rhonchi, or wheezes TDV:VOHY, non tender, + BS, do not palpate liver spleen or masses Ext:no lower ext edema, 2+ pedal pulses, 2+ radial pulses Neuro:alert and  oriented X 3, MAE, follows commands, + facial symmetry    EKG:  EKG is NOT ordered today.   Recent Labs: 05/18/2017: ALT 14; BUN 18; Creatinine, Ser 1.10; Hemoglobin 14.4; NT-Pro BNP 35; Platelets 248; Potassium 3.8; Sodium 137    Lipid Panel No results found for: CHOL, TRIG, HDL, CHOLHDL, VLDL, LDLCALC, LDLDIRECT     Other studies Reviewed: Additional studies/ records that were reviewed today include: . nuc study 04/27/17 Study Highlights     There was no ST segment deviation noted during stress.  The study is normal.  This is a low risk study.   Normal pharmacologic nuclear study with no evidence for prior infarct or ischemia.    Echo 2019 Study Conclusions  - Left ventricle: The cavity size was normal. There was moderate   concentric hypertrophy. Systolic function was normal. The   estimated ejection fraction was in the range of 55% to 60%. Wall   motion was normal; there were no regional wall motion   abnormalities. Doppler parameters are consistent with abnormal   left ventricular relaxation (grade 1 diastolic dysfunction).   Doppler parameters are consistent with indeterminate ventricular   filling pressure. - Aortic valve: Transvalvular velocity was within the normal range.   There was no stenosis. There was no regurgitation. - Mitral valve: Transvalvular velocity was within the normal range.   There was no evidence for stenosis. There was trivial   regurgitation. - Right ventricle: The cavity size was normal. Wall thickness was   normal. - Atrial septum: No defect or patent foramen ovale was identified   by color flow Doppler. - Tricuspid valve: There was trivial regurgitation. - Pulmonary arteries: Systolic pressure was within the normal   range. - Global longitudinal strain -19.0% (normal).    ASSESSMENT AND PLAN:  1.  DOE --improving neg nuc study, and Echo with G1DD.  He is feeling better though some DOE with exertion.  Will have him follow  up with Dr. Tamala Julian in July though he know to call is symptoms increase. No change in medications.  2.  CAD with hx MI and stent, on BB statin and ASA  3.   HTN controlled  4.   HLD followed by PCP   5.   Hx asthma, followed by PCP, is on coreg has been on this for some times.  May need to change to bystolic if symptoms continue     Current medicines are reviewed with the patient today.  The patient Has no concerns regarding medicines.  The following changes have been made:  See above Labs/ tests ordered today include:see above  Disposition:   FU:  see above  Signed, Cecilie Kicks, NP  06/03/2017 11:47 AM    [facility] [place], [place], [place], [place] Phone: [phone]; Fax: [phone]

## 2017-07-20 DIAGNOSIS — J441 Chronic obstructive pulmonary disease with (acute) exacerbation: Secondary | ICD-10-CM | POA: Diagnosis not present

## 2017-07-20 DIAGNOSIS — I252 Old myocardial infarction: Secondary | ICD-10-CM | POA: Diagnosis not present

## 2017-07-20 DIAGNOSIS — K210 Gastro-esophageal reflux disease with esophagitis: Secondary | ICD-10-CM | POA: Diagnosis not present

## 2017-07-20 DIAGNOSIS — E669 Obesity, unspecified: Secondary | ICD-10-CM | POA: Diagnosis not present

## 2017-07-20 DIAGNOSIS — I5030 Unspecified diastolic (congestive) heart failure: Secondary | ICD-10-CM | POA: Diagnosis not present

## 2017-07-20 DIAGNOSIS — Z79899 Other long term (current) drug therapy: Secondary | ICD-10-CM | POA: Diagnosis not present

## 2017-07-20 DIAGNOSIS — I119 Hypertensive heart disease without heart failure: Secondary | ICD-10-CM | POA: Diagnosis not present

## 2017-07-20 DIAGNOSIS — J4540 Moderate persistent asthma, uncomplicated: Secondary | ICD-10-CM | POA: Diagnosis not present

## 2017-07-20 DIAGNOSIS — I2510 Atherosclerotic heart disease of native coronary artery without angina pectoris: Secondary | ICD-10-CM | POA: Diagnosis not present

## 2017-07-20 DIAGNOSIS — E7800 Pure hypercholesterolemia, unspecified: Secondary | ICD-10-CM | POA: Diagnosis not present

## 2017-07-20 DIAGNOSIS — K589 Irritable bowel syndrome without diarrhea: Secondary | ICD-10-CM | POA: Diagnosis not present

## 2017-09-03 ENCOUNTER — Encounter: Payer: Self-pay | Admitting: Interventional Cardiology

## 2017-09-13 ENCOUNTER — Ambulatory Visit: Payer: PPO | Admitting: Interventional Cardiology

## 2017-09-13 ENCOUNTER — Encounter: Payer: Self-pay | Admitting: Interventional Cardiology

## 2017-09-13 VITALS — BP 124/82 | HR 93 | Ht 68.0 in | Wt 253.1 lb

## 2017-09-13 DIAGNOSIS — E785 Hyperlipidemia, unspecified: Secondary | ICD-10-CM

## 2017-09-13 DIAGNOSIS — I2510 Atherosclerotic heart disease of native coronary artery without angina pectoris: Secondary | ICD-10-CM

## 2017-09-13 DIAGNOSIS — R0683 Snoring: Secondary | ICD-10-CM

## 2017-09-13 DIAGNOSIS — R0609 Other forms of dyspnea: Secondary | ICD-10-CM

## 2017-09-13 DIAGNOSIS — I10 Essential (primary) hypertension: Secondary | ICD-10-CM | POA: Diagnosis not present

## 2017-09-13 DIAGNOSIS — I4510 Unspecified right bundle-branch block: Secondary | ICD-10-CM

## 2017-09-13 DIAGNOSIS — R0600 Dyspnea, unspecified: Secondary | ICD-10-CM | POA: Insufficient documentation

## 2017-09-13 HISTORY — DX: Other forms of dyspnea: R06.09

## 2017-09-13 NOTE — Progress Notes (Signed)
Cardiology Office Note    Date:  09/13/2017   ID:  Richard [name], DOB 22-Feb-1946, MRN [id number]  PCP:  Vincente Liberty, MD  Cardiologist: Sinclair Grooms, MD   Chief Complaint  Patient presents with  . Congestive Heart Failure  . Shortness of Breath    History of Present Illness:  Richard [name] is a 72 y.o. 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   Has been troubled by progressive dyspnea on exertion over the past year.  Work-up including a myocardial perfusion stress test echocardiogram were reassuring.  He has gained weight.  He has been physically inactive.  He denies orthopnea, PND, and peripheral edema.  He snores.  States his entire family has sleep apnea.   Past Medical History:  Diagnosis Date  . Arthritis   . Asthma   . High cholesterol   . Hypertension   . Myocardial infarction [facility]) 2004   Dr. Tamala Julian, at [place], yearly visits    Past Surgical History:  Procedure Laterality Date  . CARDIAC CATHETERIZATION  2004   with 2 stents  . CARDIOVASCULAR STRESS TEST  2011  . COLONOSCOPY    . POLYPECTOMY  02/17/2011   Procedure: POLYPECTOMY NASAL;  Surgeon: Delsa Bern;  Location: [facility] OR;  Service: ENT;  Laterality: N/A;  . SINUS ENDO W/FUSION  02/17/2011   Procedure: ENDOSCOPIC SINUS SURGERY WITH FUSION NAVIGATION;  Surgeon: Delsa Bern;  Location: [facility] OR;  Service: ENT;  Laterality: N/A;  . TONSILLECTOMY    . US ECHOCARDIOGRAPHY      Current Medications: Outpatient Medications Prior to Visit  Medication Sig Dispense Refill  . albuterol (PROVENTIL HFA;VENTOLIN HFA) 108 (90 Base) MCG/ACT inhaler Inhale into the lungs every 6 (six) hours as needed for wheezing or shortness of breath.    Marland Kitchen amLODipine (NORVASC) 5 MG tablet Take 5 mg by mouth daily.  3  . aspirin EC 81 MG tablet Take 1 tablet (81 mg total) by mouth daily. 90 tablet 3  . atorvastatin  (LIPITOR) 40 MG tablet Take 40 mg by mouth at bedtime.  4  . carvedilol (COREG) 12.5 MG tablet Take 12.5 mg by mouth 2 (two) times daily.      . chlorthalidone (HYGROTON) 50 MG tablet Take 50 mg by mouth daily.  3  . fluticasone furoate-vilanterol (BREO ELLIPTA) 200-25 MCG/INH AEPB Inhale 1 puff into the lungs daily.    Marland Kitchen ipratropium-albuterol (DUONEB) 0.5-2.5 (3) MG/3ML SOLN Take 3 mLs by nebulization 3 (three) times daily. When feeling better change instructions to take every 6 hours if needed for wheezing or shortness of breath 360 mL 6  . montelukast (SINGULAIR) 10 MG tablet Take 10 mg by mouth at bedtime.    . nitroGLYCERIN (NITROSTAT) 0.4 MG SL tablet Place 1 tablet (0.4 mg total) under the tongue every 5 (five) minutes as needed. For chest pain 25 tablet 11  . omeprazole (PRILOSEC) 20 MG capsule Take 20 mg by mouth every evening.      No facility-administered medications prior to visit.      Allergies:   Beef-derived products; Chicken protein; Lisinopril; and Peanut-containing drug products   Social History   Socioeconomic History  . Marital status: Married    Spouse name: Not on file  . Number of children: Not on file  . Years of education: Not on file  . Highest education level: Not on  file  Occupational History  . Not on file  Social Needs  . Financial resource strain: Not on file  . Food insecurity:    Worry: Not on file    Inability: Not on file  . Transportation needs:    Medical: Not on file    Non-medical: Not on file  Tobacco Use  . Smoking status: Former Research scientist (life sciences)  . Smokeless tobacco: Never Used  Substance and Sexual Activity  . Alcohol use: Yes    Alcohol/week: 3.6 oz    Types: 2 Cans of beer, 4 Standard drinks or equivalent per week    Comment: 5-6 drinks/week  . Drug use: No  . Sexual activity: Not on file  Lifestyle  . Physical activity:    Days per week: Not on file    Minutes per session: Not on file  . Stress: Not on file  Relationships  . Social  connections:    Talks on phone: Not on file    Gets together: Not on file    Attends religious service: Not on file    Active member of club or organization: Not on file    Attends meetings of clubs or organizations: Not on file    Relationship status: Not on file  Other Topics Concern  . Not on file  Social History Narrative  . Not on file     Family History:  The patient's family history includes Alzheimer's disease in his mother; Cancer in his mother; Diabetes in his father and sister; Heart attack in his father; Hypertension in his sister.   ROS:   Please see the history of present illness.    Shortness of breath on exertion, dizziness, All other systems reviewed and are negative.   PHYSICAL EXAM:   VS:  BP 124/82   Pulse 93   Ht 5\' 8"  (1.727 m)   Wt 253 lb 1.9 oz (114.8 kg)   BMI 38.49 kg/m    GEN: Well nourished, well developed, in no acute distress  HEENT: normal  Neck: no JVD, carotid bruits, or masses Cardiac: RRR; no murmurs, rubs, or gallops,no edema  Respiratory:  clear to auscultation bilaterally, normal work of breathing GI: soft, nontender, nondistended, + BS MS: no deformity or atrophy  Skin: warm and dry, no rash Neuro:  Alert and Oriented x 3, Strength and sensation are intact Psych: euthymic mood, full affect  Wt Readings from Last 3 Encounters:  09/13/17 253 lb 1.9 oz (114.8 kg)  06/03/17 255 lb 12.8 oz (116 kg)  05/06/17 254 lb 6.4 oz (115.4 kg)      Studies/Labs Reviewed:   EKG:  EKG  Not repeated  Recent Labs: 05/18/2017: ALT 14; BUN 18; Creatinine, Ser 1.10; Hemoglobin 14.4; NT-Pro BNP 35; Platelets 248; Potassium 3.8; Sodium 137   Lipid Panel No results found for: CHOL, TRIG, HDL, CHOLHDL, VLDL, LDLCALC, LDLDIRECT  Additional studies/ records that were reviewed today include:  2D Doppler echocardiogram from February 2019: Study Conclusions  - Left ventricle: The cavity size was normal. There was moderate   concentric hypertrophy.  Systolic function was normal. The   estimated ejection fraction was in the range of 55% to 60%. Wall   motion was normal; there were no regional wall motion   abnormalities. Doppler parameters are consistent with abnormal   left ventricular relaxation (grade 1 diastolic dysfunction).   Doppler parameters are consistent with indeterminate ventricular   filling pressure. - Aortic valve: Transvalvular velocity was within the normal range.  There was no stenosis. There was no regurgitation. - Mitral valve: Transvalvular velocity was within the normal range.   There was no evidence for stenosis. There was trivial   regurgitation. - Right ventricle: The cavity size was normal. Wall thickness was   normal. - Atrial septum: No defect or patent foramen ovale was identified   by color flow Doppler. - Tricuspid valve: There was trivial regurgitation. - Pulmonary arteries: Systolic pressure was within the normal   range. - Global longitudinal strain -19.0% (normal).  Myocardial perfusion imaging 04/27/2017: Study Highlights     There was no ST segment deviation noted during stress.  The study is normal.  This is a low risk study.   Normal pharmacologic nuclear study with no evidence for prior infarct or ischemia.     ASSESSMENT:    1. Dyspnea on exertion   2. CAD in native artery   3. Hyperlipidemia, unspecified hyperlipidemia type   4. Essential hypertension   5. Right bundle branch block   6. Snoring      PLAN:  In order of problems listed above:  1. Uncertain etiology.  Could be simply related to deconditioning.  Encouraged starting aerobic activity at least 3 times per week.  I recommended moderate intensity walking.  He should walk fast enough to feel the physical activity but not so fast that he is unable to carry on a conversation.  He should build to achieve at least 30 minutes of continuous moderate aerobic activity per week with a target of 150 minutes/week.  Any  chest discomfort or problems should warrant notification and perhaps further evaluation. 2. Recent low risk myocardial perfusion study 3. LDL target less than 70 4. Excellent blood pressure control 5. Not addressed 6. We will perform a sleep study to rule out sleep apnea as a contributing factor to deconditioning/decreased energy/dyspnea   Follow-up in 1 year or earlier if problems.  Medication Adjustments/Labs and Tests Ordered: Current medicines are reviewed at length with the patient today.  Concerns regarding medicines are outlined above.  Medication changes, Labs and Tests ordered today are listed in the Patient Instructions below. Patient Instructions  Medication Instructions:  Your physician recommends that you continue on your current medications as directed. Please refer to the Current Medication list given to you today.  Labwork: None  Testing/Procedures: Your physician has recommended that you have a sleep study. This test records several body functions during sleep, including: brain activity, eye movement, oxygen and carbon dioxide blood levels, heart rate and rhythm, breathing rate and rhythm, the flow of air through your mouth and nose, snoring, body muscle movements, and chest and belly movement.   Follow-Up: Your physician wants you to follow-up in: 1 year with Dr. Tamala Julian. You will receive a reminder letter in the mail two months in advance. If you don't receive a letter, please call our office to schedule the follow-up appointment.   Any Other Special Instructions Will Be Listed Below (If Applicable).  Dr. Tamala Julian recommends that you walk at least 3 days per week for 10 minutes each time.     If you need a refill on your cardiac medications before your next appointment, please call your pharmacy.      Signed, Sinclair Grooms, MD  09/13/2017 2:50 PM    [facility] [address], [place], [place]  [address] Phone: [phone]; Fax: [phone]

## 2017-09-13 NOTE — Patient Instructions (Signed)
Medication Instructions:  Your physician recommends that you continue on your current medications as directed. Please refer to the Current Medication list given to you today.  Labwork: None  Testing/Procedures: Your physician has recommended that you have a sleep study. This test records several body functions during sleep, including: brain activity, eye movement, oxygen and carbon dioxide blood levels, heart rate and rhythm, breathing rate and rhythm, the flow of air through your mouth and nose, snoring, body muscle movements, and chest and belly movement.   Follow-Up: Your physician wants you to follow-up in: 1 year with Dr. Tamala Julian. You will receive a reminder letter in the mail two months in advance. If you don't receive a letter, please call our office to schedule the follow-up appointment.   Any Other Special Instructions Will Be Listed Below (If Applicable).  Dr. Tamala Julian recommends that you walk at least 3 days per week for 10 minutes each time.     If you need a refill on your cardiac medications before your next appointment, please call your pharmacy.

## 2017-09-14 ENCOUNTER — Telehealth: Payer: Self-pay | Admitting: *Deleted

## 2017-09-14 NOTE — Telephone Encounter (Signed)
PA submitted to [facility] for in lab sleep study.

## 2017-09-14 NOTE — Telephone Encounter (Signed)
-----   Message from Loren Racer, LPN sent at 06/09/3951  2:54 PM EDT ----- Placed order for sleep study.   Epworth was 2.    Thanks!

## 2017-09-14 NOTE — Telephone Encounter (Signed)
  -----   Message from Loren Racer, LPN sent at 04/13/1751  2:54 PM EDT ----- Placed order for sleep study.   Epworth was 2.    Thanks!

## 2017-09-23 ENCOUNTER — Telehealth: Payer: Self-pay | Admitting: *Deleted

## 2017-09-23 NOTE — Telephone Encounter (Signed)
-----   Message from Loren Racer, LPN sent at 08/15/4096  2:54 PM EDT ----- Placed order for sleep study.   Epworth was 2.    Thanks!

## 2017-09-23 NOTE — Telephone Encounter (Signed)
Staff message sent to [place]. Ok to schedule sleep study. [facility] approval received. AUTH # [id number] Valid 09/14/17 to 12/13/17.

## 2017-09-24 NOTE — Telephone Encounter (Signed)
Patient is scheduled for lab study on 10/21/17. Patient understands his sleep study will be done at [facility] sleep lab. Patient understands he will receive a sleep packet in a week or so. Patient understands to call if he does not receive the sleep packet in a timely manner. Patient agrees with treatment and thanked me for call.

## 2017-10-21 ENCOUNTER — Ambulatory Visit (HOSPITAL_BASED_OUTPATIENT_CLINIC_OR_DEPARTMENT_OTHER): Payer: PPO | Attending: Interventional Cardiology | Admitting: Cardiology

## 2017-10-21 VITALS — Ht 67.0 in | Wt 240.0 lb

## 2017-10-21 DIAGNOSIS — Z6838 Body mass index (BMI) 38.0-38.9, adult: Secondary | ICD-10-CM | POA: Diagnosis not present

## 2017-10-21 DIAGNOSIS — Z79899 Other long term (current) drug therapy: Secondary | ICD-10-CM | POA: Insufficient documentation

## 2017-10-21 DIAGNOSIS — Z7982 Long term (current) use of aspirin: Secondary | ICD-10-CM | POA: Insufficient documentation

## 2017-10-21 DIAGNOSIS — E669 Obesity, unspecified: Secondary | ICD-10-CM | POA: Insufficient documentation

## 2017-10-21 DIAGNOSIS — G4733 Obstructive sleep apnea (adult) (pediatric): Secondary | ICD-10-CM | POA: Diagnosis not present

## 2017-10-21 DIAGNOSIS — R0683 Snoring: Secondary | ICD-10-CM | POA: Diagnosis not present

## 2017-11-02 NOTE — Procedures (Signed)
   Patient Name: Aero, Drummonds Date:02/23/2017 10/21/2017 Gender: Male D.O.B: 1945-11-29 Age (years): 41 Referring Provider: Daneen Schick Height (inches): 28 Interpreting Physician: Fransico Him MD, ABSM Weight (lbs): 240 RPSGT: Baxter Flattery BMI: 38 MRN: [id number] Neck Size: 19.50  CLINICAL INFORMATION Sleep Study Type: NPSG  Indication for sleep study: Obesity, Snoring, Witnessed Apneas  Epworth Sleepiness Score: 1  SLEEP STUDY TECHNIQUE As per the AASM Manual for the Scoring of Sleep and Associated Events v2.3 (April 2016) with a hypopnea requiring 4% desaturations.  The channels recorded and monitored were frontal, central and occipital EEG, electrooculogram (EOG), submentalis EMG (chin), nasal and oral airflow, thoracic and abdominal wall motion, anterior tibialis EMG, snore microphone, electrocardiogram, and pulse oximetry.  MEDICATIONS Medications self-administered by patient taken the night of the study : COREG, LIPITOR, ASPIRIN, [place] The study was initiated at 10:41:14 PM and ended at 5:14:03 AM.  Sleep onset time was 32.5 minutes and the sleep efficiency was 66.5%%. The total sleep time was 261.3 minutes.  Stage REM latency was 145.0 minutes.  The patient spent 5.0%% of the night in stage N1 sleep, 66.9%% in stage N2 sleep, 0.0%% in stage N3 and 28.1% in REM.  Alpha intrusion was absent.  Supine sleep was 22.01%.  RESPIRATORY PARAMETERS The overall apnea/hypopnea index (AHI) was 13.5 per hour. There were 20 total apneas, including 16 obstructive, 0 central and 4 mixed apneas. There were 39 hypopneas and 0 RERAs.  The AHI during Stage REM sleep was 40.8 per hour.  AHI while supine was 2.1 per hour.  The mean oxygen saturation was 95.0%. The minimum SpO2 during sleep was 88.0%.  moderate snoring was noted during this study.  CARDIAC DATA The 2 lead EKG demonstrated sinus rhythm. The mean heart rate was 72.6 beats per  minute. Other EKG findings include: None.  LEG MOVEMENT DATA The total PLMS were 0 with a resulting PLMS index of 0.0. Associated arousal with leg movement index was 0.0 .  IMPRESSIONS - Mild obstructive sleep apnea occurred during this study (AHI = 13.5/h). - No significant central sleep apnea occurred during this study (CAI = 0.0/h). - The patient had minimal or no oxygen desaturation during the study (Min O2 = 88.0%) - The patient snored with moderate snoring volume. - No cardiac abnormalities were noted during this study. - Clinically significant periodic limb movements did not occur during sleep. No significant associated arousals.  DIAGNOSIS - Obstructive Sleep Apnea (327.23 [G47.33 ICD-10])  RECOMMENDATIONS - Therapeutic CPAP titration to determine optimal pressure required to alleviate sleep disordered breathing. - Avoid alcohol, sedatives and other CNS depressants that may worsen sleep apnea and disrupt normal sleep architecture. - Sleep hygiene should be reviewed to assess factors that may improve sleep quality. - Weight management and regular exercise should be initiated or continued if appropriate.  [Electronically signed] 11/02/2017 10:01 PM  Fransico Him MD, ABSM Diplomate, American Board of Sleep Medicine

## 2017-11-09 ENCOUNTER — Telehealth: Payer: Self-pay | Admitting: *Deleted

## 2017-11-09 DIAGNOSIS — I2510 Atherosclerotic heart disease of native coronary artery without angina pectoris: Secondary | ICD-10-CM

## 2017-11-09 DIAGNOSIS — I10 Essential (primary) hypertension: Secondary | ICD-10-CM

## 2017-11-09 NOTE — Telephone Encounter (Signed)
Called results lmtcb on cell. 

## 2017-11-09 NOTE — Telephone Encounter (Signed)
-----   Message from Sueanne Margarita, MD sent at 11/02/2017 10:05 PM EDT ----- Please let patient know that they have sleep apnea and recommend CPAP titration. Please set up titration in the sleep lab.

## 2017-11-19 ENCOUNTER — Telehealth: Payer: Self-pay | Admitting: *Deleted

## 2017-11-19 NOTE — Telephone Encounter (Signed)
PA for CPAP titration submitted vis HTA web portal.

## 2017-11-23 ENCOUNTER — Telehealth: Payer: Self-pay | Admitting: *Deleted

## 2017-11-23 NOTE — Telephone Encounter (Signed)
-----   Message from Freada Bergeron, [place] sent at 11/19/2017  8:23 AM EDT ----- Regarding: pre cert cpap titration

## 2017-11-23 NOTE — Telephone Encounter (Signed)
Received a call from [place] with [facility] saying the titration study was previously already approved. Staff message sent to Gae Bon ok to schedule.

## 2017-11-24 ENCOUNTER — Telehealth: Payer: Self-pay | Admitting: *Deleted

## 2017-11-24 ENCOUNTER — Encounter: Payer: Self-pay | Admitting: *Deleted

## 2017-11-24 NOTE — Telephone Encounter (Signed)
-----   Message from Lauralee Evener, [place] sent at 11/23/2017 10:44 AM EDT ----- Regarding: RE: pre cert Per HTA already approved. Ok to schedule CPAP titration. ----- Message ----- From: Freada Bergeron, CMA Sent: 11/19/2017   8:23 AM EDT To: Cv Div Sleep Studies Subject: pre cert                                       cpap titration

## 2017-11-24 NOTE — Telephone Encounter (Addendum)
Patient is scheduled for CPAP Titration on 12/30/17. Patient understands his titration study will be done at [facility] sleep lab. Patient understands he will receive a letter in a week or so detailing appointment, date, time, and location. Patient understands to call if he does not receive the letter  in a timely manner. Patient agrees with treatment and thanked me for call.

## 2017-11-24 NOTE — Telephone Encounter (Signed)
Informed patient of sleep study results and patient understanding was verbalized. Patient understands his sleep study showed he has sleep apnea and recommend CPAP titration. Pt is aware and agreeable to these results.

## 2017-12-30 ENCOUNTER — Ambulatory Visit (HOSPITAL_BASED_OUTPATIENT_CLINIC_OR_DEPARTMENT_OTHER): Payer: PPO | Attending: Cardiology | Admitting: Cardiology

## 2017-12-30 VITALS — Ht 68.0 in | Wt 253.0 lb

## 2017-12-30 DIAGNOSIS — I2510 Atherosclerotic heart disease of native coronary artery without angina pectoris: Secondary | ICD-10-CM

## 2017-12-30 DIAGNOSIS — I10 Essential (primary) hypertension: Secondary | ICD-10-CM

## 2017-12-30 DIAGNOSIS — G4733 Obstructive sleep apnea (adult) (pediatric): Secondary | ICD-10-CM | POA: Diagnosis not present

## 2018-01-03 NOTE — Procedures (Signed)
   Patient Name: Richard [name], Forget Date: 12/30/2017 Gender: Male D.O.B: [birth year] Age (years): 63 Referring Provider: Daneen Schick Height (inches): 6 Interpreting Physician: Fransico Him MD, ABSM Weight (lbs): 253 RPSGT: [place], [place] BMI: 38 MRN: [id number] Neck Size: 19.50  CLINICAL INFORMATION The patient is referred for a BiPAP titration to treat sleep apnea.  SLEEP STUDY TECHNIQUE As per the AASM Manual for the Scoring of Sleep and Associated Events v2.3 (April 2016) with a hypopnea requiring 4% desaturations.  The channels recorded and monitored were frontal, central and occipital EEG, electrooculogram (EOG), submentalis EMG (chin), nasal and oral airflow, thoracic and abdominal wall motion, anterior tibialis EMG, snore microphone, electrocardiogram, and pulse oximetry. Bilevel positive airway pressure (BPAP) was initiated at the beginning of the study and titrated to treat sleep-disordered breathing.  MEDICATIONS Medications self-administered by patient taken the night of the study : COREG, LIPITOR, ASPIRIN, NORVASC, Breo Ellipta  RESPIRATORY PARAMETERS Optimal IPAP Pressure (cm): 13  AHI at Optimal Pressure (/hr) 2.1 Optimal EPAP Pressure (cm):8  Overall Minimal O2 (%):87.0  Minimal O2 at Optimal Pressure (%): 93.0  SLEEP ARCHITECTURE Start Time:11:12:40 PM  Stop Time:5:13:13 AM  Total Time (min):360.5  Total Sleep Time (min):75.1 Sleep Latency (min):157.5  Sleep Efficiency (%):20.8%  REM Latency (min):147.0 WASO (min):128.0 Stage N1 (%): 27.4%  Stage N2 (%): 20.0%  Stage N3 (%): 0.0%  Stage R (%):52.6 Supine (%):9.00  Arousal Index (/hr):30.4    CARDIAC DATA The 2 lead EKG demonstrated sinus rhythm. The mean heart rate was 67.4 beats per minute. Other EKG findings include: None.  LEG MOVEMENT DATA The total Periodic Limb Movements of Sleep (PLMS) were 0. The PLMS index was 0.0. A PLMS index of <15 is considered normal in  adults.  IMPRESSIONS - An optimal PAP pressure of 13/9cm H2O was selected for this patient.   - Central sleep apnea was not noted during this titration (CAI = 4.8/h). - Mild oxygen desaturations were observed during this titration (min O2 = 87.0%). - No snoring was audible during this study. - No cardiac abnormalities were observed during this study. - Clinically significant periodic limb movements were not noted during this study. Arousals associated with PLMs were rare.  DIAGNOSIS - Obstructive Sleep Apnea (327.23 [G47.33 ICD-10])  RECOMMENDATIONS - Recommend Airsense BiPAP at 13/9cm H2O with heated humidity and mask of choice.  - Avoid alcohol, sedatives and other CNS depressants that may worsen sleep apnea and disrupt normal sleep architecture. - Sleep hygiene should be reviewed to assess factors that may improve sleep quality. - Weight management and regular exercise should be initiated or continued. - Return to Sleep Center for re-evaluation after 10 weeks of therapy  [Electronically signed] 01/03/2018 10:18 PM  Fransico Him MD, ABSM Diplomate, American Board of Sleep Medicine

## 2018-01-07 ENCOUNTER — Telehealth: Payer: Self-pay | Admitting: *Deleted

## 2018-01-07 NOTE — Telephone Encounter (Signed)
Informed patient of sleep study results and patient understanding was verbalized. Patient understands his sleep study showed they had a successful PAP titration and orders are in Epic. Upon patient request DME selection is [facility]. Patient understands he will be contacted by [facility] to set up his cpap. Patient understands to call if [facility] does not contact him with new setup in a timely manner. Patient understands they will be called once confirmation has been received from [facility] that they have received their new machine to schedule 10 week follow up appointment.  [facility] notified of new cpap order  Please add to airview Patient was grateful for the call and thanked me. 

## 2018-01-07 NOTE — Telephone Encounter (Signed)
-----   Message from Sueanne Margarita, MD sent at 01/03/2018 10:23 PM EDT ----- Please let patient know that they had a successful PAP titration and let DME know that orders are in EPIC.  Please set up 10 week OV with me.

## 2018-01-18 DIAGNOSIS — K589 Irritable bowel syndrome without diarrhea: Secondary | ICD-10-CM | POA: Diagnosis not present

## 2018-01-18 DIAGNOSIS — K210 Gastro-esophageal reflux disease with esophagitis: Secondary | ICD-10-CM | POA: Diagnosis not present

## 2018-01-18 DIAGNOSIS — J4540 Moderate persistent asthma, uncomplicated: Secondary | ICD-10-CM | POA: Diagnosis not present

## 2018-01-18 DIAGNOSIS — I5030 Unspecified diastolic (congestive) heart failure: Secondary | ICD-10-CM | POA: Diagnosis not present

## 2018-01-18 DIAGNOSIS — J441 Chronic obstructive pulmonary disease with (acute) exacerbation: Secondary | ICD-10-CM | POA: Diagnosis not present

## 2018-01-18 DIAGNOSIS — Z79899 Other long term (current) drug therapy: Secondary | ICD-10-CM | POA: Diagnosis not present

## 2018-01-18 DIAGNOSIS — I2510 Atherosclerotic heart disease of native coronary artery without angina pectoris: Secondary | ICD-10-CM | POA: Diagnosis not present

## 2018-01-18 DIAGNOSIS — G4733 Obstructive sleep apnea (adult) (pediatric): Secondary | ICD-10-CM | POA: Diagnosis not present

## 2018-01-18 DIAGNOSIS — E7800 Pure hypercholesterolemia, unspecified: Secondary | ICD-10-CM | POA: Diagnosis not present

## 2018-01-18 DIAGNOSIS — I252 Old myocardial infarction: Secondary | ICD-10-CM | POA: Diagnosis not present

## 2018-01-18 DIAGNOSIS — E669 Obesity, unspecified: Secondary | ICD-10-CM | POA: Diagnosis not present

## 2018-01-18 DIAGNOSIS — I119 Hypertensive heart disease without heart failure: Secondary | ICD-10-CM | POA: Diagnosis not present

## 2018-02-22 DIAGNOSIS — G4733 Obstructive sleep apnea (adult) (pediatric): Secondary | ICD-10-CM | POA: Diagnosis not present

## 2018-03-25 DIAGNOSIS — G4733 Obstructive sleep apnea (adult) (pediatric): Secondary | ICD-10-CM | POA: Diagnosis not present

## 2018-03-28 NOTE — Telephone Encounter (Signed)
Patient has a 10 week follow up appointment scheduled for 04/25/18. Patient understands he needs to keep this appointment for insurance compliance. Patient was grateful for the call and thanked me.

## 2018-04-22 DIAGNOSIS — G4733 Obstructive sleep apnea (adult) (pediatric): Secondary | ICD-10-CM | POA: Diagnosis not present

## 2018-04-24 ENCOUNTER — Encounter: Payer: Self-pay | Admitting: Cardiology

## 2018-04-24 DIAGNOSIS — G4733 Obstructive sleep apnea (adult) (pediatric): Secondary | ICD-10-CM

## 2018-04-24 DIAGNOSIS — E669 Obesity, unspecified: Secondary | ICD-10-CM | POA: Insufficient documentation

## 2018-04-24 HISTORY — DX: Obstructive sleep apnea (adult) (pediatric): G47.33

## 2018-04-24 NOTE — Progress Notes (Signed)
Cardiology Office Note:    Date:  04/25/2018   ID:  Richard [name], DOB 1945-10-15, MRN [id number]  PCP:  Vincente Liberty, MD  Cardiologist:  Sinclair Grooms, MD    Referring MD: Vincente Liberty, MD   Chief Complaint  Patient presents with  . Sleep Apnea  . Hypertension    History of Present Illness:    Richard [name] is a 73 y.o. male with a hx of Obesity, snoring and witnessed Apnea.  He was referred for sleep study which showed mild OSA with an AHI of 13.5/hr and O2 sats of 88% with events.  He underwent BiPAP titration to 13/9cm H2O.  He is doing well with his CPAP device and thinks that he has gotten used to it although it is taken a while.Marland Kitchen  He tolerates the mask and feels the pressure is adequate.  Since going on CPAP he has not really noticed any improvement in daytime fatigue but he says he gets up a lot to urinate at night and this is likely disrupting his sleep.  He says he does not think he is snoring.  He has had some issues with mouth dryness but he says is not too bad and he will just take a drink when he gets  up to the bathroom at night   Past Medical History:  Diagnosis Date  . Arthritis   . Asthma   . High cholesterol   . Hypertension   . Myocardial infarction [facility]) 2004   Dr. Tamala Julian, at [place], yearly visits  . OSA treated with BiPAP 04/24/2018   Mild OSA with an AHI of 13.5/hr and O2 sats of 88%. Now on BiPAP at 13/9cm H2O.     Past Surgical History:  Procedure Laterality Date  . CARDIAC CATHETERIZATION  2004   with 2 stents  . CARDIOVASCULAR STRESS TEST  2011  . COLONOSCOPY    . POLYPECTOMY  02/17/2011   Procedure: POLYPECTOMY NASAL;  Surgeon: Delsa Bern;  Location: [facility];  Service: ENT;  Laterality: N/A;  . SINUS ENDO W/FUSION  02/17/2011   Procedure: ENDOSCOPIC SINUS SURGERY WITH FUSION NAVIGATION;  Surgeon: Delsa Bern;  Location: [facility];  Service: ENT;  Laterality: N/A;  . TONSILLECTOMY    . US ECHOCARDIOGRAPHY       Current Medications: Current Meds  Medication Sig  . albuterol (PROVENTIL HFA;VENTOLIN HFA) 108 (90 Base) MCG/ACT inhaler Inhale into the lungs every 6 (six) hours as needed for wheezing or shortness of breath.  Marland Kitchen amLODipine (NORVASC) 5 MG tablet Take 5 mg by mouth daily.  Marland Kitchen aspirin EC 81 MG tablet Take 1 tablet (81 mg total) by mouth daily.  Marland Kitchen atorvastatin (LIPITOR) 40 MG tablet Take 40 mg by mouth at bedtime.  . carvedilol (COREG) 12.5 MG tablet Take 12.5 mg by mouth 2 (two) times daily.    . chlorthalidone (HYGROTON) 50 MG tablet Take 50 mg by mouth daily.  . fluticasone furoate-vilanterol (BREO ELLIPTA) 200-25 MCG/INH AEPB Inhale 1 puff into the lungs daily.  Marland Kitchen ipratropium-albuterol (DUONEB) 0.5-2.5 (3) MG/3ML SOLN Take 3 mLs by nebulization 3 (three) times daily. When feeling better change instructions to take every 6 hours if needed for wheezing or shortness of breath (Patient taking differently: Take 3 mLs by nebulization 2 (two) times daily. When feeling better change instructions to take every 6 hours if needed for wheezing or shortness of breath)  . montelukast (SINGULAIR) 10 MG tablet Take 10 mg by mouth at  bedtime.  . nitroGLYCERIN (NITROSTAT) 0.4 MG SL tablet Place 1 tablet (0.4 mg total) under the tongue every 5 (five) minutes as needed. For chest pain  . omeprazole (PRILOSEC) 40 MG capsule Take 40 mg by mouth daily.     Allergies:   Beef-derived products; Chicken protein; Lisinopril; and Peanut-containing drug products   Social History   Socioeconomic History  . Marital status: Married    Spouse name: Not on file  . Number of children: Not on file  . Years of education: Not on file  . Highest education level: Not on file  Occupational History  . Not on file  Social Needs  . Financial resource strain: Not on file  . Food insecurity:    Worry: Not on file    Inability: Not on file  . Transportation needs:    Medical: Not on file    Non-medical: Not on file   Tobacco Use  . Smoking status: Former Research scientist (life sciences)  . Smokeless tobacco: Never Used  Substance and Sexual Activity  . Alcohol use: Yes    Alcohol/week: 6.0 standard drinks    Types: 2 Cans of beer, 4 Standard drinks or equivalent per week    Comment: 5-6 drinks/week  . Drug use: No  . Sexual activity: Not on file  Lifestyle  . Physical activity:    Days per week: Not on file    Minutes per session: Not on file  . Stress: Not on file  Relationships  . Social connections:    Talks on phone: Not on file    Gets together: Not on file    Attends religious service: Not on file    Active member of club or organization: Not on file    Attends meetings of clubs or organizations: Not on file    Relationship status: Not on file  Other Topics Concern  . Not on file  Social History Narrative  . Not on file     Family History: The patient's family history includes Alzheimer's disease in his mother; Cancer in his mother; Diabetes in his father and sister; Heart attack in his father; Hypertension in his sister.  ROS:   Please see the history of present illness.    ROS  All other systems reviewed and negative.   EKGs/Labs/Other Studies Reviewed:    The following studies were reviewed today: PAP download  EKG:  EKG is not ordered today.   Recent Labs: 05/18/2017: ALT 14; BUN 18; Creatinine, Ser 1.10; Hemoglobin 14.4; NT-Pro BNP 35; Platelets 248; Potassium 3.8; Sodium 137   Recent Lipid Panel No results found for: CHOL, TRIG, HDL, CHOLHDL, VLDL, LDLCALC, LDLDIRECT  Physical Exam:    VS:  Ht 5\' 8"  (1.727 m)   Wt 257 lb 3.2 oz (116.7 kg)   BMI 39.11 kg/m     Wt Readings from Last 3 Encounters:  04/25/18 257 lb 3.2 oz (116.7 kg)  12/30/17 253 lb (114.8 kg)  10/21/17 240 lb (108.9 kg)     GEN:  Well nourished, well developed in no acute distress HEENT: Normal NECK: No JVD; No carotid bruits LYMPHATICS: No lymphadenopathy CARDIAC: RRR, no murmurs, rubs, gallops RESPIRATORY:   Clear to auscultation without rales, wheezing or rhonchi  ABDOMEN: Soft, non-tender, non-distended MUSCULOSKELETAL:  No edema; No deformity  SKIN: Warm and dry NEUROLOGIC:  Alert and oriented x 3 PSYCHIATRIC:  Normal affect   ASSESSMENT:    1. OSA treated with BiPAP   2. Essential hypertension   3. Obesity (  BMI 30-39.9)    PLAN:    In order of problems listed above:  1. OSA - the patient is tolerating PAP therapy well without any problems. The PAP download was reviewed today and showed an AHI of 0.8/hr on 13/9 cm H2O with 93% compliance in using more than 4 hours nightly.  The patient has been using and benefiting from PAP use and will continue to benefit from therapy. We discussed how to adjust the humidity control to decrease mouth dryness.  I encouraged him to try to change his mask once a month so that we do not have issues with mask leakage.  2.  HTN - BP well controlled on exam.  He will continue on Carvedilol 12.5mg  BID, amlodipine 5mg  daily and Chlorthalidone 50mg  daily.   3.  Obesity - I have encouraged him to get into a routine exercise program and cut back on carbs and portions.    Medication Adjustments/Labs and Tests Ordered: Current medicines are reviewed at length with the patient today.  Concerns regarding medicines are outlined above.  No orders of the defined types were placed in this encounter.  No orders of the defined types were placed in this encounter.   Signed, Fransico Him, MD  04/25/2018 1:42 PM    [place]

## 2018-04-25 ENCOUNTER — Encounter: Payer: Self-pay | Admitting: Cardiology

## 2018-04-25 ENCOUNTER — Ambulatory Visit: Payer: PPO | Admitting: Cardiology

## 2018-04-25 VITALS — BP 110/60 | HR 89 | Ht 68.0 in | Wt 257.2 lb

## 2018-04-25 DIAGNOSIS — G4733 Obstructive sleep apnea (adult) (pediatric): Secondary | ICD-10-CM | POA: Diagnosis not present

## 2018-04-25 DIAGNOSIS — I10 Essential (primary) hypertension: Secondary | ICD-10-CM | POA: Diagnosis not present

## 2018-04-25 DIAGNOSIS — E669 Obesity, unspecified: Secondary | ICD-10-CM | POA: Diagnosis not present

## 2018-04-25 NOTE — Patient Instructions (Signed)

## 2018-05-24 DIAGNOSIS — T23039A Burn of unspecified degree of unspecified multiple fingers (nail), not including thumb, initial encounter: Secondary | ICD-10-CM | POA: Diagnosis not present

## 2018-05-24 DIAGNOSIS — G4733 Obstructive sleep apnea (adult) (pediatric): Secondary | ICD-10-CM | POA: Diagnosis not present

## 2018-05-24 DIAGNOSIS — E7800 Pure hypercholesterolemia, unspecified: Secondary | ICD-10-CM | POA: Diagnosis not present

## 2018-05-24 DIAGNOSIS — I2510 Atherosclerotic heart disease of native coronary artery without angina pectoris: Secondary | ICD-10-CM | POA: Diagnosis not present

## 2018-05-24 DIAGNOSIS — J441 Chronic obstructive pulmonary disease with (acute) exacerbation: Secondary | ICD-10-CM | POA: Diagnosis not present

## 2018-05-24 DIAGNOSIS — J4540 Moderate persistent asthma, uncomplicated: Secondary | ICD-10-CM | POA: Diagnosis not present

## 2018-05-24 DIAGNOSIS — I252 Old myocardial infarction: Secondary | ICD-10-CM | POA: Diagnosis not present

## 2018-05-24 DIAGNOSIS — G4730 Sleep apnea, unspecified: Secondary | ICD-10-CM | POA: Diagnosis not present

## 2018-05-24 DIAGNOSIS — I119 Hypertensive heart disease without heart failure: Secondary | ICD-10-CM | POA: Diagnosis not present

## 2018-05-24 DIAGNOSIS — M609 Myositis, unspecified: Secondary | ICD-10-CM | POA: Diagnosis not present

## 2018-05-24 DIAGNOSIS — M79645 Pain in left finger(s): Secondary | ICD-10-CM | POA: Diagnosis not present

## 2018-05-24 DIAGNOSIS — I110 Hypertensive heart disease with heart failure: Secondary | ICD-10-CM | POA: Diagnosis not present

## 2018-05-24 DIAGNOSIS — Z79899 Other long term (current) drug therapy: Secondary | ICD-10-CM | POA: Diagnosis not present

## 2018-05-24 DIAGNOSIS — Z125 Encounter for screening for malignant neoplasm of prostate: Secondary | ICD-10-CM | POA: Diagnosis not present

## 2018-05-24 DIAGNOSIS — I5030 Unspecified diastolic (congestive) heart failure: Secondary | ICD-10-CM | POA: Diagnosis not present

## 2018-05-24 DIAGNOSIS — E669 Obesity, unspecified: Secondary | ICD-10-CM | POA: Diagnosis not present

## 2018-06-24 DIAGNOSIS — G4733 Obstructive sleep apnea (adult) (pediatric): Secondary | ICD-10-CM | POA: Diagnosis not present

## 2018-07-24 DIAGNOSIS — G4733 Obstructive sleep apnea (adult) (pediatric): Secondary | ICD-10-CM | POA: Diagnosis not present

## 2018-07-26 DIAGNOSIS — G4733 Obstructive sleep apnea (adult) (pediatric): Secondary | ICD-10-CM | POA: Diagnosis not present

## 2018-08-24 DIAGNOSIS — G4733 Obstructive sleep apnea (adult) (pediatric): Secondary | ICD-10-CM | POA: Diagnosis not present

## 2018-08-25 DIAGNOSIS — E7800 Pure hypercholesterolemia, unspecified: Secondary | ICD-10-CM | POA: Diagnosis not present

## 2018-08-25 DIAGNOSIS — Z79899 Other long term (current) drug therapy: Secondary | ICD-10-CM | POA: Diagnosis not present

## 2018-08-25 DIAGNOSIS — J441 Chronic obstructive pulmonary disease with (acute) exacerbation: Secondary | ICD-10-CM | POA: Diagnosis not present

## 2018-08-25 DIAGNOSIS — J4540 Moderate persistent asthma, uncomplicated: Secondary | ICD-10-CM | POA: Diagnosis not present

## 2018-09-23 DIAGNOSIS — G4733 Obstructive sleep apnea (adult) (pediatric): Secondary | ICD-10-CM | POA: Diagnosis not present

## 2018-10-24 DIAGNOSIS — G4733 Obstructive sleep apnea (adult) (pediatric): Secondary | ICD-10-CM | POA: Diagnosis not present

## 2018-11-02 DIAGNOSIS — G4733 Obstructive sleep apnea (adult) (pediatric): Secondary | ICD-10-CM | POA: Diagnosis not present

## 2018-11-22 DIAGNOSIS — I2510 Atherosclerotic heart disease of native coronary artery without angina pectoris: Secondary | ICD-10-CM | POA: Diagnosis not present

## 2018-11-22 DIAGNOSIS — G4733 Obstructive sleep apnea (adult) (pediatric): Secondary | ICD-10-CM | POA: Diagnosis not present

## 2018-11-22 DIAGNOSIS — J4540 Moderate persistent asthma, uncomplicated: Secondary | ICD-10-CM | POA: Diagnosis not present

## 2018-11-22 DIAGNOSIS — K210 Gastro-esophageal reflux disease with esophagitis: Secondary | ICD-10-CM | POA: Diagnosis not present

## 2018-11-22 DIAGNOSIS — E669 Obesity, unspecified: Secondary | ICD-10-CM | POA: Diagnosis not present

## 2018-11-22 DIAGNOSIS — Z79899 Other long term (current) drug therapy: Secondary | ICD-10-CM | POA: Diagnosis not present

## 2018-11-22 DIAGNOSIS — E7800 Pure hypercholesterolemia, unspecified: Secondary | ICD-10-CM | POA: Diagnosis not present

## 2018-11-22 DIAGNOSIS — I252 Old myocardial infarction: Secondary | ICD-10-CM | POA: Diagnosis not present

## 2018-11-22 DIAGNOSIS — J441 Chronic obstructive pulmonary disease with (acute) exacerbation: Secondary | ICD-10-CM | POA: Diagnosis not present

## 2018-11-22 DIAGNOSIS — R350 Frequency of micturition: Secondary | ICD-10-CM | POA: Diagnosis not present

## 2018-11-22 DIAGNOSIS — I119 Hypertensive heart disease without heart failure: Secondary | ICD-10-CM | POA: Diagnosis not present

## 2018-11-22 DIAGNOSIS — I5030 Unspecified diastolic (congestive) heart failure: Secondary | ICD-10-CM | POA: Diagnosis not present

## 2018-11-24 DIAGNOSIS — G4733 Obstructive sleep apnea (adult) (pediatric): Secondary | ICD-10-CM | POA: Diagnosis not present

## 2018-12-07 NOTE — Progress Notes (Signed)
Cardiology Office Note:    Date:  12/09/2018   ID:  Richard [name], DOB 1945-09-11, MRN [id number]  PCP:  Vincente Liberty, MD  Cardiologist:  Sinclair Grooms, MD   Referring MD: Vincente Liberty, MD   Chief Complaint  Patient presents with  . Coronary Artery Disease    History of Present Illness:    Richard [name] is a 73 y.o. 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   Hever is not having angina, dyspnea, or equivalent symptom.  He is sedentary and readily admits this.  He has no orthopnea or PND.  Relatively new diagnosis of obstructive sleep apnea.  Compliance with CPAP is difficult because of inability to tolerate equipment on his face.\  There is no peripheral edema.  He has not had palpitations.  No transient neurological symptoms.  No medication side effects.  I did review his most recent lipid panel from March and the LDL was above target.  Past Medical History:  Diagnosis Date  . Arthritis   . Asthma   . High cholesterol   . Hypertension   . Myocardial infarction [facility]) 2004   Dr. Tamala Julian, at [place], yearly visits  . OSA treated with BiPAP 04/24/2018   Mild OSA with an AHI of 13.5/hr and O2 sats of 88%. Now on BiPAP at 13/9cm H2O.     Past Surgical History:  Procedure Laterality Date  . CARDIAC CATHETERIZATION  2004   with 2 stents  . CARDIOVASCULAR STRESS TEST  2011  . COLONOSCOPY    . POLYPECTOMY  02/17/2011   Procedure: POLYPECTOMY NASAL;  Surgeon: Delsa Bern;  Location: [facility];  Service: ENT;  Laterality: N/A;  . SINUS ENDO W/FUSION  02/17/2011   Procedure: ENDOSCOPIC SINUS SURGERY WITH FUSION NAVIGATION;  Surgeon: Delsa Bern;  Location: [facility];  Service: ENT;  Laterality: N/A;  . TONSILLECTOMY    . US ECHOCARDIOGRAPHY      Current Medications: Current Meds  Medication Sig  . albuterol (PROVENTIL HFA;VENTOLIN HFA) 108 (90  Base) MCG/ACT inhaler Inhale into the lungs every 6 (six) hours as needed for wheezing or shortness of breath.  Marland Kitchen amLODipine (NORVASC) 5 MG tablet Take 5 mg by mouth daily.  Marland Kitchen aspirin EC 81 MG tablet Take 1 tablet (81 mg total) by mouth daily.  . carvedilol (COREG) 12.5 MG tablet Take 12.5 mg by mouth 2 (two) times daily.    . chlorthalidone (HYGROTON) 50 MG tablet Take 50 mg by mouth daily.  . fluticasone furoate-vilanterol (BREO ELLIPTA) 200-25 MCG/INH AEPB Inhale 1 puff into the lungs daily.  Marland Kitchen ipratropium-albuterol (DUONEB) 0.5-2.5 (3) MG/3ML SOLN Take 3 mLs by nebulization 3 (three) times daily. When feeling better change instructions to take every 6 hours if needed for wheezing or shortness of breath  . montelukast (SINGULAIR) 10 MG tablet Take 10 mg by mouth at bedtime.  . nitroGLYCERIN (NITROSTAT) 0.4 MG SL tablet Place 1 tablet (0.4 mg total) under the tongue every 5 (five) minutes as needed. For chest pain  . omeprazole (PRILOSEC) 40 MG capsule Take 40 mg by mouth daily.  . [DISCONTINUED] atorvastatin (LIPITOR) 40 MG tablet Take 40 mg by mouth at bedtime.  . [DISCONTINUED] nitroGLYCERIN (NITROSTAT) 0.4 MG SL tablet Place 1 tablet (0.4 mg total) under the tongue every 5 (five) minutes as needed. For chest pain  Allergies:   Beef-derived products, Chicken protein, Lisinopril, and Peanut-containing drug products   Social History   Socioeconomic History  . Marital status: Married    Spouse name: Not on file  . Number of children: Not on file  . Years of education: Not on file  . Highest education level: Not on file  Occupational History  . Not on file  Social Needs  . Financial resource strain: Not on file  . Food insecurity    Worry: Not on file    Inability: Not on file  . Transportation needs    Medical: Not on file    Non-medical: Not on file  Tobacco Use  . Smoking status: Former Research scientist (life sciences)  . Smokeless tobacco: Never Used  Substance and Sexual Activity  . Alcohol use:  Yes    Alcohol/week: 6.0 standard drinks    Types: 2 Cans of beer, 4 Standard drinks or equivalent per week    Comment: 5-6 drinks/week  . Drug use: No  . Sexual activity: Not on file  Lifestyle  . Physical activity    Days per week: Not on file    Minutes per session: Not on file  . Stress: Not on file  Relationships  . Social Herbalist on phone: Not on file    Gets together: Not on file    Attends religious service: Not on file    Active member of club or organization: Not on file    Attends meetings of clubs or organizations: Not on file    Relationship status: Not on file  Other Topics Concern  . Not on file  Social History Narrative  . Not on file     Family History: The patient's family history includes Alzheimer's disease in his mother; Cancer in his mother; Diabetes in his father and sister; Heart attack in his father; Hypertension in his sister.  ROS:   Please see the history of present illness.    Sedentary.  Enjoys reading.  Does not exercise.  All other systems reviewed and are negative.  EKGs/Labs/Other Studies Reviewed:    The following studies were reviewed today:  Stress myocardial perfusion imaging February 2019: Study Highlights    There was no ST segment deviation noted during stress.  The study is normal.  This is a low risk study.   Normal pharmacologic nuclear study with no evidence for prior infarct or ischemia.     EKG:  EKG right bundle branch block, sinus rhythm, and when compared to the prior tracing from 2019, the heart rate is now slower.  Otherwise no change.  Recent Labs: No results found for requested labs within last 8760 hours.  Recent Lipid Panel No results found for: CHOL, TRIG, HDL, CHOLHDL, VLDL, LDLCALC, LDLDIRECT  Physical Exam:    VS:  BP 128/72   Pulse 76   Ht 5\' 8"  (1.727 m)   Wt 260 lb 9.6 oz (118.2 kg)   SpO2 99%   BMI 39.62 kg/m     Wt Readings from Last 3 Encounters:  12/09/18 260 lb 9.6  oz (118.2 kg)  04/25/18 257 lb 3.2 oz (116.7 kg)  12/30/17 253 lb (114.8 kg)     GEN: Obese. No acute distress HEENT: Normal NECK: No JVD. LYMPHATICS: No lymphadenopathy CARDIAC:  RRR without murmur, gallop, or edema. VASCULAR:  Normal Pulses. No bruits. RESPIRATORY:  Clear to auscultation without rales, wheezing or rhonchi  ABDOMEN: Soft, non-tender, non-distended, No pulsatile mass, MUSCULOSKELETAL: No deformity  SKIN: Warm and dry NEUROLOGIC:  Alert and oriented x 3 PSYCHIATRIC:  Normal affect   ASSESSMENT:    1. CAD in native artery   2. OSA treated with BiPAP   3. Essential hypertension   4. Right bundle branch block   5. Hyperlipidemia, unspecified hyperlipidemia type   6. Educated about COVID-19 virus infection    PLAN:    In order of problems listed above:  1. He denies angina.  Normal myocardial perfusion imaging study done 1 year ago.  Secondary prevention is discussed.  Falling short of aerobic activity and LDL cholesterol is too high. 2. Encourage compliance with CPAP.  He is having difficulty and on average feels sleeping less now than before the diagnosis was made because of the aggravation of equipment on his face. 3. Target blood pressure 130/80 mmHg 4. EKG is unchanged 5. Increase atorvastatin to 80 mg/day.  Liver and lipid panel 6 weeks later.  Target less than 70. 6. Social distancing, handwashing, and mask wearing is encouraged.  Overall education and awareness concerning primary/secondary risk prevention was discussed in detail: LDL less than 70, hemoglobin A1c less than 7, blood pressure target less than 130/80 mmHg, >150 minutes of moderate aerobic activity per week, avoidance of smoking, weight control (via diet and exercise), and continued surveillance/management of/for obstructive sleep apnea.  Liver and lipid panel mid November.   Medication Adjustments/Labs and Tests Ordered: Current medicines are reviewed at length with the patient today.   Concerns regarding medicines are outlined above.  Orders Placed This Encounter  Procedures  . Hepatic function panel  . Lipid panel  . EKG 12-Lead   Meds ordered this encounter  Medications  . nitroGLYCERIN (NITROSTAT) 0.4 MG SL tablet    Sig: Place 1 tablet (0.4 mg total) under the tongue every 5 (five) minutes as needed. For chest pain    Dispense:  25 tablet    Refill:  5  . atorvastatin (LIPITOR) 80 MG tablet    Sig: Take 1 tablet (80 mg total) by mouth daily.    Dispense:  90 tablet    Refill:  3    Patient Instructions  Medication Instructions:  1) INCREASE Atorvastatin to 80mg  once daily  If you need a refill on your cardiac medications before your next appointment, please call your pharmacy.   Lab work: Your physician recommends that you return for lab work in: 6 weeks.  You will need to be fasting for these labs (nothing o   If you have labs (blood work) drawn today and your tests are completely normal, you will receive your results only by: Marland Kitchen MyChart Message (if you have MyChart) OR . A paper copy in the mail If you have any lab test that is abnormal or we need to change your treatment, we will call you to review the results.  Testing/Procedures: None  Follow-Up: At [facility], you and your health needs are our priority.  As part of our continuing mission to provide you with exceptional heart care, we have created designated Provider Care Teams.  These Care Teams include your primary Cardiologist (physician) and Advanced Practice Providers (APPs -  Physician Assistants and Nurse Practitioners) who all work together to provide you with the care you need, when you need it. You will need a follow up appointment in 12 months.  Please call our office 2 months in advance to schedule this appointment.  You may see Sinclair Grooms, MD or one of the following Advanced  Practice Providers on your designated Care Team:   Truitt Merle, NP Cecilie Kicks, NP . Kathyrn Drown,  NP  Any Other Special Instructions Will Be Listed Below (If Applicable).       Signed, Sinclair Grooms, MD  12/09/2018 10:24 AM    [place]

## 2018-12-09 ENCOUNTER — Ambulatory Visit (INDEPENDENT_AMBULATORY_CARE_PROVIDER_SITE_OTHER): Payer: PPO | Admitting: Interventional Cardiology

## 2018-12-09 ENCOUNTER — Encounter: Payer: Self-pay | Admitting: Interventional Cardiology

## 2018-12-09 ENCOUNTER — Other Ambulatory Visit: Payer: Self-pay

## 2018-12-09 VITALS — BP 128/72 | HR 76 | Ht 68.0 in | Wt 260.6 lb

## 2018-12-09 DIAGNOSIS — I2510 Atherosclerotic heart disease of native coronary artery without angina pectoris: Secondary | ICD-10-CM | POA: Diagnosis not present

## 2018-12-09 DIAGNOSIS — I10 Essential (primary) hypertension: Secondary | ICD-10-CM

## 2018-12-09 DIAGNOSIS — G4733 Obstructive sleep apnea (adult) (pediatric): Secondary | ICD-10-CM

## 2018-12-09 DIAGNOSIS — Z7189 Other specified counseling: Secondary | ICD-10-CM | POA: Diagnosis not present

## 2018-12-09 DIAGNOSIS — E785 Hyperlipidemia, unspecified: Secondary | ICD-10-CM

## 2018-12-09 DIAGNOSIS — I4510 Unspecified right bundle-branch block: Secondary | ICD-10-CM | POA: Diagnosis not present

## 2018-12-09 MED ORDER — NITROGLYCERIN 0.4 MG SL SUBL: 0.4000 mg | SUBLINGUAL_TABLET | SUBLINGUAL | 5 refills | Status: DC | PRN

## 2018-12-09 MED ORDER — ATORVASTATIN CALCIUM 80 MG PO TABS: 80.0000 mg | ORAL_TABLET | Freq: Every day | ORAL | 3 refills | Status: DC

## 2018-12-09 NOTE — Patient Instructions (Signed)
Medication Instructions:  1) INCREASE Atorvastatin to 80mg  once daily  If you need a refill on your cardiac medications before your next appointment, please call your pharmacy.   Lab work: Your physician recommends that you return for lab work in: 6 weeks.  You will need to be fasting for these labs (nothing o   If you have labs (blood work) drawn today and your tests are completely normal, you will receive your results only by: Marland Kitchen MyChart Message (if you have MyChart) OR . A paper copy in the mail If you have any lab test that is abnormal or we need to change your treatment, we will call you to review the results.  Testing/Procedures: None  Follow-Up: At [facility], you and your health needs are our priority.  As part of our continuing mission to provide you with exceptional heart care, we have created designated Provider Care Teams.  These Care Teams include your primary Cardiologist (physician) and Advanced Practice Providers (APPs -  Physician Assistants and Nurse Practitioners) who all work together to provide you with the care you need, when you need it. You will need a follow up appointment in 12 months.  Please call our office 2 months in advance to schedule this appointment.  You may see Sinclair Grooms, MD or one of the following Advanced Practice Providers on your designated Care Team:   Truitt Merle, NP Cecilie Kicks, NP . Kathyrn Drown, NP  Any Other Special Instructions Will Be Listed Below (If Applicable).

## 2018-12-24 DIAGNOSIS — G4733 Obstructive sleep apnea (adult) (pediatric): Secondary | ICD-10-CM | POA: Diagnosis not present

## 2019-01-20 ENCOUNTER — Other Ambulatory Visit: Payer: Self-pay

## 2019-01-20 ENCOUNTER — Other Ambulatory Visit: Payer: PPO | Admitting: *Deleted

## 2019-01-20 DIAGNOSIS — E785 Hyperlipidemia, unspecified: Secondary | ICD-10-CM

## 2019-01-20 LAB — HEPATIC FUNCTION PANEL
ALT: 9 IU/L (ref 0–44)
AST: 18 IU/L (ref 0–40)
Albumin: 4.4 g/dL (ref 3.7–4.7)
Alkaline Phosphatase: 135 IU/L — ABNORMAL HIGH (ref 39–117)
Bilirubin Total: 0.6 mg/dL (ref 0.0–1.2)
Bilirubin, Direct: 0.18 mg/dL (ref 0.00–0.40)
Total Protein: 6.5 g/dL (ref 6.0–8.5)

## 2019-01-20 LAB — LIPID PANEL
Chol/HDL Ratio: 3.2 ratio (ref 0.0–5.0)
Cholesterol, Total: 172 mg/dL (ref 100–199)
HDL: 54 mg/dL (ref >39)
LDL Chol Calc (NIH): 104 mg/dL — ABNORMAL HIGH (ref 0–99)
Triglycerides: 71 mg/dL (ref 0–149)
VLDL Cholesterol Cal: 14 mg/dL (ref 5–40)

## 2019-01-24 DIAGNOSIS — G4733 Obstructive sleep apnea (adult) (pediatric): Secondary | ICD-10-CM | POA: Diagnosis not present

## 2019-01-27 ENCOUNTER — Other Ambulatory Visit: Payer: Self-pay | Admitting: *Deleted

## 2019-01-27 DIAGNOSIS — E785 Hyperlipidemia, unspecified: Secondary | ICD-10-CM

## 2019-01-27 MED ORDER — EZETIMIBE 10 MG PO TABS: 10.0000 mg | ORAL_TABLET | Freq: Every day | ORAL | 3 refills | Status: DC

## 2019-02-15 DIAGNOSIS — G4733 Obstructive sleep apnea (adult) (pediatric): Secondary | ICD-10-CM | POA: Diagnosis not present

## 2019-02-16 ENCOUNTER — Other Ambulatory Visit: Payer: Self-pay | Admitting: *Deleted

## 2019-02-16 NOTE — Patient Outreach (Signed)
[facility]) Care Management  02/16/2019  Richard [name] [birth year] [id number]   Case reviewed, no patient outreach needed,  and case closed per Bary Castilla, Assistant Clinical Director at [facility]  request.   Colbert Coyer. Annia Friendly, BSN, [place] Management [facility] Telephonic CM Phone: [phone] Fax: [phone]

## 2019-02-23 DIAGNOSIS — G4733 Obstructive sleep apnea (adult) (pediatric): Secondary | ICD-10-CM | POA: Diagnosis not present

## 2019-03-23 DIAGNOSIS — I2510 Atherosclerotic heart disease of native coronary artery without angina pectoris: Secondary | ICD-10-CM | POA: Diagnosis not present

## 2019-03-23 DIAGNOSIS — J441 Chronic obstructive pulmonary disease with (acute) exacerbation: Secondary | ICD-10-CM | POA: Diagnosis not present

## 2019-03-23 DIAGNOSIS — I5030 Unspecified diastolic (congestive) heart failure: Secondary | ICD-10-CM | POA: Diagnosis not present

## 2019-03-23 DIAGNOSIS — I252 Old myocardial infarction: Secondary | ICD-10-CM | POA: Diagnosis not present

## 2019-03-23 DIAGNOSIS — Z79899 Other long term (current) drug therapy: Secondary | ICD-10-CM | POA: Diagnosis not present

## 2019-03-23 DIAGNOSIS — J4540 Moderate persistent asthma, uncomplicated: Secondary | ICD-10-CM | POA: Diagnosis not present

## 2019-03-23 DIAGNOSIS — Z125 Encounter for screening for malignant neoplasm of prostate: Secondary | ICD-10-CM | POA: Diagnosis not present

## 2019-03-23 DIAGNOSIS — E7800 Pure hypercholesterolemia, unspecified: Secondary | ICD-10-CM | POA: Diagnosis not present

## 2019-03-23 DIAGNOSIS — Z139 Encounter for screening, unspecified: Secondary | ICD-10-CM | POA: Diagnosis not present

## 2019-03-23 DIAGNOSIS — Z23 Encounter for immunization: Secondary | ICD-10-CM | POA: Diagnosis not present

## 2019-03-23 DIAGNOSIS — I119 Hypertensive heart disease without heart failure: Secondary | ICD-10-CM | POA: Diagnosis not present

## 2019-03-23 DIAGNOSIS — K2100 Gastro-esophageal reflux disease with esophagitis, without bleeding: Secondary | ICD-10-CM | POA: Diagnosis not present

## 2019-04-03 DIAGNOSIS — Z79899 Other long term (current) drug therapy: Secondary | ICD-10-CM | POA: Diagnosis not present

## 2019-04-03 DIAGNOSIS — I2510 Atherosclerotic heart disease of native coronary artery without angina pectoris: Secondary | ICD-10-CM | POA: Diagnosis not present

## 2019-04-03 DIAGNOSIS — K2100 Gastro-esophageal reflux disease with esophagitis, without bleeding: Secondary | ICD-10-CM | POA: Diagnosis not present

## 2019-04-03 DIAGNOSIS — I119 Hypertensive heart disease without heart failure: Secondary | ICD-10-CM | POA: Diagnosis not present

## 2019-04-03 DIAGNOSIS — J4540 Moderate persistent asthma, uncomplicated: Secondary | ICD-10-CM | POA: Diagnosis not present

## 2019-04-03 DIAGNOSIS — J441 Chronic obstructive pulmonary disease with (acute) exacerbation: Secondary | ICD-10-CM | POA: Diagnosis not present

## 2019-04-03 DIAGNOSIS — Z139 Encounter for screening, unspecified: Secondary | ICD-10-CM | POA: Diagnosis not present

## 2019-04-03 DIAGNOSIS — I5030 Unspecified diastolic (congestive) heart failure: Secondary | ICD-10-CM | POA: Diagnosis not present

## 2019-04-03 DIAGNOSIS — I252 Old myocardial infarction: Secondary | ICD-10-CM | POA: Diagnosis not present

## 2019-04-03 DIAGNOSIS — E7800 Pure hypercholesterolemia, unspecified: Secondary | ICD-10-CM | POA: Diagnosis not present

## 2019-04-03 DIAGNOSIS — Z23 Encounter for immunization: Secondary | ICD-10-CM | POA: Diagnosis not present

## 2019-04-03 DIAGNOSIS — Z125 Encounter for screening for malignant neoplasm of prostate: Secondary | ICD-10-CM | POA: Diagnosis not present

## 2019-04-18 ENCOUNTER — Other Ambulatory Visit (HOSPITAL_COMMUNITY): Payer: Self-pay | Admitting: Pulmonary Disease

## 2019-04-21 ENCOUNTER — Telehealth: Payer: Self-pay | Admitting: *Deleted

## 2019-04-21 NOTE — Telephone Encounter (Signed)

## 2019-04-24 NOTE — Progress Notes (Signed)
Virtual Visit via Telephone Note   This visit type was conducted due to national recommendations for restrictions regarding the COVID-19 Pandemic (e.g. social distancing) in an effort to limit this patient's exposure and mitigate transmission in our community.  Due to his co-morbid illnesses, this patient is at least at moderate risk for complications without adequate follow up.  This format is felt to be most appropriate for this patient at this time.  All issues noted in this document were discussed and addressed.  A limited physical exam was performed with this format.  Please refer to the patient's chart for his consent to telehealth for [facility].   Evaluation Performed:  Follow-up visit  This visit type was conducted due to national recommendations for restrictions regarding the COVID-19 Pandemic (e.g. social distancing).  This format is felt to be most appropriate for this patient at this time.  All issues noted in this document were discussed and addressed.  No physical exam was performed (except for noted visual exam findings with Video Visits).  Please refer to the patient's chart (MyChart message for video visits and phone note for telephone visits) for the patient's consent to telehealth for [facility].  Date:  04/25/2019   ID:  Richard [name], DOB September 29, 1945, MRN [id number]  Patient Location:  Home  Provider location:   [place]  PCP:  Sueanne Margarita, MD  Cardiologist:  Sinclair Grooms, MD  Sleep Medicine:  Fransico Him, MD Electrophysiologist:  None   Chief Complaint:  OSA  History of Present Illness:    Richard [name] is a 74 y.o. male who presents via audio/video conferencing for a telehealth visit today.    Richard [name] is a 74 y.o. male with a hx of Obesity, snoring and witnessed Apnea.  He was referred for sleep study which showed mild OSA with an AHI of 13.5/hr and O2 sats of 88% with events.  He underwent BiPAP titration to 13/9cm H2O.     He is doing well with his CPAP device and thinks that he has gotten used to it.  He tolerates the mask and feels the pressure is adequate.  He does not really notice a difference in how he feels in the am because he gets up several times at night to urinate due to diuretics.   He denies any significant mouth or nasal dryness or nasal congestion.  He does not think that he snores.    The patient does not have symptoms concerning for COVID-19 infection (fever, chills, cough, or new shortness of breath).   Prior CV studies:   The following studies were reviewed today:  PAP compliance download  Past Medical History:  Diagnosis Date  . Arthritis   . Asthma   . High cholesterol   . Hypertension   . Myocardial infarction [facility]) 2004   Dr. Tamala Julian, at [place], yearly visits  . OSA treated with BiPAP 04/24/2018   Mild OSA with an AHI of 13.5/hr and O2 sats of 88%. Now on BiPAP at 13/9cm H2O.    Past Surgical History:  Procedure Laterality Date  . CARDIAC CATHETERIZATION  2004   with 2 stents  . CARDIOVASCULAR STRESS TEST  2011  . COLONOSCOPY    . POLYPECTOMY  02/17/2011   Procedure: POLYPECTOMY NASAL;  Surgeon: Delsa Bern;  Location: [facility] OR;  Service: ENT;  Laterality: N/A;  . SINUS ENDO W/FUSION  02/17/2011   Procedure: ENDOSCOPIC SINUS SURGERY WITH FUSION NAVIGATION;  Surgeon: Shanon Brow  Gilford Raid;  Location: [facility] OR;  Service: ENT;  Laterality: N/A;  . TONSILLECTOMY    . US ECHOCARDIOGRAPHY       Current Meds  Medication Sig  . albuterol (PROVENTIL HFA;VENTOLIN HFA) 108 (90 Base) MCG/ACT inhaler Inhale into the lungs every 6 (six) hours as needed for wheezing or shortness of breath.  Marland Kitchen amLODipine (NORVASC) 5 MG tablet Take 5 mg by mouth daily.  Marland Kitchen aspirin EC 81 MG tablet Take 1 tablet (81 mg total) by mouth daily.  Marland Kitchen atorvastatin (LIPITOR) 80 MG tablet Take 1 tablet (80 mg total) by mouth daily.  . carvedilol (COREG) 12.5 MG tablet Take 12.5 mg by mouth 2 (two) times daily.    .  chlorthalidone (HYGROTON) 50 MG tablet Take 50 mg by mouth daily.  Marland Kitchen ezetimibe (ZETIA) 10 MG tablet Take 1 tablet (10 mg total) by mouth daily.  . fluticasone furoate-vilanterol (BREO ELLIPTA) 200-25 MCG/INH AEPB Inhale 1 puff into the lungs daily.  Marland Kitchen ipratropium-albuterol (DUONEB) 0.5-2.5 (3) MG/3ML SOLN Take 3 mLs by nebulization 3 (three) times daily. When feeling better change instructions to take every 6 hours if needed for wheezing or shortness of breath  . montelukast (SINGULAIR) 10 MG tablet Take 10 mg by mouth at bedtime.  . nitroGLYCERIN (NITROSTAT) 0.4 MG SL tablet Place 1 tablet (0.4 mg total) under the tongue every 5 (five) minutes as needed. For chest pain  . omeprazole (PRILOSEC) 40 MG capsule Take 40 mg by mouth daily.     Allergies:   Beef-derived products, Chicken protein, Lisinopril, and Peanut-containing drug products   Social History   Tobacco Use  . Smoking status: Former Research scientist (life sciences)  . Smokeless tobacco: Never Used  Substance Use Topics  . Alcohol use: Yes    Alcohol/week: 6.0 standard drinks    Types: 2 Cans of beer, 4 Standard drinks or equivalent per week    Comment: 5-6 drinks/week  . Drug use: No     Family Hx: The patient's family history includes Alzheimer's disease in his mother; Cancer in his mother; Diabetes in his father and sister; Heart attack in his father; Hypertension in his sister.  ROS:   Please see the history of present illness.     All other systems reviewed and are negative.   Labs/Other Tests and Data Reviewed:    Recent Labs: 01/20/2019: ALT 9   Recent Lipid Panel Lab Results  Component Value Date/Time   CHOL 172 01/20/2019 10:22 AM   TRIG 71 01/20/2019 10:22 AM   HDL 54 01/20/2019 10:22 AM   CHOLHDL 3.2 01/20/2019 10:22 AM   LDLCALC 104 (H) 01/20/2019 10:22 AM    Wt Readings from Last 3 Encounters:  04/25/19 252 lb (114.3 kg)  12/09/18 260 lb 9.6 oz (118.2 kg)  04/25/18 257 lb 3.2 oz (116.7 kg)     Objective:    Vital  Signs:  Ht 5\' 8"  (1.727 m)   Wt 252 lb (114.3 kg)   BMI 38.32 kg/m    ASSESSMENT & PLAN:    1.  OSA - The patient is tolerating PAP therapy well without any problems. The PAP download was reviewed today and showed an AHI of 0.3/hr on 15/9 cm H2O with 57% compliance in using more than 4 hours nightly.  The patient has been using and benefiting from PAP use and will continue to benefit from therapy. His compliance is low due to recent URI for the past few weeks and is going to see his PCP  today.   2.  HTN -BP controlled -continue amlodipine 5mg  daily, Carvedilol 12.5mg  BID and Chlorthalidone 50mg  daily  3.  Obesity -I have encouraged him to get into a routine exercise program and cut back on carbs and portions.    COVID-19 Education: The signs and symptoms of COVID-19 were discussed with the patient and how to seek care for testing (follow up with PCP or arrange E-visit).  The importance of social distancing was discussed today.  Patient Risk:   After full review of this patient's clinical status, I feel that they are at least moderate risk at this time.  Time:   Today, I have spent 20 minutes on telemedicine discussing medical problems including OSA< HTN and Obesity and reviewing patient's chart including outside labs on [facility], PAP compliance download from [place].  Medication Adjustments/Labs and Tests Ordered: Current medicines are reviewed at length with the patient today.  Concerns regarding medicines are outlined above.  Tests Ordered: No orders of the defined types were placed in this encounter.  Medication Changes: No orders of the defined types were placed in this encounter.   Disposition:  Follow up in 1 year(s)  Signed, Fransico Him, MD  04/25/2019 10:25 AM    [facility]

## 2019-04-25 ENCOUNTER — Telehealth (INDEPENDENT_AMBULATORY_CARE_PROVIDER_SITE_OTHER): Payer: PPO | Admitting: Cardiology

## 2019-04-25 ENCOUNTER — Encounter: Payer: Self-pay | Admitting: Cardiology

## 2019-04-25 ENCOUNTER — Other Ambulatory Visit: Payer: Self-pay

## 2019-04-25 ENCOUNTER — Ambulatory Visit
Admission: EM | Admit: 2019-04-25 | Discharge: 2019-04-25 | Disposition: A | Discharge status: Home / Self Care | Payer: PPO | Attending: Physician Assistant | Admitting: Physician Assistant

## 2019-04-25 VITALS — Ht 68.0 in | Wt 252.0 lb

## 2019-04-25 DIAGNOSIS — Z20822 Contact with and (suspected) exposure to covid-19: Secondary | ICD-10-CM

## 2019-04-25 DIAGNOSIS — J209 Acute bronchitis, unspecified: Secondary | ICD-10-CM

## 2019-04-25 DIAGNOSIS — I10 Essential (primary) hypertension: Secondary | ICD-10-CM

## 2019-04-25 DIAGNOSIS — G4733 Obstructive sleep apnea (adult) (pediatric): Secondary | ICD-10-CM

## 2019-04-25 DIAGNOSIS — E669 Obesity, unspecified: Secondary | ICD-10-CM

## 2019-04-25 DIAGNOSIS — R05 Cough: Secondary | ICD-10-CM | POA: Diagnosis not present

## 2019-04-25 DIAGNOSIS — R059 Cough, unspecified: Secondary | ICD-10-CM

## 2019-04-25 MED ORDER — DOXYCYCLINE HYCLATE 100 MG PO CAPS: 100.0000 mg | ORAL_CAPSULE | Freq: Two times a day (BID) | ORAL | 0 refills | Status: DC

## 2019-04-25 MED ORDER — IPRATROPIUM BROMIDE 0.06 % NA SOLN: 2.0000 | Freq: Four times a day (QID) | NASAL | 0 refills | Status: DC

## 2019-04-25 MED ORDER — PREDNISONE 50 MG PO TABS: 50.0000 mg | ORAL_TABLET | Freq: Every day | ORAL | 0 refills | Status: DC

## 2019-04-25 NOTE — ED Provider Notes (Signed)
[facility]    CSN: [id number] Arrival date & time: 04/25/19  1126      History   Chief Complaint Chief Complaint  Patient presents with  . Cough    HPI Richard [name] is a 74 y.o. male.   74 year old male comes in for 10 day history of URI symptoms. Symptoms not improving, but no obvious worsening. Productive cough with nasal congestion, body aches. Has dyspnea on exertion, though still able to finish activity at home. Denies fever, chills. Denies abdominal pain, nausea, vomiting.  Denies loss of taste/smell. Former smoker.      Past Medical History:  Diagnosis Date  . Arthritis   . Asthma   . High cholesterol   . Hypertension   . Myocardial infarction [facility]) 2004   Dr. Tamala Julian, at [place], yearly visits  . OSA treated with BiPAP 04/24/2018   Mild OSA with an AHI of 13.5/hr and O2 sats of 88%. Now on BiPAP at 13/9cm H2O.     Patient Active Problem List   Diagnosis Date Noted  . Obesity (BMI 30-39.9) 04/24/2018  . OSA treated with BiPAP 04/24/2018  . Dyspnea on exertion 09/13/2017  . Right bundle branch block 06/27/2014  . Hyperlipidemia 06/26/2013  . Influenza due to identified novel influenza A virus with other respiratory manifestations 05/01/2013  . CAP (community acquired pneumonia) 04/30/2013  . CAD in native artery 04/30/2013  . Asthma 04/30/2013  . Hypoxia 04/30/2013  . Hypertension   . 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 CARDIAC CATHETERIZATION  2004   with 2 stents  . CARDIOVASCULAR STRESS TEST  2011  . COLONOSCOPY    . POLYPECTOMY  02/17/2011   Procedure: POLYPECTOMY NASAL;  Surgeon: Delsa Bern;  Location: [facility] OR;  Service: ENT;  Laterality: N/A;  . SINUS ENDO W/FUSION  02/17/2011   Procedure: ENDOSCOPIC SINUS SURGERY WITH FUSION NAVIGATION;  Surgeon: Delsa Bern;  Location: [facility] OR;  Service: ENT;  Laterality: N/A;  . TONSILLECTOMY    . US  ECHOCARDIOGRAPHY         Home Medications    Prior to Admission medications   Medication Sig Start Date End Date Taking? Authorizing Provider  albuterol (PROVENTIL HFA;VENTOLIN HFA) 108 (90 Base) MCG/ACT inhaler Inhale into the lungs every 6 (six) hours as needed for wheezing or shortness of breath.    [provider]  amLODipine (NORVASC) 5 MG tablet Take 5 mg by mouth daily. 09/23/16   Vincente Liberty, MD  aspirin EC 81 MG tablet Take 1 tablet (81 mg total) by mouth daily. 04/22/17   Daune Perch, NP  atorvastatin (LIPITOR) 80 MG tablet Take 1 tablet (80 mg total) by mouth daily. 12/09/18 04/25/19  Belva Crome, MD  carvedilol (COREG) 12.5 MG tablet Take 12.5 mg by mouth 2 (two) times daily.      [provider]  chlorthalidone (HYGROTON) 50 MG tablet Take 50 mg by mouth daily. 04/19/17   [provider]  doxycycline (VIBRAMYCIN) 100 MG capsule Take 1 capsule (100 mg total) by mouth 2 (two) times daily. 04/25/19   Tasia Catchings, Kaesyn Johnston V, PA-C  ezetimibe (ZETIA) 10 MG tablet Take 1 tablet (10 mg total) by mouth daily. 01/27/19   Belva Crome, MD  fluticasone furoate-vilanterol (BREO ELLIPTA) 200-25 MCG/INH AEPB Inhale 1 puff into the lungs daily.    [provider]  ipratropium (ATROVENT) 0.06 %  nasal spray Place 2 sprays into both nostrils 4 (four) times daily. 04/25/19   Tasia Catchings, Zeriyah Wain V, PA-C  ipratropium-albuterol (DUONEB) 0.5-2.5 (3) MG/3ML SOLN Take 3 mLs by nebulization 3 (three) times daily. When feeling better change instructions to take every 6 hours if needed for wheezing or shortness of breath 05/03/13   Dellinger, Haynes Dage L, PA-C  montelukast (SINGULAIR) 10 MG tablet Take 10 mg by mouth at bedtime.    [provider]  nitroGLYCERIN (NITROSTAT) 0.4 MG SL tablet Place 1 tablet (0.4 mg total) under the tongue every 5 (five) minutes as needed. For chest pain 12/09/18   Belva Crome, MD  omeprazole (PRILOSEC) 40 MG capsule Take 40 mg by mouth daily.     [provider]  predniSONE (DELTASONE) 50 MG tablet Take 1 tablet (50 mg total) by mouth daily with breakfast. 04/25/19   Ok Edwards, PA-C    Family History Family History  Problem Relation Age of Onset  . Heart attack Father   . Diabetes Father   . Cancer Mother   . Alzheimer's disease Mother   . Hypertension Sister   . Diabetes Sister     Social History Social History   Tobacco Use  . Smoking status: Former Research scientist (life sciences)  . Smokeless tobacco: Never Used  Substance Use Topics  . Alcohol use: Yes    Alcohol/week: 6.0 standard drinks    Types: 2 Cans of beer, 4 Standard drinks or equivalent per week    Comment: 5-6 drinks/week  . Drug use: No     Allergies   Beef-derived products, Chicken protein, Lisinopril, and Peanut-containing drug products   Review of Systems Review of Systems  Reason unable to perform ROS: See HPI as above.     Physical Exam Triage Vital Signs ED Triage Vitals  Enc Vitals Group     BP 04/25/19 1229 (!) 164/89     Pulse Rate 04/25/19 1229 71     Resp 04/25/19 1229 20     Temp 04/25/19 1229 98.6 F (37 C)     Temp Source 04/25/19 1229 Oral     SpO2 04/25/19 1229 97 %     Weight --      Height --      Head Circumference --      Peak Flow --      Pain Score 04/25/19 1230 0     Pain Loc --      Pain Edu? --      Excl. in [place]? --    No data found.  Updated Vital Signs BP (!) 164/89 (BP Location: Left Arm)   Pulse 71   Temp 98.6 F (37 C) (Oral)   Resp 20   SpO2 97%   Visual Acuity Right Eye Distance:   Left Eye Distance:   Bilateral Distance:    Right Eye Near:   Left Eye Near:    Bilateral Near:     Physical Exam Constitutional:      General: He is not in acute distress.    Appearance: Normal appearance. He is not ill-appearing, toxic-appearing or diaphoretic.  HENT:     Head: Normocephalic and atraumatic.     Mouth/Throat:     Mouth: Mucous membranes are moist.     Pharynx: Oropharynx is clear. Uvula midline.   Cardiovascular:     Rate and Rhythm: Normal rate and regular rhythm.     Heart sounds: Normal heart sounds. No murmur. No friction rub. No gallop.  Pulmonary:     Effort: Pulmonary effort is normal. No accessory muscle usage, prolonged expiration, respiratory distress or retractions.     Comments: Lungs clear to auscultation without adventitious lung sounds. Musculoskeletal:     Cervical back: Normal range of motion and neck supple.  Neurological:     General: No focal deficit present.     Mental Status: He is alert and oriented to person, place, and time.      UC Treatments / Results  Labs (all labs ordered are listed, but only abnormal results are displayed) Labs Reviewed  NOVEL CORONAVIRUS, NAA    EKG   Radiology No results found.  Procedures Procedures (including critical care time)  Medications Ordered in UC Medications - No data to display  Initial Impression / Assessment and Plan / UC Course  I have reviewed the triage vital signs and the nursing notes.  Pertinent labs & imaging results that were available during my care of the patient were reviewed by me and considered in my medical decision making (see chart for details).    COVID PCR test ordered. Patient to quarantine until testing results return. No alarming signs on exam.  Patient speaking in full sentences without respiratory distress. Will treat for bronchitis with possibility of COPD given former smoker. Doxycycline and prednisone as directed. continue inhaler use. Other symptomatic treatment discussed.  Push fluids.  Return precautions given.  Patient expresses understanding and agrees to plan.  Final Clinical Impressions(s) / UC Diagnoses   Final diagnoses:  Cough  Acute bronchitis, unspecified organism   ED Prescriptions    Medication Sig Dispense Auth. Provider   predniSONE (DELTASONE) 50 MG tablet Take 1 tablet (50 mg total) by mouth daily with breakfast. 5 tablet Tymon Nemetz V, PA-C   doxycycline  (VIBRAMYCIN) 100 MG capsule Take 1 capsule (100 mg total) by mouth 2 (two) times daily. 20 capsule Lucile Didonato V, PA-C   ipratropium (ATROVENT) 0.06 % nasal spray Place 2 sprays into both nostrils 4 (four) times daily. 15 mL Ok Edwards, PA-C     PDMP not reviewed this encounter.   Ok Edwards, PA-C 04/25/19 1736

## 2019-04-25 NOTE — ED Triage Notes (Signed)
Pt c/o cough, nasal congestion, body aches and SOB x10 days

## 2019-04-25 NOTE — Addendum Note (Signed)
Addended by: Fransico Him R on: 04/25/2019 01:19 PM   Modules accepted: Level of Service

## 2019-04-25 NOTE — Discharge Instructions (Signed)
COVID PCR testing ordered. I would like you to quarantine until testing results. Start prednisone and doxycycline. Continue albuterol. Atrovent or over the counter flonase/nasacort to help with nasal congestion/drainage. Tylenol/motrin for pain and fever. Keep hydrated, urine should be clear to pale yellow in color. If experiencing shortness of breath, trouble breathing, go to the emergency department for further evaluation needed.

## 2019-04-25 NOTE — Patient Instructions (Signed)

## 2019-04-26 LAB — NOVEL CORONAVIRUS, NAA: SARS-CoV-2, NAA: NOT DETECTED

## 2019-04-30 ENCOUNTER — Ambulatory Visit: Payer: PPO | Attending: Internal Medicine

## 2019-04-30 DIAGNOSIS — Z23 Encounter for immunization: Secondary | ICD-10-CM

## 2019-04-30 NOTE — Progress Notes (Signed)
   Covid-19 Vaccination Clinic  Name:  Richard [name]    MRN: [id number] DOB: 07-12-1945  04/30/2019  Mr. [name] was observed post Covid-19 immunization for 15 minutes without incidence. He was provided with Vaccine Information Sheet and instruction to access the V-Safe system.   Mr. [name] was instructed to call 911 with any severe reactions post vaccine: Marland Kitchen Difficulty breathing  . Swelling of your face and throat  . A fast heartbeat  . A bad rash all over your body  . Dizziness and weakness    Immunizations Administered    Name Date Dose VIS Date Route   Pfizer COVID-19 Vaccine 04/30/2019 12:42 PM 0.3 mL 02/17/2019 Intramuscular   Manufacturer: [place]   Lot: [id number]   [place]: [id number]

## 2019-05-08 DIAGNOSIS — E7800 Pure hypercholesterolemia, unspecified: Secondary | ICD-10-CM | POA: Diagnosis not present

## 2019-05-08 DIAGNOSIS — J4520 Mild intermittent asthma, uncomplicated: Secondary | ICD-10-CM | POA: Diagnosis not present

## 2019-05-08 DIAGNOSIS — K2100 Gastro-esophageal reflux disease with esophagitis, without bleeding: Secondary | ICD-10-CM | POA: Diagnosis not present

## 2019-05-08 DIAGNOSIS — I119 Hypertensive heart disease without heart failure: Secondary | ICD-10-CM | POA: Diagnosis not present

## 2019-05-08 DIAGNOSIS — J441 Chronic obstructive pulmonary disease with (acute) exacerbation: Secondary | ICD-10-CM | POA: Diagnosis not present

## 2019-05-08 DIAGNOSIS — I2510 Atherosclerotic heart disease of native coronary artery without angina pectoris: Secondary | ICD-10-CM | POA: Diagnosis not present

## 2019-05-08 DIAGNOSIS — I252 Old myocardial infarction: Secondary | ICD-10-CM | POA: Diagnosis not present

## 2019-05-08 DIAGNOSIS — Z79899 Other long term (current) drug therapy: Secondary | ICD-10-CM | POA: Diagnosis not present

## 2019-05-08 DIAGNOSIS — E669 Obesity, unspecified: Secondary | ICD-10-CM | POA: Diagnosis not present

## 2019-05-08 DIAGNOSIS — I5030 Unspecified diastolic (congestive) heart failure: Secondary | ICD-10-CM | POA: Diagnosis not present

## 2019-05-08 DIAGNOSIS — R0609 Other forms of dyspnea: Secondary | ICD-10-CM | POA: Diagnosis not present

## 2019-05-24 ENCOUNTER — Ambulatory Visit: Payer: PPO | Attending: Internal Medicine

## 2019-05-24 DIAGNOSIS — Z23 Encounter for immunization: Secondary | ICD-10-CM

## 2019-05-24 NOTE — Progress Notes (Signed)
   Covid-19 Vaccination Clinic  Name:  FENIX WILCKEN    MRN: [id number] DOB: 03-Nov-1945  05/24/2019  Mr. Lazo was observed post Covid-19 immunization for 15 minutes without incident. He was provided with Vaccine Information Sheet and instruction to access the V-Safe system.   Mr. Nugen was instructed to call 911 with any severe reactions post vaccine: Marland Kitchen Difficulty breathing  . Swelling of face and throat  . A fast heartbeat  . A bad rash all over body  . Dizziness and weakness   Immunizations Administered    Name Date Dose VIS Date Route   Pfizer COVID-19 Vaccine 05/24/2019  9:13 AM 0.3 mL 02/17/2019 Intramuscular   Manufacturer: [place]   Lot: [id number]   [place]: [id number]

## 2019-06-02 DIAGNOSIS — G4733 Obstructive sleep apnea (adult) (pediatric): Secondary | ICD-10-CM | POA: Diagnosis not present

## 2019-06-22 DIAGNOSIS — J449 Chronic obstructive pulmonary disease, unspecified: Secondary | ICD-10-CM | POA: Diagnosis not present

## 2019-07-13 ENCOUNTER — Other Ambulatory Visit (HOSPITAL_COMMUNITY): Payer: Self-pay | Admitting: Pulmonary Disease

## 2019-07-20 ENCOUNTER — Telehealth: Payer: Self-pay | Admitting: *Deleted

## 2019-07-20 NOTE — Telephone Encounter (Signed)
-----   Message from Loren Racer, RN sent at 01/27/2019  4:33 PM EST ----- Regarding: Lipid panel late May 2021 Needs Lipid panel in late May 2021

## 2019-07-20 NOTE — Telephone Encounter (Signed)
Pt returnied Richard [name]'s call and set up a lab appt

## 2019-07-20 NOTE — Telephone Encounter (Signed)
Called and left message for pt to call back and schedule Lipid panel.  Advised on message to come fasting for labs.

## 2019-07-25 DIAGNOSIS — I5030 Unspecified diastolic (congestive) heart failure: Secondary | ICD-10-CM | POA: Diagnosis not present

## 2019-07-25 DIAGNOSIS — E669 Obesity, unspecified: Secondary | ICD-10-CM | POA: Diagnosis not present

## 2019-07-25 DIAGNOSIS — Z79899 Other long term (current) drug therapy: Secondary | ICD-10-CM | POA: Diagnosis not present

## 2019-07-25 DIAGNOSIS — I252 Old myocardial infarction: Secondary | ICD-10-CM | POA: Diagnosis not present

## 2019-07-25 DIAGNOSIS — M2550 Pain in unspecified joint: Secondary | ICD-10-CM | POA: Diagnosis not present

## 2019-07-25 DIAGNOSIS — I2510 Atherosclerotic heart disease of native coronary artery without angina pectoris: Secondary | ICD-10-CM | POA: Diagnosis not present

## 2019-07-25 DIAGNOSIS — J441 Chronic obstructive pulmonary disease with (acute) exacerbation: Secondary | ICD-10-CM | POA: Diagnosis not present

## 2019-07-25 DIAGNOSIS — K2100 Gastro-esophageal reflux disease with esophagitis, without bleeding: Secondary | ICD-10-CM | POA: Diagnosis not present

## 2019-07-25 DIAGNOSIS — I119 Hypertensive heart disease without heart failure: Secondary | ICD-10-CM | POA: Diagnosis not present

## 2019-07-25 DIAGNOSIS — E7800 Pure hypercholesterolemia, unspecified: Secondary | ICD-10-CM | POA: Diagnosis not present

## 2019-07-25 DIAGNOSIS — J4540 Moderate persistent asthma, uncomplicated: Secondary | ICD-10-CM | POA: Diagnosis not present

## 2019-07-25 DIAGNOSIS — R0600 Dyspnea, unspecified: Secondary | ICD-10-CM | POA: Diagnosis not present

## 2019-07-26 ENCOUNTER — Other Ambulatory Visit: Payer: Self-pay

## 2019-07-26 ENCOUNTER — Other Ambulatory Visit: Payer: PPO | Admitting: *Deleted

## 2019-07-26 DIAGNOSIS — E785 Hyperlipidemia, unspecified: Secondary | ICD-10-CM

## 2019-07-27 LAB — LIPID PANEL
Chol/HDL Ratio: 2.8 ratio (ref 0.0–5.0)
Cholesterol, Total: 167 mg/dL (ref 100–199)
HDL: 60 mg/dL (ref >39)
LDL Chol Calc (NIH): 89 mg/dL (ref 0–99)
Triglycerides: 99 mg/dL (ref 0–149)
VLDL Cholesterol Cal: 18 mg/dL (ref 5–40)

## 2019-09-07 DIAGNOSIS — G4733 Obstructive sleep apnea (adult) (pediatric): Secondary | ICD-10-CM | POA: Diagnosis not present

## 2019-09-21 ENCOUNTER — Other Ambulatory Visit (HOSPITAL_COMMUNITY): Payer: Self-pay | Admitting: Pulmonary Disease

## 2019-10-23 ENCOUNTER — Other Ambulatory Visit (HOSPITAL_COMMUNITY): Payer: Self-pay | Admitting: Pulmonary Disease

## 2019-11-28 ENCOUNTER — Other Ambulatory Visit (HOSPITAL_COMMUNITY): Payer: Self-pay | Admitting: Pulmonary Disease

## 2019-11-28 DIAGNOSIS — I2510 Atherosclerotic heart disease of native coronary artery without angina pectoris: Secondary | ICD-10-CM | POA: Diagnosis not present

## 2019-11-28 DIAGNOSIS — Z79899 Other long term (current) drug therapy: Secondary | ICD-10-CM | POA: Diagnosis not present

## 2019-11-28 DIAGNOSIS — I25119 Atherosclerotic heart disease of native coronary artery with unspecified angina pectoris: Secondary | ICD-10-CM

## 2019-11-28 DIAGNOSIS — R069 Unspecified abnormalities of breathing: Secondary | ICD-10-CM | POA: Diagnosis not present

## 2019-11-28 DIAGNOSIS — M899 Disorder of bone, unspecified: Secondary | ICD-10-CM | POA: Diagnosis not present

## 2019-11-28 DIAGNOSIS — J441 Chronic obstructive pulmonary disease with (acute) exacerbation: Secondary | ICD-10-CM | POA: Diagnosis not present

## 2019-11-28 DIAGNOSIS — J449 Chronic obstructive pulmonary disease, unspecified: Secondary | ICD-10-CM

## 2019-11-28 DIAGNOSIS — E669 Obesity, unspecified: Secondary | ICD-10-CM | POA: Diagnosis not present

## 2019-11-28 DIAGNOSIS — E069 Thyroiditis, unspecified: Secondary | ICD-10-CM | POA: Diagnosis not present

## 2019-11-28 DIAGNOSIS — E7800 Pure hypercholesterolemia, unspecified: Secondary | ICD-10-CM | POA: Diagnosis not present

## 2019-11-28 DIAGNOSIS — I119 Hypertensive heart disease without heart failure: Secondary | ICD-10-CM | POA: Diagnosis not present

## 2019-11-28 DIAGNOSIS — I5030 Unspecified diastolic (congestive) heart failure: Secondary | ICD-10-CM | POA: Diagnosis not present

## 2019-11-28 DIAGNOSIS — J4540 Moderate persistent asthma, uncomplicated: Secondary | ICD-10-CM | POA: Diagnosis not present

## 2019-11-28 DIAGNOSIS — I252 Old myocardial infarction: Secondary | ICD-10-CM | POA: Diagnosis not present

## 2019-12-04 ENCOUNTER — Other Ambulatory Visit (HOSPITAL_COMMUNITY): Payer: Self-pay | Admitting: Gastroenterology

## 2019-12-04 DIAGNOSIS — Z86010 Personal history of colonic polyps: Secondary | ICD-10-CM | POA: Diagnosis not present

## 2019-12-05 ENCOUNTER — Ambulatory Visit (HOSPITAL_COMMUNITY)
Admission: RE | Admit: 2019-12-05 | Discharge: 2019-12-05 | Disposition: A | Discharge status: Home / Self Care | Payer: PPO | Source: Ambulatory Visit | Attending: Pulmonary Disease | Admitting: Pulmonary Disease

## 2019-12-05 ENCOUNTER — Other Ambulatory Visit: Payer: Self-pay

## 2019-12-05 DIAGNOSIS — J449 Chronic obstructive pulmonary disease, unspecified: Secondary | ICD-10-CM

## 2019-12-05 DIAGNOSIS — R911 Solitary pulmonary nodule: Secondary | ICD-10-CM | POA: Diagnosis not present

## 2019-12-05 DIAGNOSIS — R0602 Shortness of breath: Secondary | ICD-10-CM | POA: Diagnosis not present

## 2019-12-05 DIAGNOSIS — I25119 Atherosclerotic heart disease of native coronary artery with unspecified angina pectoris: Secondary | ICD-10-CM | POA: Diagnosis not present

## 2019-12-05 DIAGNOSIS — J9811 Atelectasis: Secondary | ICD-10-CM | POA: Diagnosis not present

## 2019-12-07 ENCOUNTER — Other Ambulatory Visit (HOSPITAL_COMMUNITY): Payer: Self-pay | Admitting: Pulmonary Disease

## 2019-12-12 NOTE — Progress Notes (Signed)
Cardiology Office Note:    Date:  12/13/2019   ID:  Richard [name], DOB 08-11-1945, MRN [id number]  PCP:  Vincente Liberty, MD  Cardiologist:  Sinclair Grooms, MD   Referring MD: Sueanne Margarita, MD   Chief Complaint  Patient presents with  . Coronary Artery Disease    History of Present Illness:    Richard [name] is a 74 y.o. 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  Richard [name] continues to have difficulty with breathing.  Things such as bending over to tie his shoes causes significant shortness of breath.  He is not having chest pain.  Shortness of breath has been progressive over the last several years.  He recently saw Dr. Radford Pax and is trying to increase his compliance with CPAP.  He denies palpitations.  He has not had syncope.  He does have peripheral edema.  Is a 19 and extensive cardiac evaluation was done for similar complaints.  Echo demonstrated normal LV systolic function.  A myocardial perfusion study was done and was low risk.  He now mentions that he is interested in losing weight and wants to be referred to a weight loss program.  Past Medical History:  Diagnosis Date  . Arthritis   . Asthma   . High cholesterol   . Hypertension   . Myocardial infarction [facility]) 2004   Dr. Tamala Julian, at [place], yearly visits  . OSA treated with BiPAP 04/24/2018   Mild OSA with an AHI of 13.5/hr and O2 sats of 88%. Now on BiPAP at 13/9cm H2O.     Past Surgical History:  Procedure Laterality Date  . CARDIAC CATHETERIZATION  2004   with 2 stents  . CARDIOVASCULAR STRESS TEST  2011  . COLONOSCOPY    . POLYPECTOMY  02/17/2011   Procedure: POLYPECTOMY NASAL;  Surgeon: Delsa Bern;  Location: [place] OR;  Service: ENT;  Laterality: N/A;  . SINUS ENDO W/FUSION  02/17/2011   Procedure: ENDOSCOPIC SINUS SURGERY WITH FUSION NAVIGATION;  Surgeon: Delsa Bern;  Location: [facility]  OR;  Service: ENT;  Laterality: N/A;  . TONSILLECTOMY    . US ECHOCARDIOGRAPHY      Current Medications: Current Meds  Medication Sig  . albuterol (PROVENTIL HFA;VENTOLIN HFA) 108 (90 Base) MCG/ACT inhaler Inhale into the lungs every 6 (six) hours as needed for wheezing or shortness of breath.  Marland Kitchen amLODipine (NORVASC) 5 MG tablet Take 5 mg by mouth daily.  Marland Kitchen aspirin EC 81 MG tablet Take 1 tablet (81 mg total) by mouth daily.  Marland Kitchen atorvastatin (LIPITOR) 80 MG tablet Take 1 tablet (80 mg total) by mouth daily.  . carvedilol (COREG) 12.5 MG tablet Take 1 tablet (12.5 mg total) by mouth 2 (two) times daily.  . chlorthalidone (HYGROTON) 50 MG tablet Take 50 mg by mouth daily.  Marland Kitchen doxycycline (VIBRAMYCIN) 100 MG capsule Take 1 capsule (100 mg total) by mouth 2 (two) times daily.  Marland Kitchen ezetimibe (ZETIA) 10 MG tablet Take 1 tablet (10 mg total) by mouth daily.  . fluticasone furoate-vilanterol (BREO ELLIPTA) 200-25 MCG/INH AEPB Inhale 1 puff into the lungs daily.  Marland Kitchen ipratropium (ATROVENT) 0.06 % nasal spray Place 2 sprays into both nostrils 4 (four) times daily.  Marland Kitchen ipratropium-albuterol (DUONEB) 0.5-2.5 (3) MG/3ML SOLN Take 3 mLs by nebulization 3 (three) times daily. When feeling better change instructions to take every 6  hours if needed for wheezing or shortness of breath  . montelukast (SINGULAIR) 10 MG tablet Take 10 mg by mouth at bedtime.  . nitroGLYCERIN (NITROSTAT) 0.4 MG SL tablet Place 1 tablet (0.4 mg total) under the tongue every 5 (five) minutes as needed. For chest pain  . omeprazole (PRILOSEC) 40 MG capsule Take 40 mg by mouth daily.  . predniSONE (DELTASONE) 50 MG tablet Take 1 tablet (50 mg total) by mouth daily with breakfast.  . Vitamin D, Ergocalciferol, (DRISDOL) 1.25 MG (50000 UNIT) CAPS capsule Take 50,000 Units by mouth once a week.  . [DISCONTINUED] carvedilol (COREG) 12.5 MG tablet Take 12.5 mg by mouth 2 (two) times daily.       Allergies:   Beef-derived products, Chicken  protein, Lisinopril, and Peanut-containing drug products   Social History   Socioeconomic History  . Marital status: Married    Spouse name: Not on file  . Number of children: Not on file  . Years of education: Not on file  . Highest education level: Not on file  Occupational History  . Not on file  Tobacco Use  . Smoking status: Former Research scientist (life sciences)  . Smokeless tobacco: Never Used  Vaping Use  . Vaping Use: Never used  Substance and Sexual Activity  . Alcohol use: Yes    Alcohol/week: 6.0 standard drinks    Types: 2 Cans of beer, 4 Standard drinks or equivalent per week    Comment: 5-6 drinks/week  . Drug use: No  . Sexual activity: Not on file  Other Topics Concern  . Not on file  Social History Narrative  . Not on file   Social Determinants of Health   Financial Resource Strain:   . Difficulty of Paying Living Expenses: Not on file  Food Insecurity:   . Worried About Charity fundraiser in the Last Year: Not on file  . Ran Out of Food in the Last Year: Not on file  Transportation Needs:   . Lack of Transportation (Medical): Not on file  . Lack of Transportation (Non-Medical): Not on file  Physical Activity:   . Days of Exercise per Week: Not on file  . Minutes of Exercise per Session: Not on file  Stress:   . Feeling of Stress : Not on file  Social Connections:   . Frequency of Communication with Friends and Family: Not on file  . Frequency of Social Gatherings with Friends and Family: Not on file  . Attends Religious Services: Not on file  . Active Member of Clubs or Organizations: Not on file  . Attends Archivist Meetings: Not on file  . Marital Status: Not on file     Family History: The patient's family history includes Alzheimer's disease in his mother; Cancer in his mother; Diabetes in his father and sister; Heart attack in his father; Hypertension in his sister.  ROS:   Please see the history of present illness.    Not getting physical  activity.  Concerned about weight gain and abdominal obesity.  Is not getting much aerobic activity in.  He is sleeping okay.  Not very compliant with CPAP.  Realizes that he has a lot of risk factors that need to be better managed.  All other systems reviewed and are negative.  EKGs/Labs/Other Studies Reviewed:    The following studies were reviewed today:  2D Doppler echocardiogram performed 2019: Study Conclusions   - Left ventricle: The cavity size was normal. There was moderate  concentric hypertrophy.  Systolic function was normal. The  estimated ejection fraction was in the range of 55% to 60%. Wall  motion was normal; there were no regional wall motion  abnormalities. Doppler parameters are consistent with abnormal  left ventricular relaxation (grade 1 diastolic dysfunction).  Doppler parameters are consistent with indeterminate ventricular  filling pressure.  - Aortic valve: Transvalvular velocity was within the normal range.  There was no stenosis. There was no regurgitation.  - Mitral valve: Transvalvular velocity was within the normal range.  There was no evidence for stenosis. There was trivial  regurgitation.  - Right ventricle: The cavity size was normal. Wall thickness was  normal.  - Atrial septum: No defect or patent foramen ovale was identified  by color flow Doppler.  - Tricuspid valve: There was trivial regurgitation.  - Pulmonary arteries: Systolic pressure was within the normal  range.  - Global longitudinal strain -19.0% (normal).   MYOCARDIAL perfusion imaging 04/27/2017: Study Highlights    There was no ST segment deviation noted during stress.  The study is normal.  This is a low risk study.   Normal pharmacologic nuclear study with no evidence for prior infarct or ischemia.   EKG:  EKG sinus rhythm, left anterior hemiblock, right bundle branch block.  When compared to formed in October 2020, changes noted.  Recent  Labs: 01/20/2019: ALT 9  Recent Lipid Panel    Component Value Date/Time   CHOL 167 07/26/2019 1142   TRIG 99 07/26/2019 1142   HDL 60 07/26/2019 1142   CHOLHDL 2.8 07/26/2019 1142   LDLCALC 89 07/26/2019 1142    Physical Exam:    VS:  BP 138/76   Pulse 77   Ht 5\' 8"  (1.727 m)   Wt 269 lb 12.8 oz (122.4 kg)   SpO2 98%   BMI 41.02 kg/m     Wt Readings from Last 3 Encounters:  12/13/19 269 lb 12.8 oz (122.4 kg)  04/25/19 252 lb (114.3 kg)  12/09/18 260 lb 9.6 oz (118.2 kg)     GEN: Morbidly obese.  BMI severely elevated.. No acute distress HEENT: Normal NECK: No JVD. LYMPHATICS: No lymphadenopathy CARDIAC:  RRR without murmur, gallop, or edema. VASCULAR:  Normal Pulses. No bruits. RESPIRATORY:  Clear to auscultation without rales, wheezing or rhonchi  ABDOMEN: Protuberant abdomen.  Nontender., No pulsatile mass, MUSCULOSKELETAL: No deformity  SKIN: Warm and dry NEUROLOGIC:  Alert and oriented x 3 PSYCHIATRIC:  Normal affect   ASSESSMENT:    1. CAD in native artery   2. Essential hypertension   3. OSA treated with BiPAP   4. Hyperlipidemia, unspecified hyperlipidemia type   5. Right bundle branch block   6. Educated about COVID-19 virus infection   7. Obesity (BMI 30-39.9)    PLAN:    In order of problems listed above:  1. Secondary prevention discussed.  He is following way short of aerobic activity requirements.  He also has significant abdominal obesity which increases the risk of cardiovascular events.  Weight loss is a significant requirement for him.  He is motivated.  We will refer him to the [facility] nutritional weight loss center. 2. Blood pressure target 130/80 mmHg.  Low-salt diet/DASH DIET discussed.  He needs to incorporate more fruit and veggies in his diet.  2500 mg sodium restriction discussed.  This needs to be reinforced at the nutritional center. 3. I encourage compliance with CPAP. 4. Continue Zetia and high intensity atorvastatin.  Most  recent LDL cholesterol was 63 in  September.  He is below target.  Continue same therapy. 5. Bifascicular block is unchanged. 6. He has been vaccinated for Covid and is looking forward to getting the booster. 7. Obesity has progressed from a BMI less than 40 to a BMI of 41.  Referral to weight loss clinic.  Overall education and awareness concerning secondary risk prevention was discussed in detail: LDL less than 70, hemoglobin A1c less than 7, blood pressure target less than 130/80 mmHg, >150 minutes of moderate aerobic activity per week, avoidance of smoking, weight control (via diet and exercise), and continued surveillance/management of/for obstructive sleep apnea.  Plan clinical follow-up in 1 year  Medication Adjustments/Labs and Tests Ordered: Current medicines are reviewed at length with the patient today.  Concerns regarding medicines are outlined above.  Orders Placed This Encounter  Procedures  . Amb Ref to Medical Weight Management  . EKG 12-Lead   Meds ordered this encounter  Medications  . carvedilol (COREG) 12.5 MG tablet    Sig: Take 1 tablet (12.5 mg total) by mouth 2 (two) times daily.    Dispense:  90 tablet    Refill:  3    Patient Instructions  Medication Instructions:  Your physician recommends that you continue on your current medications as directed. Please refer to the Current Medication list given to you today.  *If you need a refill on your cardiac medications before your next appointment, please call your pharmacy*   Lab Work: None If you have labs (blood work) drawn today and your tests are completely normal, you will receive your results only by: Marland Kitchen MyChart Message (if you have MyChart) OR . A paper copy in the mail If you have any lab test that is abnormal or we need to change your treatment, we will call you to review the results.   Testing/Procedures: None   Follow-Up: At [facility], you and your health needs are our priority.  As part of  our continuing mission to provide you with exceptional heart care, we have created designated Provider Care Teams.  These Care Teams include your primary Cardiologist (physician) and Advanced Practice Providers (APPs -  Physician Assistants and Nurse Practitioners) who all work together to provide you with the care you need, when you need it.  We recommend signing up for the patient portal called "MyChart".  Sign up information is provided on this After Visit Summary.  MyChart is used to connect with patients for Virtual Visits (Telemedicine).  Patients are able to view lab/test results, encounter notes, upcoming appointments, etc.  Non-urgent messages can be sent to your provider as well.   To learn more about what you can do with MyChart, go to [email or web address].    Your next appointment:   12 month(s)  The format for your next appointment:   In Person  Provider:   You may see Sinclair Grooms, MD or one of the following Advanced Practice Providers on your designated Care Team:    Truitt Merle, NP  Cecilie Kicks, NP  Kathyrn Drown, NP    Other Instructions   You have been referred to our weight management team.  They will contact you to get you scheduled.  Your provider recommends that you maintain 150 minutes per week of moderate aerobic activity.     Signed, Sinclair Grooms, MD  12/13/2019 3:54 PM    [place]

## 2019-12-13 ENCOUNTER — Encounter: Payer: Self-pay | Admitting: Interventional Cardiology

## 2019-12-13 ENCOUNTER — Other Ambulatory Visit: Payer: Self-pay | Admitting: Interventional Cardiology

## 2019-12-13 ENCOUNTER — Other Ambulatory Visit: Payer: Self-pay

## 2019-12-13 ENCOUNTER — Ambulatory Visit: Payer: PPO | Admitting: Interventional Cardiology

## 2019-12-13 VITALS — BP 138/76 | HR 77 | Ht 68.0 in | Wt 269.8 lb

## 2019-12-13 DIAGNOSIS — G4733 Obstructive sleep apnea (adult) (pediatric): Secondary | ICD-10-CM | POA: Diagnosis not present

## 2019-12-13 DIAGNOSIS — E785 Hyperlipidemia, unspecified: Secondary | ICD-10-CM

## 2019-12-13 DIAGNOSIS — Z7189 Other specified counseling: Secondary | ICD-10-CM

## 2019-12-13 DIAGNOSIS — E669 Obesity, unspecified: Secondary | ICD-10-CM | POA: Diagnosis not present

## 2019-12-13 DIAGNOSIS — I2510 Atherosclerotic heart disease of native coronary artery without angina pectoris: Secondary | ICD-10-CM

## 2019-12-13 DIAGNOSIS — I4510 Unspecified right bundle-branch block: Secondary | ICD-10-CM

## 2019-12-13 DIAGNOSIS — I10 Essential (primary) hypertension: Secondary | ICD-10-CM

## 2019-12-13 MED ORDER — CARVEDILOL 12.5 MG PO TABS: 12.5000 mg | ORAL_TABLET | Freq: Two times a day (BID) | ORAL | 3 refills | Status: DC

## 2019-12-13 NOTE — Patient Instructions (Signed)
Medication Instructions:  Your physician recommends that you continue on your current medications as directed. Please refer to the Current Medication list given to you today.  *If you need a refill on your cardiac medications before your next appointment, please call your pharmacy*   Lab Work: None If you have labs (blood work) drawn today and your tests are completely normal, you will receive your results only by: Marland Kitchen MyChart Message (if you have MyChart) OR . A paper copy in the mail If you have any lab test that is abnormal or we need to change your treatment, we will call you to review the results.   Testing/Procedures: None   Follow-Up: At [facility], you and your health needs are our priority.  As part of our continuing mission to provide you with exceptional heart care, we have created designated Provider Care Teams.  These Care Teams include your primary Cardiologist (physician) and Advanced Practice Providers (APPs -  Physician Assistants and Nurse Practitioners) who all work together to provide you with the care you need, when you need it.  We recommend signing up for the patient portal called "MyChart".  Sign up information is provided on this After Visit Summary.  MyChart is used to connect with patients for Virtual Visits (Telemedicine).  Patients are able to view lab/test results, encounter notes, upcoming appointments, etc.  Non-urgent messages can be sent to your provider as well.   To learn more about what you can do with MyChart, go to [email or web address].    Your next appointment:   12 month(s)  The format for your next appointment:   In Person  Provider:   You may see Sinclair Grooms, MD or one of the following Advanced Practice Providers on your designated Care Team:    Truitt Merle, NP  Cecilie Kicks, NP  Kathyrn Drown, NP    Other Instructions   You have been referred to our weight management team.  They will contact you to get you  scheduled.  Your provider recommends that you maintain 150 minutes per week of moderate aerobic activity.

## 2019-12-23 ENCOUNTER — Other Ambulatory Visit: Payer: Self-pay

## 2019-12-23 ENCOUNTER — Ambulatory Visit: Payer: PPO | Attending: Internal Medicine

## 2019-12-23 DIAGNOSIS — Z23 Encounter for immunization: Secondary | ICD-10-CM

## 2019-12-23 NOTE — Progress Notes (Signed)
   Covid-19 Vaccination Clinic  Name:  Richard [name]    MRN: [id number] DOB: 11-Aug-1945  12/23/2019  [name] was observed post Covid-19 immunization for 15 minutes without incident. He was provided with Vaccine Information Sheet and instruction to access the V-Safe system.   [name] was instructed to call 911 with any severe reactions post vaccine: Marland Kitchen Difficulty breathing  . Swelling of face and throat  . A fast heartbeat  . A bad rash all over body  . Dizziness and weakness

## 2019-12-25 DIAGNOSIS — G4733 Obstructive sleep apnea (adult) (pediatric): Secondary | ICD-10-CM | POA: Diagnosis not present

## 2020-01-09 ENCOUNTER — Other Ambulatory Visit (HOSPITAL_COMMUNITY): Payer: Self-pay | Admitting: Pulmonary Disease

## 2020-01-09 DIAGNOSIS — I5030 Unspecified diastolic (congestive) heart failure: Secondary | ICD-10-CM | POA: Diagnosis not present

## 2020-01-09 DIAGNOSIS — J4540 Moderate persistent asthma, uncomplicated: Secondary | ICD-10-CM | POA: Diagnosis not present

## 2020-01-09 DIAGNOSIS — I2510 Atherosclerotic heart disease of native coronary artery without angina pectoris: Secondary | ICD-10-CM | POA: Diagnosis not present

## 2020-01-09 DIAGNOSIS — J441 Chronic obstructive pulmonary disease with (acute) exacerbation: Secondary | ICD-10-CM | POA: Diagnosis not present

## 2020-01-09 DIAGNOSIS — E559 Vitamin D deficiency, unspecified: Secondary | ICD-10-CM | POA: Diagnosis not present

## 2020-01-09 DIAGNOSIS — E876 Hypokalemia: Secondary | ICD-10-CM | POA: Diagnosis not present

## 2020-01-09 DIAGNOSIS — E7800 Pure hypercholesterolemia, unspecified: Secondary | ICD-10-CM | POA: Diagnosis not present

## 2020-01-09 DIAGNOSIS — R0609 Other forms of dyspnea: Secondary | ICD-10-CM | POA: Diagnosis not present

## 2020-01-09 DIAGNOSIS — E669 Obesity, unspecified: Secondary | ICD-10-CM | POA: Diagnosis not present

## 2020-01-09 DIAGNOSIS — D638 Anemia in other chronic diseases classified elsewhere: Secondary | ICD-10-CM | POA: Diagnosis not present

## 2020-01-09 DIAGNOSIS — I119 Hypertensive heart disease without heart failure: Secondary | ICD-10-CM | POA: Diagnosis not present

## 2020-01-09 DIAGNOSIS — Z79899 Other long term (current) drug therapy: Secondary | ICD-10-CM | POA: Diagnosis not present

## 2020-01-11 DIAGNOSIS — Z1159 Encounter for screening for other viral diseases: Secondary | ICD-10-CM | POA: Diagnosis not present

## 2020-01-11 DIAGNOSIS — H25013 Cortical age-related cataract, bilateral: Secondary | ICD-10-CM | POA: Diagnosis not present

## 2020-01-11 DIAGNOSIS — H524 Presbyopia: Secondary | ICD-10-CM | POA: Diagnosis not present

## 2020-01-16 DIAGNOSIS — D175 Benign lipomatous neoplasm of intra-abdominal organs: Secondary | ICD-10-CM | POA: Diagnosis not present

## 2020-01-16 DIAGNOSIS — D124 Benign neoplasm of descending colon: Secondary | ICD-10-CM | POA: Diagnosis not present

## 2020-01-16 DIAGNOSIS — D120 Benign neoplasm of cecum: Secondary | ICD-10-CM | POA: Diagnosis not present

## 2020-01-16 DIAGNOSIS — K5730 Diverticulosis of large intestine without perforation or abscess without bleeding: Secondary | ICD-10-CM | POA: Diagnosis not present

## 2020-01-16 DIAGNOSIS — D122 Benign neoplasm of ascending colon: Secondary | ICD-10-CM | POA: Diagnosis not present

## 2020-01-16 DIAGNOSIS — K640 First degree hemorrhoids: Secondary | ICD-10-CM | POA: Diagnosis not present

## 2020-01-16 DIAGNOSIS — K621 Rectal polyp: Secondary | ICD-10-CM | POA: Diagnosis not present

## 2020-01-16 DIAGNOSIS — D123 Benign neoplasm of transverse colon: Secondary | ICD-10-CM | POA: Diagnosis not present

## 2020-01-16 DIAGNOSIS — Z86010 Personal history of colonic polyps: Secondary | ICD-10-CM | POA: Diagnosis not present

## 2020-01-22 DIAGNOSIS — K621 Rectal polyp: Secondary | ICD-10-CM | POA: Diagnosis not present

## 2020-01-22 DIAGNOSIS — D122 Benign neoplasm of ascending colon: Secondary | ICD-10-CM | POA: Diagnosis not present

## 2020-01-22 DIAGNOSIS — D124 Benign neoplasm of descending colon: Secondary | ICD-10-CM | POA: Diagnosis not present

## 2020-01-22 DIAGNOSIS — D123 Benign neoplasm of transverse colon: Secondary | ICD-10-CM | POA: Diagnosis not present

## 2020-01-22 DIAGNOSIS — D120 Benign neoplasm of cecum: Secondary | ICD-10-CM | POA: Diagnosis not present

## 2020-01-29 ENCOUNTER — Other Ambulatory Visit (HOSPITAL_COMMUNITY): Payer: Self-pay | Admitting: Pulmonary Disease

## 2020-03-18 ENCOUNTER — Other Ambulatory Visit (HOSPITAL_COMMUNITY): Payer: Self-pay | Admitting: Pulmonary Disease

## 2020-03-26 ENCOUNTER — Ambulatory Visit (INDEPENDENT_AMBULATORY_CARE_PROVIDER_SITE_OTHER): Payer: Self-pay | Admitting: Family Medicine

## 2020-03-26 DIAGNOSIS — G4733 Obstructive sleep apnea (adult) (pediatric): Secondary | ICD-10-CM | POA: Diagnosis not present

## 2020-03-28 ENCOUNTER — Other Ambulatory Visit (HOSPITAL_COMMUNITY): Payer: Self-pay | Admitting: Pulmonary Disease

## 2020-04-08 ENCOUNTER — Other Ambulatory Visit: Payer: Self-pay

## 2020-04-08 ENCOUNTER — Ambulatory Visit (INDEPENDENT_AMBULATORY_CARE_PROVIDER_SITE_OTHER): Payer: PPO | Admitting: Bariatrics

## 2020-04-08 ENCOUNTER — Encounter (INDEPENDENT_AMBULATORY_CARE_PROVIDER_SITE_OTHER): Payer: Self-pay | Admitting: Bariatrics

## 2020-04-08 VITALS — BP 120/70 | HR 75 | Ht 68.0 in | Wt 258.0 lb

## 2020-04-08 DIAGNOSIS — Z1331 Encounter for screening for depression: Secondary | ICD-10-CM

## 2020-04-08 DIAGNOSIS — G4733 Obstructive sleep apnea (adult) (pediatric): Secondary | ICD-10-CM

## 2020-04-08 DIAGNOSIS — R7309 Other abnormal glucose: Secondary | ICD-10-CM | POA: Diagnosis not present

## 2020-04-08 DIAGNOSIS — E7849 Other hyperlipidemia: Secondary | ICD-10-CM

## 2020-04-08 DIAGNOSIS — E559 Vitamin D deficiency, unspecified: Secondary | ICD-10-CM | POA: Diagnosis not present

## 2020-04-08 DIAGNOSIS — I10 Essential (primary) hypertension: Secondary | ICD-10-CM

## 2020-04-08 DIAGNOSIS — K219 Gastro-esophageal reflux disease without esophagitis: Secondary | ICD-10-CM | POA: Diagnosis not present

## 2020-04-08 DIAGNOSIS — I2510 Atherosclerotic heart disease of native coronary artery without angina pectoris: Secondary | ICD-10-CM | POA: Diagnosis not present

## 2020-04-08 DIAGNOSIS — R5383 Other fatigue: Secondary | ICD-10-CM

## 2020-04-08 DIAGNOSIS — Z0289 Encounter for other administrative examinations: Secondary | ICD-10-CM

## 2020-04-08 DIAGNOSIS — R0602 Shortness of breath: Secondary | ICD-10-CM | POA: Diagnosis not present

## 2020-04-08 DIAGNOSIS — E785 Hyperlipidemia, unspecified: Secondary | ICD-10-CM | POA: Diagnosis not present

## 2020-04-08 NOTE — Progress Notes (Signed)
Dear Dr. Tamala Julian,   Thank you for referring Richard [name] to our clinic. The following note includes my evaluation and treatment recommendations.  Chief Complaint:   OBESITY Richard [name] (MR# [id number]) is a 75 y.o. male who presents for evaluation and treatment of obesity and related comorbidities. Current BMI is Body mass index is 39.23 kg/m. Richard [name] has been struggling with his weight for many years and has been unsuccessful in either losing weight, maintaining weight loss, or reaching his healthy weight goal.  Richard [name] is currently in the action stage of change and ready to dedicate time achieving and maintaining a healthier weight. [name] is interested in becoming our patient and working on intensive lifestyle modifications including (but not limited to) diet and exercise for weight loss.  Richard [name] writes that he does like to [employer] and does not note any obstacles to cooking.  He craves fried foods.  Richard [name]'s habits were reviewed today and are as follows: His family eats meals together, he thinks his family will eat healthier with him, his desired weight loss is 65 pounds, he has been heavy most of his life, his heaviest weight ever was 286 pounds, he craves fried foods, he snacks frequently in the evenings, he skips breakfast frequently and he frequently eats larger portions than normal.  Depression Screen Richard [name]'s Food and Mood (modified PHQ-9) score was 5.  Depression screen [facility] 2/9 04/08/2020  Decreased Interest 1  Down, Depressed, Hopeless 0  PHQ - 2 Score 1  Altered sleeping 1  Tired, decreased energy 3  Change in appetite 0  Feeling bad or failure about yourself  0  Trouble concentrating 0  Moving slowly or fidgety/restless 0  Suicidal thoughts 0  PHQ-9 Score 5   Subjective:   1. Other fatigue Richard [name] denies daytime somnolence and denies waking up still tired. Patent has a history of symptoms of snoring. Richard [name] generally gets 6 or 7 hours of sleep per night, and  states that he has generally restful sleep. Snoring is present. Apneic episodes are not present. Epworth Sleepiness Score is 0.  Richard [name] has OSA and uses a BiPAP machine.  2. SOB (shortness of breath) on exertion Richard [name] notes increasing shortness of breath with exercising and seems to be worsening over time with weight gain. He notes getting out of breath sooner with activity than he used to. This has gotten worse recently. Richard [name] denies shortness of breath at rest or orthopnea.  3. Essential hypertension Controlled.  Review: taking medications as instructed, no medication side effects noted, no chest pain on exertion, no dyspnea on exertion, no swelling of ankles.  He is taking chlorthalidone and Coreg.  BP Readings from Last 3 Encounters:  04/08/20 120/70  12/13/19 138/76  04/25/19 (!) 164/89   4. Gastroesophageal reflux disease, unspecified whether esophagitis present Controlled with medication.  Richard [name] is taking Prilosec 40 mg daily for GERD.  5. OSA treated with BiPAP [name] has a diagnosis of sleep apnea. He is using a BiPAP machine.  6. Other hyperlipidemia [name] has hyperlipidemia and has been trying to improve his cholesterol levels with intensive lifestyle modification including a low saturated fat diet, exercise and weight loss. He denies any chest pain, claudication or myalgias.  He takes Lipitor 80 mg daily.  Lab Results  Component Value Date   ALT 9 01/20/2019   AST 18 01/20/2019   ALKPHOS 135 (H) 01/20/2019   BILITOT 0.6 01/20/2019   Lab Results  Component Value Date   CHOL 167  07/26/2019   HDL 60 07/26/2019   LDLCALC 89 07/26/2019   TRIG 99 07/26/2019   CHOLHDL 2.8 07/26/2019   7. CAD in native artery He takes Coreg and has nortriptyline if needed.  8. Vitamin D deficiency He is currently taking prescription vitamin D 50,000 IU each week. He denies nausea, vomiting or muscle weakness.  9. Elevated glucose Richard [name] has an elevated blood glucose level.  10.  Depression screening Richard [name] was screened for depression as part of his new patient workup today.  PHQ-9 is 5.  Assessment/Plan:   1. Other fatigue Richard [name] does feel that his weight is causing his energy to be lower than it should be. Fatigue may be related to obesity, depression or many other causes. Labs will be ordered, and in the meanwhile, Richard [name] will focus on self care including making healthy food choices, increasing physical activity and focusing on stress reduction.  - EKG 12-Lead - T3 - T4, free - TSH  2. SOB (shortness of breath) on exertion Richard [name] does feel that he gets out of breath more easily that he used to when he exercises. Richard [name]'s shortness of breath appears to be obesity related and exercise induced. He has agreed to work on weight loss and gradually increase exercise to treat his exercise induced shortness of breath. Will continue to monitor closely.  3. Essential hypertension Richard [name] is working on healthy weight loss and exercise to improve blood pressure control. We will watch for signs of hypotension as he continues his lifestyle modifications.  Continue medications.  4. Gastroesophageal reflux disease, unspecified whether esophagitis present Intensive lifestyle modifications are the first line treatment for this issue. We discussed several lifestyle modifications today and he will continue to work on diet, exercise and weight loss efforts. Orders and follow up as documented in patient record.  Continue Prilosec.   Counseling . If a person has gastroesophageal reflux disease (GERD), food and stomach acid move back up into the esophagus and cause symptoms or problems such as damage to the esophagus. . Anti-reflux measures include: raising the head of the bed, avoiding tight clothing or belts, avoiding eating late at night, not lying down shortly after mealtime, and achieving weight loss. . Avoid ASA, NSAID's, caffeine, alcohol, and tobacco.  . OTC Pepcid and/or Tums are often  very helpful for as needed use.  Marland Kitchen However, for persisting chronic or daily symptoms, stronger medications like Omeprazole may be needed. . You may need to avoid foods and drinks such as: ? Coffee and tea (with or without caffeine). ? Drinks that contain alcohol. ? Energy drinks and sports drinks. ? Bubbly (carbonated) drinks or sodas. ? Chocolate and cocoa. ? Peppermint and mint flavorings. ? Garlic and onions. ? Horseradish. ? Spicy and acidic foods. These include peppers, chili powder, curry powder, vinegar, hot sauces, and BBQ sauce. ? Citrus fruit juices and citrus fruits, such as oranges, lemons, and limes. ? Tomato-based foods. These include red sauce, chili, salsa, and pizza with red sauce. ? Fried and fatty foods. These include donuts, french fries, potato chips, and high-fat dressings. ? High-fat meats. These include hot dogs, rib eye steak, sausage, ham, and bacon.  5. OSA treated with BiPAP Intensive lifestyle modifications are the first line treatment for this issue. We discussed several lifestyle modifications today and he will continue to work on diet, exercise and weight loss efforts. We will continue to monitor.  Continue use of BiPAP.  6. Other hyperlipidemia Cardiovascular risk and specific lipid/LDL goals reviewed.  We discussed  several lifestyle modifications today and Deveron will continue to work on diet, exercise and weight loss efforts.  Continue medications.  Will check lipid panel and CMP today.   Counseling Intensive lifestyle modifications are the first line treatment for this issue. . Dietary changes: Increase soluble fiber. Decrease simple carbohydrates. . Exercise changes: Moderate to vigorous-intensity aerobic activity 150 minutes per week if tolerated. . Lipid-lowering medications: see documented in medical record.  - Lipid Panel With LDL/HDL Ratio  7. CAD in native artery Follow-up with Cardiology once yearly.  Will check lipid panel today.  - Lipid  Panel With LDL/HDL Ratio  8. Vitamin D deficiency Low Vitamin D level contributes to fatigue and are associated with obesity, breast, and colon cancer.  Will check vitamin D level today.  - VITAMIN D 25 Hydroxy (Vit-D Deficiency, Fractures)  9. Elevated glucose Will check CMP, A1c, and insulin level today, as per below.  - Comprehensive metabolic panel - Hemoglobin A1c - Insulin, random  10. Depression screening Depression screening is negative today.  11. Class 2 severe obesity with serious comorbidity and body mass index (BMI) of 39.0 to 39.9 in adult, unspecified obesity type [facility])  Tristyn is currently in the action stage of change and his goal is to continue with weight loss efforts. I recommend Zerion begin the structured treatment plan as follows:  He has agreed to the Category 2 Plan.  He will work on meal planning, decreasing snacking at night, and will work on his portion sizes.  Exercise goals: No exercise has been prescribed at this time.   Behavioral modification strategies: increasing lean protein intake, decreasing simple carbohydrates, increasing vegetables, increasing water intake, decreasing eating out, no skipping meals, meal planning and cooking strategies, keeping healthy foods in the home and planning for success.  He was informed of the importance of frequent follow-up visits to maximize his success with intensive lifestyle modifications for his multiple health conditions. He was informed we would discuss his lab results at his next visit unless there is a critical issue that needs to be addressed sooner. Kacey agreed to keep his next visit at the agreed upon time to discuss these results.  Objective:   Blood pressure 120/70, pulse 75, height 5\' 8"  (1.727 m), weight 258 lb (117 kg), SpO2 94 %. Body mass index is 39.23 kg/m.  EKG: Normal sinus rhythm, rate 69 bpm.  Indirect Calorimeter completed today shows a VO2 of 261 and a REE of 1818.  His calculated basal  metabolic rate is 8309 thus his basal metabolic rate is worse than expected.  General: Cooperative, alert, well developed, in no acute distress. HEENT: Conjunctivae and lids unremarkable. Cardiovascular: Regular rhythm.  Lungs: Normal work of breathing. Neurologic: No focal deficits.   Lab Results  Component Value Date   CREATININE 1.10 05/18/2017   BUN 18 05/18/2017   NA 137 05/18/2017   K 3.8 05/18/2017   CL 95 (L) 05/18/2017   CO2 23 05/18/2017   Lab Results  Component Value Date   ALT 9 01/20/2019   AST 18 01/20/2019   ALKPHOS 135 (H) 01/20/2019   BILITOT 0.6 01/20/2019   Lab Results  Component Value Date   HGBA1C 6.1 (H) 04/30/2013   Lab Results  Component Value Date   TSH 0.184 (L) 04/30/2013   Lab Results  Component Value Date   CHOL 167 07/26/2019   HDL 60 07/26/2019   LDLCALC 89 07/26/2019   TRIG 99 07/26/2019   CHOLHDL 2.8 07/26/2019  Lab Results  Component Value Date   WBC 5.3 05/18/2017   HGB 14.4 05/18/2017   HCT 43.2 05/18/2017   MCV 86 05/18/2017   PLT 248 05/18/2017   Obesity Behavioral Intervention:   Approximately 15 minutes were spent on the discussion below.  ASK: We discussed the diagnosis of obesity with Richard [name] today and Ladarrian agreed to give [place] permission to discuss obesity behavioral modification therapy today.  ASSESS: Douglas has the diagnosis of obesity and his BMI today is 39.3. Bhavin is in the action stage of change.   ADVISE: Antoneo was educated on the multiple health risks of obesity as well as the benefit of weight loss to improve his health. He was advised of the need for long term treatment and the importance of lifestyle modifications to improve his current health and to decrease his risk of future health problems.  AGREE: Multiple dietary modification options and treatment options were discussed and Noel agreed to follow the recommendations documented in the above note.  ARRANGE: Ovie was educated on the importance of  frequent visits to treat obesity as outlined per CMS and USPSTF guidelines and agreed to schedule his next follow up appointment today.  Attestation Statements:   Reviewed by clinician on day of visit: allergies, medications, problem list, medical history, surgical history, family history, social history, and previous encounter notes.  I, Water quality scientist, CMA, am acting as Location manager for [employer], DO  I have reviewed the above documentation for accuracy and completeness, and I agree with the above. Jearld Lesch, DO

## 2020-04-09 ENCOUNTER — Encounter (INDEPENDENT_AMBULATORY_CARE_PROVIDER_SITE_OTHER): Payer: Self-pay | Admitting: Bariatrics

## 2020-04-09 ENCOUNTER — Ambulatory Visit (INDEPENDENT_AMBULATORY_CARE_PROVIDER_SITE_OTHER): Payer: Self-pay | Admitting: Family Medicine

## 2020-04-09 DIAGNOSIS — R7303 Prediabetes: Secondary | ICD-10-CM

## 2020-04-09 HISTORY — DX: Prediabetes: R73.03

## 2020-04-09 LAB — HEMOGLOBIN A1C
Est. average glucose Bld gHb Est-mCnc: 131 mg/dL
Hgb A1c MFr Bld: 6.2 % — ABNORMAL HIGH (ref 4.8–5.6)

## 2020-04-09 LAB — COMPREHENSIVE METABOLIC PANEL
ALT: 12 IU/L (ref 0–44)
AST: 25 IU/L (ref 0–40)
Albumin/Globulin Ratio: 1.8 (ref 1.2–2.2)
Albumin: 4.6 g/dL (ref 3.7–4.7)
Alkaline Phosphatase: 116 IU/L (ref 44–121)
BUN/Creatinine Ratio: 13 (ref 10–24)
BUN: 13 mg/dL (ref 8–27)
Bilirubin Total: 0.5 mg/dL (ref 0.0–1.2)
CO2: 24 mmol/L (ref 20–29)
Calcium: 9.4 mg/dL (ref 8.6–10.2)
Chloride: 92 mmol/L — ABNORMAL LOW (ref 96–106)
Creatinine, Ser: 0.98 mg/dL (ref 0.76–1.27)
GFR calc Af Amer: 87 mL/min/{1.73_m2} (ref >59)
GFR calc non Af Amer: 76 mL/min/{1.73_m2} (ref >59)
Globulin, Total: 2.6 g/dL (ref 1.5–4.5)
Glucose: 124 mg/dL — ABNORMAL HIGH (ref 65–99)
Potassium: 4 mmol/L (ref 3.5–5.2)
Sodium: 132 mmol/L — ABNORMAL LOW (ref 134–144)
Total Protein: 7.2 g/dL (ref 6.0–8.5)

## 2020-04-09 LAB — LIPID PANEL WITH LDL/HDL RATIO
Cholesterol, Total: 144 mg/dL (ref 100–199)
HDL: 53 mg/dL (ref >39)
LDL Chol Calc (NIH): 75 mg/dL (ref 0–99)
LDL/HDL Ratio: 1.4 ratio (ref 0.0–3.6)
Triglycerides: 82 mg/dL (ref 0–149)
VLDL Cholesterol Cal: 16 mg/dL (ref 5–40)

## 2020-04-09 LAB — TSH: TSH: 0.922 u[IU]/mL (ref 0.450–4.500)

## 2020-04-09 LAB — INSULIN, RANDOM: INSULIN: 5.3 u[IU]/mL (ref 2.6–24.9)

## 2020-04-09 LAB — T3: T3, Total: 110 ng/dL (ref 71–180)

## 2020-04-09 LAB — T4, FREE: Free T4: 1.51 ng/dL (ref 0.82–1.77)

## 2020-04-09 LAB — VITAMIN D 25 HYDROXY (VIT D DEFICIENCY, FRACTURES): Vit D, 25-Hydroxy: 60 ng/mL (ref 30.0–100.0)

## 2020-04-22 ENCOUNTER — Ambulatory Visit (INDEPENDENT_AMBULATORY_CARE_PROVIDER_SITE_OTHER): Payer: PPO | Admitting: Bariatrics

## 2020-04-22 ENCOUNTER — Encounter (INDEPENDENT_AMBULATORY_CARE_PROVIDER_SITE_OTHER): Payer: Self-pay | Admitting: Bariatrics

## 2020-04-22 ENCOUNTER — Other Ambulatory Visit: Payer: Self-pay

## 2020-04-22 VITALS — BP 125/70 | HR 80 | Temp 98.4°F | Ht 68.0 in | Wt 260.0 lb

## 2020-04-22 DIAGNOSIS — Z6839 Body mass index (BMI) 39.0-39.9, adult: Secondary | ICD-10-CM | POA: Diagnosis not present

## 2020-04-22 DIAGNOSIS — E559 Vitamin D deficiency, unspecified: Secondary | ICD-10-CM | POA: Diagnosis not present

## 2020-04-22 DIAGNOSIS — I10 Essential (primary) hypertension: Secondary | ICD-10-CM | POA: Diagnosis not present

## 2020-04-22 DIAGNOSIS — R7303 Prediabetes: Secondary | ICD-10-CM

## 2020-04-22 DIAGNOSIS — K5909 Other constipation: Secondary | ICD-10-CM

## 2020-04-22 DIAGNOSIS — E669 Obesity, unspecified: Secondary | ICD-10-CM

## 2020-04-24 NOTE — Progress Notes (Signed)
Chief Complaint:   OBESITY Richard [name] is here to discuss his progress with his obesity treatment plan along with follow-up of his obesity related diagnoses. Richard [name] is on the Category 2 Plan and states he is following his eating plan approximately 100% of the time. Richard [name] states he is not exercising regularly at this time.  Today's visit was #: 2 Starting weight: 258 lbs Starting date: 04/08/2020 Today's weight: 260 lbs Today's date: 04/22/2020 Total lbs lost to date: 0 Total lbs lost since last in-office visit: 0  Interim History: Richard [name] is up 2 pounds but states that he has been following the plan 100%.  Subjective:   1. Prediabetes Richard [name] has a diagnosis of prediabetes based on his elevated HgA1c and was informed this puts him at greater risk of developing diabetes. He continues to work on diet and exercise to decrease his risk of diabetes. He denies nausea or hypoglycemia.  He is on no medications.  A1c 6.2, insulin 5.3.  Lab Results  Component Value Date   HGBA1C 6.2 (H) 04/08/2020   Lab Results  Component Value Date   INSULIN 5.3 04/08/2020   2. Primary hypertension Blood pressure is controlled.  Review: taking medications as instructed, no medication side effects noted, no chest pain on exertion, no dyspnea on exertion, no swelling of ankles.   BP Readings from Last 3 Encounters:  04/22/20 125/70  04/08/20 120/70  12/13/19 138/76   3. Other constipation He is using a stool softener.  He has increased his protein.  4. Vitamin D deficiency Richard [name]'s Vitamin D level was 60.0 on 04/08/2020.   Assessment/Plan:   1. Prediabetes Richard [name] will continue to work on weight loss, exercise, and decreasing simple carbohydrates to help decrease the risk of diabetes. Decrease carbohydrates and increase healthy fats and protein.   2. Primary hypertension Richard [name] is working on healthy weight loss and exercise to improve blood pressure control. We will watch for signs of hypotension as he  continues his lifestyle modifications.  Continue medications.  No added dietary salt.  3. Other constipation Richard [name] was informed that a decrease in bowel movement frequency is normal while losing weight, but stools should not be hard or painful. Orders and follow up as documented in patient record.  Add MiraLAX to water in the morning.  Take Dulcolax OTC.  Counseling Getting to Good Bowel Health: Your goal is to have one soft bowel movement each day. Drink at least 8 glasses of water each day. Eat plenty of fiber (goal is over 25 grams each day). It is best to get most of your fiber from dietary sources which includes leafy green vegetables, fresh fruit, and whole grains. You may need to add fiber with the help of OTC fiber supplements. These include Metamucil, Citrucel, and Flaxseed. If you are still having trouble, try adding Miralax or Magnesium Citrate. If all of these changes do not work, Richard [name].  4. Vitamin D deficiency Low Vitamin D level contributes to fatigue and are associated with obesity, breast, and colon cancer. He will talk with his PCP about vitamin D and considering going to OTC vitamin D 1,000 IU daily.   5. Class 2 severe obesity with serious comorbidity and body mass index (BMI) of 39.0 to 39.9 in adult, unspecified obesity type Richard [name])  Richard [name] is currently in the action stage of change. As such, his goal is to continue with weight loss efforts. He has agreed to the Category 2 Plan.   He will work  on meal planning.  Labs from 04/08/2020, including CMP, lipid panel, vitamin D, A1c, insulin, and thyroid panel, were reviewed today.  Protein Equivalent sheet provided.  Exercise goals: He will consider going to the gym.  Behavioral modification strategies: increasing lean protein intake, decreasing simple carbohydrates, increasing vegetables, increasing water intake, decreasing eating out, no skipping meals, meal planning and cooking strategies, keeping healthy foods in  the home and planning for success.  Richard [name] has agreed to follow-up with our clinic in 2 weeks. He was informed of the importance of frequent follow-up visits to maximize his success with intensive lifestyle modifications for his multiple health conditions.   Objective:   Blood pressure 125/70, pulse 80, temperature 98.4 F (36.9 C), height 5\' 8"  (1.727 m), weight 260 lb (117.9 kg), SpO2 97 %. Body mass index is 39.53 kg/m.  General: Cooperative, alert, well developed, in no acute distress. HEENT: Conjunctivae and lids unremarkable. Cardiovascular: Regular rhythm.  Lungs: Normal work of breathing. Neurologic: No focal deficits.   Lab Results  Component Value Date   CREATININE 0.98 04/08/2020   BUN 13 04/08/2020   NA 132 (L) 04/08/2020   K 4.0 04/08/2020   CL 92 (L) 04/08/2020   CO2 24 04/08/2020   Lab Results  Component Value Date   ALT 12 04/08/2020   AST 25 04/08/2020   ALKPHOS 116 04/08/2020   BILITOT 0.5 04/08/2020   Lab Results  Component Value Date   HGBA1C 6.2 (H) 04/08/2020   HGBA1C 6.1 (H) 04/30/2013   Lab Results  Component Value Date   INSULIN 5.3 04/08/2020   Lab Results  Component Value Date   TSH 0.922 04/08/2020   Lab Results  Component Value Date   CHOL 144 04/08/2020   HDL 53 04/08/2020   LDLCALC 75 04/08/2020   TRIG 82 04/08/2020   CHOLHDL 2.8 07/26/2019   Lab Results  Component Value Date   WBC 5.3 05/18/2017   HGB 14.4 05/18/2017   HCT 43.2 05/18/2017   MCV 86 05/18/2017   PLT 248 05/18/2017   Obesity Behavioral Intervention:   Approximately 15 minutes were spent on the discussion below.  ASK: We discussed the diagnosis of obesity with Richard [name] today and Richard [name] agreed to give [place] permission to discuss obesity behavioral modification therapy today.  ASSESS: Richard [name] has the diagnosis of obesity and his BMI today is 39.5. Richard [name] is in the action stage of change.   ADVISE: Richard [name] was educated on the multiple health risks of obesity as well  as the benefit of weight loss to improve his health. He was advised of the need for long term treatment and the importance of lifestyle modifications to improve his current health and to decrease his risk of future health problems.  AGREE: Multiple dietary modification options and treatment options were discussed and Richard [name] agreed to follow the recommendations documented in the above note.  ARRANGE: Richard [name] was educated on the importance of frequent visits to treat obesity as outlined per CMS and USPSTF guidelines and agreed to schedule his next follow up appointment today.  Attestation Statements:   Reviewed by clinician on day of visit: allergies, medications, problem list, medical history, surgical history, family history, social history, and previous encounter notes.  I, Water quality scientist, CMA, am acting as Location manager for [employer], DO  I have reviewed the above documentation for accuracy and completeness, and I agree with the above. Jearld Lesch, DO

## 2020-04-28 ENCOUNTER — Encounter (INDEPENDENT_AMBULATORY_CARE_PROVIDER_SITE_OTHER): Payer: Self-pay | Admitting: Bariatrics

## 2020-04-30 ENCOUNTER — Other Ambulatory Visit (HOSPITAL_COMMUNITY): Payer: Self-pay | Admitting: Pulmonary Disease

## 2020-04-30 DIAGNOSIS — E559 Vitamin D deficiency, unspecified: Secondary | ICD-10-CM | POA: Diagnosis not present

## 2020-04-30 DIAGNOSIS — I2510 Atherosclerotic heart disease of native coronary artery without angina pectoris: Secondary | ICD-10-CM | POA: Diagnosis not present

## 2020-04-30 DIAGNOSIS — E049 Nontoxic goiter, unspecified: Secondary | ICD-10-CM | POA: Diagnosis not present

## 2020-04-30 DIAGNOSIS — E7800 Pure hypercholesterolemia, unspecified: Secondary | ICD-10-CM | POA: Diagnosis not present

## 2020-04-30 DIAGNOSIS — I5030 Unspecified diastolic (congestive) heart failure: Secondary | ICD-10-CM | POA: Diagnosis not present

## 2020-04-30 DIAGNOSIS — J4530 Mild persistent asthma, uncomplicated: Secondary | ICD-10-CM | POA: Diagnosis not present

## 2020-04-30 DIAGNOSIS — J441 Chronic obstructive pulmonary disease with (acute) exacerbation: Secondary | ICD-10-CM | POA: Diagnosis not present

## 2020-04-30 DIAGNOSIS — K30 Functional dyspepsia: Secondary | ICD-10-CM | POA: Diagnosis not present

## 2020-04-30 DIAGNOSIS — E876 Hypokalemia: Secondary | ICD-10-CM | POA: Diagnosis not present

## 2020-04-30 DIAGNOSIS — Z79891 Long term (current) use of opiate analgesic: Secondary | ICD-10-CM | POA: Diagnosis not present

## 2020-04-30 DIAGNOSIS — I119 Hypertensive heart disease without heart failure: Secondary | ICD-10-CM | POA: Diagnosis not present

## 2020-04-30 DIAGNOSIS — D638 Anemia in other chronic diseases classified elsewhere: Secondary | ICD-10-CM | POA: Diagnosis not present

## 2020-05-09 ENCOUNTER — Ambulatory Visit (INDEPENDENT_AMBULATORY_CARE_PROVIDER_SITE_OTHER): Payer: PPO | Admitting: Bariatrics

## 2020-05-09 ENCOUNTER — Encounter (INDEPENDENT_AMBULATORY_CARE_PROVIDER_SITE_OTHER): Payer: Self-pay | Admitting: Bariatrics

## 2020-05-09 ENCOUNTER — Other Ambulatory Visit: Payer: Self-pay

## 2020-05-09 VITALS — BP 133/80 | HR 77 | Temp 98.9°F | Ht 68.0 in | Wt 256.0 lb

## 2020-05-09 DIAGNOSIS — J449 Chronic obstructive pulmonary disease, unspecified: Secondary | ICD-10-CM | POA: Insufficient documentation

## 2020-05-09 DIAGNOSIS — E559 Vitamin D deficiency, unspecified: Secondary | ICD-10-CM

## 2020-05-09 DIAGNOSIS — I10 Essential (primary) hypertension: Secondary | ICD-10-CM

## 2020-05-09 DIAGNOSIS — R7303 Prediabetes: Secondary | ICD-10-CM | POA: Diagnosis not present

## 2020-05-09 DIAGNOSIS — Z6838 Body mass index (BMI) 38.0-38.9, adult: Secondary | ICD-10-CM | POA: Diagnosis not present

## 2020-05-09 HISTORY — DX: Chronic obstructive pulmonary disease, unspecified: J44.9

## 2020-05-10 ENCOUNTER — Ambulatory Visit: Payer: PPO | Admitting: Cardiology

## 2020-05-10 ENCOUNTER — Encounter: Payer: Self-pay | Admitting: Cardiology

## 2020-05-10 VITALS — BP 132/64 | HR 76 | Ht 68.0 in | Wt 263.4 lb

## 2020-05-10 DIAGNOSIS — G4733 Obstructive sleep apnea (adult) (pediatric): Secondary | ICD-10-CM | POA: Diagnosis not present

## 2020-05-10 DIAGNOSIS — I10 Essential (primary) hypertension: Secondary | ICD-10-CM | POA: Diagnosis not present

## 2020-05-10 NOTE — Progress Notes (Addendum)
Date:  05/10/2020   ID:  Richard [name], DOB [birth year], MRN [id number]  PCP:  Vincente Liberty, MD  Cardiologist:  Fransico Him, MD  Sleep Medicine:  Fransico Him, MD Electrophysiologist:  None   Chief Complaint:  OSA  History of Present Illness:    Richard [name] is a 75 y.o. male with a hx of Obesity, snoring and witnessed Apnea.  He was referred for sleep study which showed mild OSA with an AHI of 13.5/hr and O2 sats of 88% with events.  He underwent BiPAP titration to 13/9cm H2O.    He is doing well with his CPAP device and thinks that he has gotten used to it.  He tolerates the full face mask and feels the pressure is adequate.  He does not really notice a difference in how he feels in the am because he gets up several times at night to urinate due to diuretics but thinks he is sleeping better than he was before and does not have to nap during the day.  He denies any significant mouth or nasal dryness or nasal congestion except for allergies.  He does not think that he snores.    Prior CV studies:   The following studies were reviewed today:  PAP compliance download  Past Medical History:  Diagnosis Date  . Acid reflux disease   . Arthritis   . Asthma   . COPD (chronic obstructive pulmonary disease) ([place])   . Edema, lower extremity   . Heart failure, diastolic ([place])   . High cholesterol   . Hypertension   . Myocardial infarction [facility]) 2004   Dr. Tamala Julian, at [place], yearly visits  . OSA treated with BiPAP 04/24/2018   Mild OSA with an AHI of 13.5/hr and O2 sats of 88%. Now on BiPAP at 13/9cm H2O.   . Sleep apnea   . Vitamin D deficiency    Past Surgical History:  Procedure Laterality Date  . CARDIAC CATHETERIZATION  2004   with 2 stents  . CARDIOVASCULAR STRESS TEST  2011  . COLONOSCOPY    . POLYPECTOMY  02/17/2011   Procedure: POLYPECTOMY NASAL;  Surgeon: Delsa Bern;  Location: [place] OR;  Service: ENT;  Laterality: N/A;  . SINUS ENDO W/FUSION   02/17/2011   Procedure: ENDOSCOPIC SINUS SURGERY WITH FUSION NAVIGATION;  Surgeon: Delsa Bern;  Location: [facility] OR;  Service: ENT;  Laterality: N/A;  . TONSILLECTOMY    . US ECHOCARDIOGRAPHY       Current Meds  Medication Sig  . albuterol (PROVENTIL HFA;VENTOLIN HFA) 108 (90 Base) MCG/ACT inhaler Inhale into the lungs every 6 (six) hours as needed for wheezing or shortness of breath.  Marland Kitchen amLODipine (NORVASC) 5 MG tablet Take 5 mg by mouth daily.  Marland Kitchen aspirin EC 81 MG tablet Take 1 tablet (81 mg total) by mouth daily.  Marland Kitchen atorvastatin (LIPITOR) 80 MG tablet Take 1 tablet (80 mg total) by mouth daily.  . carvedilol (COREG) 12.5 MG tablet Take 1 tablet (12.5 mg total) by mouth 2 (two) times daily.  . chlorthalidone (HYGROTON) 50 MG tablet Take 50 mg by mouth daily.  Marland Kitchen guaiFENesin (MUCINEX) 600 MG 12 hr tablet Take by mouth 2 (two) times daily.  Marland Kitchen ipratropium-albuterol (DUONEB) 0.5-2.5 (3) MG/3ML SOLN Take 3 mLs by nebulization 3 (three) times daily. When feeling better change instructions to take every 6 hours if needed for wheezing or shortness of breath  . montelukast (SINGULAIR) 10 MG tablet Take 10 mg by mouth  at bedtime.  . nitroGLYCERIN (NITROSTAT) 0.4 MG SL tablet Place 1 tablet (0.4 mg total) under the tongue every 5 (five) minutes as needed. For chest pain  . omeprazole (PRILOSEC) 40 MG capsule Take 40 mg by mouth daily.  . potassium chloride (MICRO-K) 10 MEQ CR capsule Take 10 mEq by mouth 2 (two) times daily.  . Vitamin D, Ergocalciferol, (DRISDOL) 1.25 MG (50000 UNIT) CAPS capsule Take 50,000 Units by mouth once a week.     Allergies:   Beef-derived products, Chicken protein, Lisinopril, and Peanut-containing drug products   Social History   Tobacco Use  . Smoking status: Former Research scientist (life sciences)  . Smokeless tobacco: Never Used  Vaping Use  . Vaping Use: Never used  Substance Use Topics  . Alcohol use: Yes    Alcohol/week: 6.0 standard drinks    Types: 2 Cans of beer, 4 Standard  drinks or equivalent per week    Comment: 5-6 drinks/week  . Drug use: No     Family Hx: The patient's family history includes Alzheimer's disease in his mother; Cancer in his mother; Diabetes in his father and sister; Heart attack in his father; Hypertension in his father and sister; Stroke in his mother.  ROS:   Please see the history of present illness.     All other systems reviewed and are negative.   Labs/Other Tests and Data Reviewed:    Recent Labs: 04/08/2020: ALT 12; BUN 13; Creatinine, Ser 0.98; Potassium 4.0; Sodium 132; TSH 0.922   Recent Lipid Panel Lab Results  Component Value Date/Time   CHOL 144 04/08/2020 10:31 AM   TRIG 82 04/08/2020 10:31 AM   HDL 53 04/08/2020 10:31 AM   CHOLHDL 2.8 07/26/2019 11:42 AM   LDLCALC 75 04/08/2020 10:31 AM    Wt Readings from Last 3 Encounters:  05/10/20 263 lb 6.4 oz (119.5 kg)  04/22/20 260 lb (117.9 kg)  04/08/20 258 lb (117 kg)     Objective:    Vital Signs:  BP 132/64   Pulse 76   Ht 5\' 8"  (1.727 m)   Wt 263 lb 6.4 oz (119.5 kg)   SpO2 98%   BMI 40.05 kg/m    GEN: Well nourished, well developed in no acute distress HEENT: Normal NECK: No JVD; No carotid bruits LYMPHATICS: No lymphadenopathy CARDIAC:RRR, no murmurs, rubs, gallops RESPIRATORY:  Clear to auscultation without rales, wheezing or rhonchi  ABDOMEN: Soft, non-tender, non-distended MUSCULOSKELETAL:  Trace edema; No deformity  SKIN: Warm and dry NEUROLOGIC:  Alert and oriented x 3 PSYCHIATRIC:  Normal affect    ASSESSMENT & PLAN:    1.  OSA -  The patient is tolerating PAP therapy well without any problems. The PAP download was reviewed today and showed an AHI of 0.8/hr on 13/9 cm H2O with 83% compliance in using more than 4 hours nightly.  The patient has been using and benefiting from PAP use and will continue to benefit from therapy.   2.  HTN -BP controlled on exam today -continue amlodipine 5mg  daily, Carvedilol 12.5mg  BID and  Chlorthalidone 50mg  daily  3.  Morbid Obesity -I have encouraged him to get into a routine exercise program and cut back on carbs and portions.  -he just started the [facility] Well[facility] program  COVID-19 Education: The signs and symptoms of COVID-19 were discussed with the patient and how to seek care for testing (follow up with PCP or arrange E-visit).  The importance of social distancing was discussed today.  Patient  Risk:   After full review of this patient's clinical status, I feel that they are at least moderate risk at this time.  Time:   Today, I have spent 20 minutes on telemedicine discussing medical problems including OSA, HTN and Obesity and reviewing patient's chart including PAP compliance download from [place].  Medication Adjustments/Labs and Tests Ordered: Current medicines are reviewed at length with the patient today.  Concerns regarding medicines are outlined above.  Tests Ordered: No orders of the defined types were placed in this encounter.  Medication Changes: No orders of the defined types were placed in this encounter.   Disposition:  Follow up in 1 year(s)  Signed, Fransico Him, MD  05/10/2020 10:13 AM    [facility]

## 2020-05-10 NOTE — Patient Instructions (Signed)
Medication Instructions:  Your provider recommends that you continue on your current medications as directed. Please refer to the Current Medication list given to you today.   *If you need a refill on your cardiac medications before your next appointment, please call your pharmacy*   Follow-Up: At [facility], you and your health needs are our priority.  As part of our continuing mission to provide you with exceptional heart care, we have created designated Provider Care Teams.  These Care Teams include your primary Cardiologist (physician) and Advanced Practice Providers (APPs -  Physician Assistants and Nurse Practitioners) who all work together to provide you with the care you need, when you need it. Your next appointment:   12 month(s) The format for your next appointment:   In Person Provider:   Fransico Him, MD

## 2020-05-13 ENCOUNTER — Encounter (INDEPENDENT_AMBULATORY_CARE_PROVIDER_SITE_OTHER): Payer: Self-pay | Admitting: Bariatrics

## 2020-05-13 NOTE — Progress Notes (Signed)
Chief Complaint:   OBESITY Richard [name] is here to discuss his progress with his obesity treatment plan along with follow-up of his obesity related diagnoses. Richard [name] is on the Category 2 Plan and states he is following his eating plan approximately 100% of the time. Richard [name] states he is using the treadmill for 15-20 minutes 2 times per week.  Today's visit was #: 3 Starting weight: 258 lbs Starting date: 04/08/2020 Today's weight: 256 lbs Today's date: 05/09/2020 Total lbs lost to date: 2 lbs Total lbs lost since last in-office visit: 4 lbs  Interim History: Richard [name] is down 4 pounds since his last visit.  He has struggled with dinner.  Subjective:   1. Prediabetes Richard [name] has a diagnosis of prediabetes based on his elevated HgA1c and was informed this puts him at greater risk of developing diabetes. He continues to work on diet and exercise to decrease his risk of diabetes. He denies nausea or hypoglycemia.  Denies polyphagia.  Lab Results  Component Value Date   HGBA1C 6.2 (H) 04/08/2020   Lab Results  Component Value Date   INSULIN 5.3 04/08/2020   2. Essential hypertension Controlled.  Review: taking medications as instructed, no medication side effects noted, no chest pain on exertion, no dyspnea on exertion, no swelling of ankles.    BP Readings from Last 3 Encounters:  05/10/20 132/64  05/09/20 133/80  04/22/20 125/70   3. Vitamin D deficiency Richard [name]'s Vitamin D level was 60.0 on 04/08/2020. He is currently taking prescription vitamin D 50,000 IU each week. He denies nausea, vomiting or muscle weakness.  Assessment/Plan:   1. Prediabetes Richard [name] will continue to work on weight loss, exercise, and decreasing simple carbohydrates to help decrease the risk of diabetes.  Continue to work on diet and exercise.  2. Essential hypertension Richard [name] is working on healthy weight loss and exercise to improve blood pressure control. We will watch for signs of hypotension as he continues his  lifestyle modifications.  Continue medications.  3. Vitamin D deficiency Low Vitamin D level contributes to fatigue and are associated with obesity, breast, and colon cancer. He agrees to continue to take prescription Vitamin D @50 ,000 IU every week and will follow-up for routine testing of Vitamin D, at least 2-3 times per year to avoid over-replacement.  4. Class 2 severe obesity with serious comorbidity and body mass index (BMI) of 38.0 to 38.9 in adult, unspecified obesity type Richard [name])  Richard [name] is currently in the action stage of change. As such, his goal is to continue with weight loss efforts. He has agreed to the Category 2 Plan and keeping a food journal and adhering to recommended goals of 1200 calories and 80 grams of protein.   He will work on meal planning, intentional eating, and getting in calories and protein for breakfast, lunch, and dinner.  Exercise goals: Going to the gym for 20 minutes 3 times per week.   Behavioral modification strategies: increasing lean protein intake, decreasing simple carbohydrates, increasing vegetables, increasing water intake, decreasing eating out, no skipping meals, meal planning and cooking strategies, keeping healthy foods in the home and planning for success.  Richard [name] has agreed to follow-up with our clinic in 2 weeks with Richard Bathe, FNP or Richard Marble, NP. He was informed of the importance of frequent follow-up visits to maximize his success with intensive lifestyle modifications for his multiple health conditions.   Objective:   Blood pressure 133/80, pulse 77, temperature 98.9 F (37.2 C), SpO2 98 %. There  is no height or weight on file to calculate BMI.  General: Cooperative, alert, well developed, in no acute distress. HEENT: Conjunctivae and lids unremarkable. Cardiovascular: Regular rhythm.  Lungs: Normal work of breathing. Neurologic: No focal deficits.   Lab Results  Component Value Date   CREATININE 0.98 04/08/2020   BUN 13  04/08/2020   NA 132 (L) 04/08/2020   K 4.0 04/08/2020   CL 92 (L) 04/08/2020   CO2 24 04/08/2020   Lab Results  Component Value Date   ALT 12 04/08/2020   AST 25 04/08/2020   ALKPHOS 116 04/08/2020   BILITOT 0.5 04/08/2020   Lab Results  Component Value Date   HGBA1C 6.2 (H) 04/08/2020   HGBA1C 6.1 (H) 04/30/2013   Lab Results  Component Value Date   INSULIN 5.3 04/08/2020   Lab Results  Component Value Date   TSH 0.922 04/08/2020   Lab Results  Component Value Date   CHOL 144 04/08/2020   HDL 53 04/08/2020   LDLCALC 75 04/08/2020   TRIG 82 04/08/2020   CHOLHDL 2.8 07/26/2019   Lab Results  Component Value Date   WBC 5.3 05/18/2017   HGB 14.4 05/18/2017   HCT 43.2 05/18/2017   MCV 86 05/18/2017   PLT 248 05/18/2017   Obesity Behavioral Intervention:   Approximately 15 minutes were spent on the discussion below.  ASK: We discussed the diagnosis of obesity with Richard [name] today and Richard [name] agreed to give [place] permission to discuss obesity behavioral modification therapy today.  ASSESS: Richard [name] has the diagnosis of obesity and his BMI today is 38.9. [name] is in the action stage of change.   ADVISE: [name] was educated on the multiple health risks of obesity as well as the benefit of weight loss to improve his health. He was advised of the need for long term treatment and the importance of lifestyle modifications to improve his current health and to decrease his risk of future health problems.  AGREE: Multiple dietary modification options and treatment options were discussed and Richard [name] agreed to follow the recommendations documented in the above note.  ARRANGE: Richard [name] was educated on the importance of frequent visits to treat obesity as outlined per CMS and USPSTF guidelines and agreed to schedule his next follow up appointment today.  Attestation Statements:   Reviewed by clinician on day of visit: allergies, medications, problem list, medical history, surgical history,  family history, social history, and previous encounter notes.  I, Water quality scientist, CMA, am acting as Location manager for [employer], DO  I have reviewed the above documentation for accuracy and completeness, and I agree with the above. Jearld Lesch, DO

## 2020-05-14 ENCOUNTER — Telehealth: Payer: Self-pay | Admitting: *Deleted

## 2020-05-14 NOTE — Telephone Encounter (Signed)
Order placed to choice for a new mask

## 2020-05-14 NOTE — Telephone Encounter (Signed)
-----   Message from Theodoro Parma, RN sent at 05/10/2020 10:18 AM EST ----- Regarding: mask order Hey! Dr. Radford Pax would like a new mask ordered for this patient with his DME. Thanks!

## 2020-05-27 ENCOUNTER — Ambulatory Visit (INDEPENDENT_AMBULATORY_CARE_PROVIDER_SITE_OTHER): Payer: PPO | Admitting: Family Medicine

## 2020-06-03 ENCOUNTER — Ambulatory Visit (INDEPENDENT_AMBULATORY_CARE_PROVIDER_SITE_OTHER): Payer: PPO | Admitting: Family Medicine

## 2020-06-03 ENCOUNTER — Encounter (INDEPENDENT_AMBULATORY_CARE_PROVIDER_SITE_OTHER): Payer: Self-pay | Admitting: Family Medicine

## 2020-06-03 ENCOUNTER — Other Ambulatory Visit: Payer: Self-pay

## 2020-06-03 VITALS — BP 136/74 | HR 82 | Temp 98.5°F | Ht 68.0 in | Wt 251.0 lb

## 2020-06-03 DIAGNOSIS — I2510 Atherosclerotic heart disease of native coronary artery without angina pectoris: Secondary | ICD-10-CM

## 2020-06-03 DIAGNOSIS — Z6839 Body mass index (BMI) 39.0-39.9, adult: Secondary | ICD-10-CM | POA: Diagnosis not present

## 2020-06-04 DIAGNOSIS — E66812 Obesity, class 2: Secondary | ICD-10-CM

## 2020-06-04 HISTORY — DX: Morbid (severe) obesity due to excess calories: E66.01

## 2020-06-04 HISTORY — DX: Obesity, class 2: E66.812

## 2020-06-04 NOTE — Progress Notes (Signed)
Chief Complaint:   OBESITY Richard [name] is here to discuss his progress with his obesity treatment plan along with follow-up of his obesity related diagnoses. Richard [name] is on the Category 2 Plan and states he is following his eating plan approximately 98% of the time. Richard [name] states he is not exercising regularly.  Today's visit was #: 4 Starting weight: 258 lbs Starting date: 04/08/2020 Today's weight: 251 lbs Today's date: 06/03/2020 Total lbs lost to date: 7 lbs Total lbs lost since last in-office visit: 5 lbs  Interim History: Richard [name] notes the meal plan is okay, but he does miss some foods such as fried fish.  He feels hunger is satisfied.  He does not feel he will stick to journaling, so he has not been doing it and has instead continued with cat 2 pan.   Subjective:   1. 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  He follows with Cardiology (Dr. Daneen Schick).  Last office visit with Dr. Tamala Julian was on 12/13/2019.  Blood pressure well controlled.  BP Readings from Last 3 Encounters:  06/03/20 136/74  05/10/20 132/64  05/09/20 133/80   Assessment/Plan:   1. CAD in native artery Continue to follow-up with Cardiology.  2. Obesity: BMI 39  Richard [name] is currently in the action stage of change. As such, his goal is to continue with weight loss efforts. He has agreed to the Category 2 Plan.   Exercise goals: No exercise has been prescribed at this time.  Behavioral modification strategies: increasing lean protein intake and decreasing simple carbohydrates.  Richard [name] has agreed to follow-up with our clinic in 2 weeks.   Objective:   Blood pressure 136/74, pulse 82, temperature 98.5 F (36.9 C), height 5\' 8"  (1.727 m), weight 251 lb (113.9 kg), SpO2 98 %. Body mass index is 38.16 kg/m.  General: Cooperative, alert, well developed, in no acute distress. HEENT: Conjunctivae and lids unremarkable. Cardiovascular: Regular rhythm.  Lungs: Normal work of breathing. Neurologic: No focal  deficits.   Lab Results  Component Value Date   CREATININE 0.98 04/08/2020   BUN 13 04/08/2020   NA 132 (L) 04/08/2020   K 4.0 04/08/2020   CL 92 (L) 04/08/2020   CO2 24 04/08/2020   Lab Results  Component Value Date   ALT 12 04/08/2020   AST 25 04/08/2020   ALKPHOS 116 04/08/2020   BILITOT 0.5 04/08/2020   Lab Results  Component Value Date   HGBA1C 6.2 (H) 04/08/2020   HGBA1C 6.1 (H) 04/30/2013   Lab Results  Component Value Date   INSULIN 5.3 04/08/2020   Lab Results  Component Value Date   TSH 0.922 04/08/2020   Lab Results  Component Value Date   CHOL 144 04/08/2020   HDL 53 04/08/2020   LDLCALC 75 04/08/2020   TRIG 82 04/08/2020   CHOLHDL 2.8 07/26/2019   Lab Results  Component Value Date   WBC 5.3 05/18/2017   HGB 14.4 05/18/2017   HCT 43.2 05/18/2017   MCV 86 05/18/2017   PLT 248 05/18/2017   Obesity Behavioral Intervention:   Approximately 15 minutes were spent on the discussion below.  ASK: We discussed the diagnosis of obesity with Richard [name] today and Richard [name] agreed to give [place] permission to discuss obesity behavioral modification therapy today.  ASSESS: Richard [name] has the diagnosis of obesity and his BMI today is 38.2. Richard [name] is in the action stage of change.   ADVISE: Richard [name] was educated on the multiple health risks of  obesity as well as the benefit of weight loss to improve his health. He was advised of the need for long term treatment and the importance of lifestyle modifications to improve his current health and to decrease his risk of future health problems.  AGREE: Multiple dietary modification options and treatment options were discussed and Richard [name] agreed to follow the recommendations documented in the above note.  ARRANGE: Richard [name] was educated on the importance of frequent visits to treat obesity as outlined per CMS and USPSTF guidelines and agreed to schedule his next follow up appointment today.  Attestation Statements:   Reviewed by clinician  on day of visit: allergies, medications, problem list, medical history, surgical history, family history, social history, and previous encounter notes.  I, Water quality scientist, CMA, am acting as Location manager for [employer], [place].  I have reviewed the above documentation for accuracy and completeness, and I agree with the above. -  Georgianne Fick, FNP

## 2020-06-10 ENCOUNTER — Other Ambulatory Visit (HOSPITAL_COMMUNITY): Payer: Self-pay

## 2020-06-10 MED ORDER — ERGOCALCIFEROL 1.25 MG (50000 UT) PO CAPS
50000.0000 [IU] | ORAL_CAPSULE | ORAL | 5 refills | Status: DC
  Filled 2020-06-10: qty 4, 28d supply, fill #0
  Filled 2020-07-07: qty 4, 28d supply, fill #1
  Filled 2020-09-09: qty 4, 28d supply, fill #2
  Filled 2020-10-21: qty 4, 28d supply, fill #3
  Filled 2020-11-18: qty 4, 28d supply, fill #4
  Filled 2021-01-14: qty 4, 28d supply, fill #5

## 2020-06-10 MED FILL — Atorvastatin Calcium Tab 80 MG (Base Equivalent): ORAL | 90 days supply | Qty: 90 | Fill #0 | Status: AC

## 2020-06-10 MED FILL — Potassium Chloride Cap ER 10 mEq: ORAL | 30 days supply | Qty: 30 | Fill #0 | Status: AC

## 2020-06-11 ENCOUNTER — Other Ambulatory Visit (HOSPITAL_COMMUNITY): Payer: Self-pay

## 2020-06-19 ENCOUNTER — Ambulatory Visit (INDEPENDENT_AMBULATORY_CARE_PROVIDER_SITE_OTHER): Payer: PPO | Admitting: Bariatrics

## 2020-06-26 MED FILL — Ipratropium-Albuterol Nebu Soln 0.5-2.5(3) MG/3ML: RESPIRATORY_TRACT | 30 days supply | Qty: 270 | Fill #0 | Status: AC

## 2020-06-27 ENCOUNTER — Other Ambulatory Visit (HOSPITAL_COMMUNITY): Payer: Self-pay

## 2020-07-05 ENCOUNTER — Other Ambulatory Visit (HOSPITAL_COMMUNITY): Payer: Self-pay

## 2020-07-07 ENCOUNTER — Other Ambulatory Visit: Payer: Self-pay | Admitting: Interventional Cardiology

## 2020-07-07 MED FILL — Montelukast Sodium Tab 10 MG (Base Equiv): ORAL | 90 days supply | Qty: 90 | Fill #0 | Status: AC

## 2020-07-07 MED FILL — Amlodipine Besylate Tab 5 MG (Base Equivalent): ORAL | 90 days supply | Qty: 90 | Fill #0 | Status: AC

## 2020-07-08 ENCOUNTER — Encounter (INDEPENDENT_AMBULATORY_CARE_PROVIDER_SITE_OTHER): Payer: Self-pay | Admitting: Bariatrics

## 2020-07-08 ENCOUNTER — Other Ambulatory Visit (HOSPITAL_COMMUNITY): Payer: Self-pay

## 2020-07-08 ENCOUNTER — Ambulatory Visit (INDEPENDENT_AMBULATORY_CARE_PROVIDER_SITE_OTHER): Payer: PPO | Admitting: Bariatrics

## 2020-07-08 ENCOUNTER — Other Ambulatory Visit: Payer: Self-pay

## 2020-07-08 VITALS — BP 146/84 | HR 93 | Temp 98.3°F | Ht 68.0 in | Wt 243.0 lb

## 2020-07-08 DIAGNOSIS — E7849 Other hyperlipidemia: Secondary | ICD-10-CM | POA: Diagnosis not present

## 2020-07-08 DIAGNOSIS — I10 Essential (primary) hypertension: Secondary | ICD-10-CM

## 2020-07-08 DIAGNOSIS — Z6839 Body mass index (BMI) 39.0-39.9, adult: Secondary | ICD-10-CM

## 2020-07-08 DIAGNOSIS — E66812 Obesity, class 2: Secondary | ICD-10-CM

## 2020-07-08 MED ORDER — CARVEDILOL 12.5 MG PO TABS
12.5000 mg | ORAL_TABLET | Freq: Two times a day (BID) | ORAL | 3 refills | Status: DC
  Filled 2020-07-08: qty 90, 45d supply, fill #0
  Filled 2020-08-19: qty 90, 45d supply, fill #1
  Filled 2020-10-06: qty 90, 45d supply, fill #2
  Filled 2020-11-18: qty 90, 45d supply, fill #3

## 2020-07-08 MED FILL — Potassium Chloride Cap ER 10 mEq: ORAL | 30 days supply | Qty: 30 | Fill #1 | Status: AC

## 2020-07-09 ENCOUNTER — Other Ambulatory Visit (HOSPITAL_COMMUNITY): Payer: Self-pay

## 2020-07-09 NOTE — Progress Notes (Signed)
Chief Complaint:   OBESITY Richard [name] is here to discuss his progress with his obesity treatment plan along with follow-up of his obesity related diagnoses. Richard [name] is on the Category 2 Plan and states he is following his eating plan approximately 95% of the time. Richard [name] states he is doing 0 minutes 0 times per week.  Today's visit was #: 5 Starting weight: 258 lbs Starting date: 04/08/2020 Today's weight: 243 lbs Today's date: 07/08/2020 Total lbs lost to date: 15 Total lbs lost since last in-office visit: 8  Interim History: Richard [name] is down 8 lbs since his last visit. He is doing well with his protein and water intake.  Subjective:   1. Essential hypertension Richard [name]'s blood pressure is reasonably well controlled.  2. Other hyperlipidemia Richard [name] is taking Lipitor and Zetia.  Assessment/Plan:   1. Essential hypertension Richard [name] will continue his medications, and will continue working on healthy weight loss and exercise to improve blood pressure control. We will watch for signs of hypotension as he continues his lifestyle modifications.  2. Other hyperlipidemia Cardiovascular risk and specific lipid/LDL goals reviewed. We discussed several lifestyle modifications today. Richard [name] will continue Lipitor and Zetia, and will continue to work on diet, exercise and weight loss efforts. Orders and follow up as documented in patient record.   Counseling Intensive lifestyle modifications are the first line treatment for this issue. . Dietary changes: Increase soluble fiber. Decrease simple carbohydrates. . Exercise changes: Moderate to vigorous-intensity aerobic activity 150 minutes per week if tolerated. . Lipid-lowering medications: see documented in medical record.  3. Obesity: BMI 29 Richard [name] is currently in the action stage of change. As such, his goal is to continue with weight loss efforts. He has [name] to the Category 2 Plan.   Richard [name] will stay adherent to the meal plan. Recipes handout was  given, and we discussed mindful eating. We will recheck fasting labs at his next visit.  Exercise goals: Exercise 3 times per week.  Behavioral modification strategies: increasing lean protein intake, decreasing simple carbohydrates, increasing vegetables, increasing water intake, decreasing eating out, no skipping meals, meal planning and cooking strategies, keeping healthy foods in the home and planning for success.  Richard [name] has [name] to follow-up with our clinic in 2 to 3 weeks. He was informed of the importance of frequent follow-up visits to maximize his success with intensive lifestyle modifications for his multiple health conditions.   Objective:   Blood pressure (!) 146/84, pulse 93, temperature 98.3 F (36.8 C), height 5\' 8"  (1.727 m), weight 243 lb (110.2 kg), SpO2 98 %. Body mass index is 36.95 kg/m.  General: Cooperative, alert, well developed, in no acute distress. HEENT: Conjunctivae and lids unremarkable. Cardiovascular: Regular rhythm.  Lungs: Normal work of breathing. Neurologic: No focal deficits.   Lab Results  Component Value Date   CREATININE 0.98 04/08/2020   BUN 13 04/08/2020   NA 132 (L) 04/08/2020   K 4.0 04/08/2020   CL 92 (L) 04/08/2020   CO2 24 04/08/2020   Lab Results  Component Value Date   ALT 12 04/08/2020   AST 25 04/08/2020   ALKPHOS 116 04/08/2020   BILITOT 0.5 04/08/2020   Lab Results  Component Value Date   HGBA1C 6.2 (H) 04/08/2020   HGBA1C 6.1 (H) 04/30/2013   Lab Results  Component Value Date   INSULIN 5.3 04/08/2020   Lab Results  Component Value Date   TSH 0.922 04/08/2020   Lab Results  Component Value Date   CHOL  144 04/08/2020   HDL 53 04/08/2020   LDLCALC 75 04/08/2020   TRIG 82 04/08/2020   CHOLHDL 2.8 07/26/2019   Lab Results  Component Value Date   WBC 5.3 05/18/2017   HGB 14.4 05/18/2017   HCT 43.2 05/18/2017   MCV 86 05/18/2017   PLT 248 05/18/2017   No results found for: IRON, TIBC,  FERRITIN  Obesity Behavioral Intervention:   Approximately 15 minutes were spent on the discussion below.  ASK: We discussed the diagnosis of obesity with Richard [name] today and Richard [name] to give [place] permission to discuss obesity behavioral modification therapy today.  ASSESS: Richard [name] has the diagnosis of obesity and his BMI today is 36.96. Richard [name] is in the action stage of change.   ADVISE: Richard [name] was educated on the multiple health risks of obesity as well as the benefit of weight loss to improve his health. He was advised of the need for long term treatment and the importance of lifestyle modifications to improve his current health and to decrease his risk of future health problems.  AGREE: Multiple dietary modification options and treatment options were discussed and Richard [name] [name] to follow the recommendations documented in the above note.  ARRANGE: Richard [name] was educated on the importance of frequent visits to treat obesity as outlined per CMS and USPSTF guidelines and [name] to schedule his next follow up appointment today.  Attestation Statements:   Reviewed by clinician on day of visit: allergies, medications, problem list, medical history, surgical history, family history, social history, and previous encounter notes.   Wilhemena Durie, am acting as Location manager for [employer], DO.  I have reviewed the above documentation for accuracy and completeness, and I agree with the above. Jearld Lesch, DO

## 2020-07-10 ENCOUNTER — Encounter (INDEPENDENT_AMBULATORY_CARE_PROVIDER_SITE_OTHER): Payer: Self-pay | Admitting: Bariatrics

## 2020-07-16 ENCOUNTER — Other Ambulatory Visit (HOSPITAL_COMMUNITY): Payer: Self-pay

## 2020-07-16 MED ORDER — CHLORTHALIDONE 50 MG PO TABS
50.0000 mg | ORAL_TABLET | Freq: Every day | ORAL | 4 refills | Status: DC
  Filled 2020-07-16: qty 90, 90d supply, fill #0
  Filled 2020-10-06: qty 90, 90d supply, fill #1
  Filled 2021-01-14: qty 90, 90d supply, fill #2

## 2020-07-18 DIAGNOSIS — G4733 Obstructive sleep apnea (adult) (pediatric): Secondary | ICD-10-CM | POA: Diagnosis not present

## 2020-07-24 ENCOUNTER — Other Ambulatory Visit: Payer: Self-pay

## 2020-07-24 ENCOUNTER — Ambulatory Visit (INDEPENDENT_AMBULATORY_CARE_PROVIDER_SITE_OTHER): Payer: PPO | Admitting: Bariatrics

## 2020-07-24 ENCOUNTER — Encounter (INDEPENDENT_AMBULATORY_CARE_PROVIDER_SITE_OTHER): Payer: Self-pay | Admitting: Bariatrics

## 2020-07-24 VITALS — BP 119/71 | HR 75 | Temp 98.4°F | Ht 68.0 in | Wt 243.0 lb

## 2020-07-24 DIAGNOSIS — Z6839 Body mass index (BMI) 39.0-39.9, adult: Secondary | ICD-10-CM | POA: Diagnosis not present

## 2020-07-24 DIAGNOSIS — E559 Vitamin D deficiency, unspecified: Secondary | ICD-10-CM | POA: Diagnosis not present

## 2020-07-24 DIAGNOSIS — R7303 Prediabetes: Secondary | ICD-10-CM

## 2020-07-24 DIAGNOSIS — E7849 Other hyperlipidemia: Secondary | ICD-10-CM

## 2020-07-24 DIAGNOSIS — I10 Essential (primary) hypertension: Secondary | ICD-10-CM

## 2020-07-25 LAB — COMPREHENSIVE METABOLIC PANEL
ALT: 15 IU/L (ref 0–44)
AST: 27 IU/L (ref 0–40)
Albumin/Globulin Ratio: 2 (ref 1.2–2.2)
Albumin: 4.2 g/dL (ref 3.7–4.7)
Alkaline Phosphatase: 103 IU/L (ref 44–121)
BUN/Creatinine Ratio: 17 (ref 10–24)
BUN: 14 mg/dL (ref 8–27)
Bilirubin Total: 0.5 mg/dL (ref 0.0–1.2)
CO2: 22 mmol/L (ref 20–29)
Calcium: 8.9 mg/dL (ref 8.6–10.2)
Chloride: 91 mmol/L — ABNORMAL LOW (ref 96–106)
Creatinine, Ser: 0.84 mg/dL (ref 0.76–1.27)
Globulin, Total: 2.1 g/dL (ref 1.5–4.5)
Glucose: 98 mg/dL (ref 65–99)
Potassium: 3.5 mmol/L (ref 3.5–5.2)
Sodium: 131 mmol/L — ABNORMAL LOW (ref 134–144)
Total Protein: 6.3 g/dL (ref 6.0–8.5)
eGFR: 92 mL/min/{1.73_m2} (ref >59)

## 2020-07-25 LAB — VITAMIN D 25 HYDROXY (VIT D DEFICIENCY, FRACTURES): Vit D, 25-Hydroxy: 53.6 ng/mL (ref 30.0–100.0)

## 2020-07-25 LAB — LIPID PANEL WITH LDL/HDL RATIO
Cholesterol, Total: 137 mg/dL (ref 100–199)
HDL: 61 mg/dL (ref >39)
LDL Chol Calc (NIH): 62 mg/dL (ref 0–99)
LDL/HDL Ratio: 1 ratio (ref 0.0–3.6)
Triglycerides: 69 mg/dL (ref 0–149)
VLDL Cholesterol Cal: 14 mg/dL (ref 5–40)

## 2020-07-25 LAB — HEMOGLOBIN A1C
Est. average glucose Bld gHb Est-mCnc: 134 mg/dL
Hgb A1c MFr Bld: 6.3 % — ABNORMAL HIGH (ref 4.8–5.6)

## 2020-07-25 LAB — INSULIN, RANDOM: INSULIN: 12.3 u[IU]/mL (ref 2.6–24.9)

## 2020-07-25 NOTE — Progress Notes (Signed)
Chief Complaint:   OBESITY Richard [name] is here to discuss his progress with his obesity treatment plan along with follow-up of his obesity related diagnoses. Richard [name] is on the Category 2 Plan and states he is following his eating plan approximately 90% of the time. Richard [name] states he is not currently exercising.  Today's visit was #: 6 Starting weight: 258 lbs Starting date: 04/08/2020 Today's weight: 243 lbs Today's date: 07/24/2020 Total lbs lost to date: 15 lbs Total lbs lost since last in-office visit: 0  Interim History: Richard [name]' weight remains the same since his last visit. He is doing well with protein and water.  Subjective:   1. Essential hypertension Richard [name] is taking Hygroton and Coreg. His BP is controlled.  2. Pre-diabetes Richard [name] is not taking medication.  3. Other hyperlipidemia Richard [name] is taking Lipitor and Zetia.  4. Vitamin D deficiency Richard [name] is taking OTC Vit D.  Assessment/Plan:   1. Essential hypertension Richard [name] is working on healthy weight loss and exercise to improve blood pressure control. We will watch for signs of hypotension as he continues his lifestyle modifications. Check labs today.  - Comprehensive metabolic panel  2. Pre-diabetes Richard [name] will continue to work on weight loss, exercise, and decreasing simple carbohydrates to help decrease the risk of diabetes. Check labs today.  - Hemoglobin A1c - Insulin, random  3. Other hyperlipidemia Cardiovascular risk and specific lipid/LDL goals reviewed.  We discussed several lifestyle modifications today and Richard [name] will continue to work on diet, exercise and weight loss efforts. Orders and follow up as documented in patient record. Check labs today.  Counseling Intensive lifestyle modifications are the first line treatment for this issue. . Dietary changes: Increase soluble fiber. Decrease simple carbohydrates. . Exercise changes: Moderate to vigorous-intensity aerobic activity 150 minutes per week if  tolerated. . Lipid-lowering medications: see documented in medical record.  - Lipid Panel With LDL/HDL Ratio  4. Vitamin D deficiency Low Vitamin D level contributes to fatigue and are associated with obesity, breast, and colon cancer. He agrees to continue to take OTC Vitamin D and will follow-up for routine testing of Vitamin D, at least 2-3 times per year to avoid over-replacement. Check labs today.  - VITAMIN D 25 Hydroxy (Vit-D Deficiency, Fractures)  5. Obesity: BMI 65 Richard [name] is currently in the action stage of change. As such, his goal is to continue with weight loss efforts. He has agreed to the Category 2 Plan.   Meal plan Intentional eating Will adhere closely to plan  Exercise goals: As is- Need to increase golf and going to the gym.  Behavioral modification strategies: increasing lean protein intake, decreasing simple carbohydrates, increasing vegetables, increasing water intake, decreasing eating out, no skipping meals, meal planning and cooking strategies, keeping healthy foods in the home and planning for success.  Richard [name] has agreed to follow-up with our clinic in 2 weeks. He was informed of the importance of frequent follow-up visits to maximize his success with intensive lifestyle modifications for his multiple health conditions.   Objective:   Blood pressure 119/71, pulse 75, temperature 98.4 F (36.9 C), height 5\' 8"  (1.727 m), weight 243 lb (110.2 kg), SpO2 99 %. Body mass index is 36.95 kg/m.  General: Cooperative, alert, well developed, in no acute distress. HEENT: Conjunctivae and lids unremarkable. Cardiovascular: Regular rhythm.  Lungs: Normal work of breathing. Neurologic: No focal deficits.   Lab Results  Component Value Date   CREATININE 0.84 07/24/2020   BUN 14 07/24/2020   NA 131 (  L) 07/24/2020   K 3.5 07/24/2020   CL 91 (L) 07/24/2020   CO2 22 07/24/2020   Lab Results  Component Value Date   ALT 15 07/24/2020   AST 27 07/24/2020   ALKPHOS  103 07/24/2020   BILITOT 0.5 07/24/2020   Lab Results  Component Value Date   HGBA1C 6.3 (H) 07/24/2020   HGBA1C 6.2 (H) 04/08/2020   HGBA1C 6.1 (H) 04/30/2013   Lab Results  Component Value Date   INSULIN 12.3 07/24/2020   INSULIN 5.3 04/08/2020   Lab Results  Component Value Date   TSH 0.922 04/08/2020   Lab Results  Component Value Date   CHOL 137 07/24/2020   HDL 61 07/24/2020   LDLCALC 62 07/24/2020   TRIG 69 07/24/2020   CHOLHDL 2.8 07/26/2019   Lab Results  Component Value Date   WBC 5.3 05/18/2017   HGB 14.4 05/18/2017   HCT 43.2 05/18/2017   MCV 86 05/18/2017   PLT 248 05/18/2017   No results found for: IRON, TIBC, FERRITIN  Obesity Behavioral Intervention:   Approximately 15 minutes were spent on the discussion below.  ASK: We discussed the diagnosis of obesity with Richard [name] today and Richard [name] agreed to give [place] permission to discuss obesity behavioral modification therapy today.  ASSESS: Richard [name] has the diagnosis of obesity and his BMI today is 37.0. Richard [name] is in the action stage of change.   ADVISE: Richard [name] was educated on the multiple health risks of obesity as well as the benefit of weight loss to improve his health. He was advised of the need for long term treatment and the importance of lifestyle modifications to improve his current health and to decrease his risk of future health problems.  AGREE: Multiple dietary modification options and treatment options were discussed and Richard [name] agreed to follow the recommendations documented in the above note.  ARRANGE: Richard [name] was educated on the importance of frequent visits to treat obesity as outlined per CMS and USPSTF guidelines and agreed to schedule his next follow up appointment today.  Attestation Statements:   Reviewed by clinician on day of visit: allergies, medications, problem list, medical history, surgical history, family history, social history, and previous encounter notes.  Coral Ceo, CMA,  am acting as Location manager for [employer], DO.  I have reviewed the above documentation for accuracy and completeness, and I agree with the above. Jearld Lesch, DO

## 2020-07-27 ENCOUNTER — Other Ambulatory Visit (HOSPITAL_COMMUNITY): Payer: Self-pay

## 2020-07-27 MED FILL — Tamsulosin HCl Cap 0.4 MG: ORAL | 90 days supply | Qty: 90 | Fill #0 | Status: AC

## 2020-07-27 MED FILL — Ezetimibe Tab 10 MG: ORAL | 90 days supply | Qty: 90 | Fill #0 | Status: AC

## 2020-07-27 MED FILL — Omeprazole Cap Delayed Release 40 MG: ORAL | 90 days supply | Qty: 180 | Fill #0 | Status: AC

## 2020-07-29 ENCOUNTER — Other Ambulatory Visit (HOSPITAL_COMMUNITY): Payer: Self-pay

## 2020-07-29 ENCOUNTER — Encounter (INDEPENDENT_AMBULATORY_CARE_PROVIDER_SITE_OTHER): Payer: Self-pay | Admitting: Bariatrics

## 2020-07-30 ENCOUNTER — Other Ambulatory Visit (HOSPITAL_COMMUNITY): Payer: Self-pay

## 2020-07-30 MED ORDER — BREO ELLIPTA 200-25 MCG/INH IN AEPB
1.0000 | INHALATION_SPRAY | Freq: Every day | RESPIRATORY_TRACT | 5 refills | Status: DC
  Filled 2020-07-30: qty 180, 90d supply, fill #0
  Filled 2020-11-18: qty 180, 90d supply, fill #1

## 2020-08-13 ENCOUNTER — Other Ambulatory Visit (HOSPITAL_COMMUNITY): Payer: Self-pay

## 2020-08-13 MED ORDER — POTASSIUM CHLORIDE ER 10 MEQ PO CPCR
10.0000 meq | ORAL_CAPSULE | Freq: Every day | ORAL | 4 refills | Status: DC
  Filled 2020-08-13: qty 30, 30d supply, fill #0
  Filled 2020-09-11: qty 30, 30d supply, fill #1
  Filled 2020-10-10: qty 30, 30d supply, fill #2
  Filled 2020-11-10: qty 30, 30d supply, fill #3
  Filled 2020-12-08: qty 30, 30d supply, fill #4

## 2020-08-13 MED FILL — Ipratropium-Albuterol Nebu Soln 0.5-2.5(3) MG/3ML: RESPIRATORY_TRACT | 30 days supply | Qty: 180 | Fill #1 | Status: AC

## 2020-08-14 ENCOUNTER — Other Ambulatory Visit: Payer: Self-pay

## 2020-08-14 ENCOUNTER — Encounter (INDEPENDENT_AMBULATORY_CARE_PROVIDER_SITE_OTHER): Payer: Self-pay | Admitting: Bariatrics

## 2020-08-14 ENCOUNTER — Ambulatory Visit (INDEPENDENT_AMBULATORY_CARE_PROVIDER_SITE_OTHER): Payer: PPO | Admitting: Bariatrics

## 2020-08-14 ENCOUNTER — Other Ambulatory Visit (HOSPITAL_COMMUNITY): Payer: Self-pay

## 2020-08-14 VITALS — BP 126/67 | HR 73 | Temp 98.0°F | Ht 68.0 in | Wt 244.0 lb

## 2020-08-14 DIAGNOSIS — I10 Essential (primary) hypertension: Secondary | ICD-10-CM | POA: Diagnosis not present

## 2020-08-14 DIAGNOSIS — Z6839 Body mass index (BMI) 39.0-39.9, adult: Secondary | ICD-10-CM

## 2020-08-14 DIAGNOSIS — R7303 Prediabetes: Secondary | ICD-10-CM | POA: Diagnosis not present

## 2020-08-20 ENCOUNTER — Encounter (INDEPENDENT_AMBULATORY_CARE_PROVIDER_SITE_OTHER): Payer: Self-pay | Admitting: Bariatrics

## 2020-08-20 ENCOUNTER — Other Ambulatory Visit (HOSPITAL_COMMUNITY): Payer: Self-pay

## 2020-08-20 NOTE — Progress Notes (Signed)
Chief Complaint:   OBESITY Richard [name] is here to discuss his progress with his obesity treatment plan along with follow-up of his obesity related diagnoses. Richard [name] is on the Category 2 Plan and states he is following his eating plan approximately 85% of the time. Richard [name] states he is not currently exercising.   Today's visit was #: 7 Starting weight: 258 lbs Starting date: 04/08/2020 Today's weight: 244 lbs Today's date: 08/14/2020 Total lbs lost to date: 14 Total lbs lost since last in-office visit: 0  Interim History: Richard [name] is up 1 lb since his last visit. He got off the plan and celebrated some. He is ready to get back on track.  Subjective:   1. Essential hypertension Richard [name] is taking Norvasc and chlorthalidone.  2. Pre-diabetes Richard [name] is not taking medication. His last A1c was 6.3.  Assessment/Plan:   1. Essential hypertension Richard [name] is working on healthy weight loss and exercise to improve blood pressure control. We will watch for signs of hypotension as he continues his lifestyle modifications. Continue current treatment plan.  2. Pre-diabetes Richard [name] will continue to work on weight loss, exercise, and decreasing simple carbohydrates to help decrease the risk of diabetes. Continue current treatment plan.   3. Obesity with current BMI of 37.1  Richard [name] is currently in the action stage of change. As such, his goal is to continue with weight loss efforts. He has agreed to the Category 2 Plan.   Meal plan Intentional eating 07/24/2020 reviewed with pt. Increase water intake.  Exercise goals:  Pt has some SOB with some activities- he will increase activity as tolerated.  Behavioral modification strategies: increasing lean protein intake, decreasing simple carbohydrates, increasing vegetables, increasing water intake, decreasing eating out, no skipping meals, meal planning and cooking strategies, keeping healthy foods in the home, and planning for success.  Richard [name] has agreed to  follow-up with our clinic in 2-3 weeks. He was informed of the importance of frequent follow-up visits to maximize his success with intensive lifestyle modifications for his multiple health conditions.   Objective:   Blood pressure 126/67, pulse 73, temperature 98 F (36.7 C), height 5\' 8"  (1.727 m), weight 244 lb (110.7 kg), SpO2 97 %. Body mass index is 37.1 kg/m.  General: Cooperative, alert, well developed, in no acute distress. HEENT: Conjunctivae and lids unremarkable. Cardiovascular: Regular rhythm.  Lungs: Normal work of breathing. Neurologic: No focal deficits.   Lab Results  Component Value Date   CREATININE 0.84 07/24/2020   BUN 14 07/24/2020   NA 131 (L) 07/24/2020   K 3.5 07/24/2020   CL 91 (L) 07/24/2020   CO2 22 07/24/2020   Lab Results  Component Value Date   ALT 15 07/24/2020   AST 27 07/24/2020   ALKPHOS 103 07/24/2020   BILITOT 0.5 07/24/2020   Lab Results  Component Value Date   HGBA1C 6.3 (H) 07/24/2020   HGBA1C 6.2 (H) 04/08/2020   HGBA1C 6.1 (H) 04/30/2013   Lab Results  Component Value Date   INSULIN 12.3 07/24/2020   INSULIN 5.3 04/08/2020   Lab Results  Component Value Date   TSH 0.922 04/08/2020   Lab Results  Component Value Date   CHOL 137 07/24/2020   HDL 61 07/24/2020   LDLCALC 62 07/24/2020   TRIG 69 07/24/2020   CHOLHDL 2.8 07/26/2019   Lab Results  Component Value Date   WBC 5.3 05/18/2017   HGB 14.4 05/18/2017   HCT 43.2 05/18/2017   MCV 86 05/18/2017  PLT 248 05/18/2017   No results found for: IRON, TIBC, FERRITIN  Obesity Behavioral Intervention:   Approximately 15 minutes were spent on the discussion below.  ASK: We discussed the diagnosis of obesity with Richard [name] today and Richard [name] agreed to give [place] permission to discuss obesity behavioral modification therapy today.  ASSESS: Richard [name] has the diagnosis of obesity and his BMI today is 37.1. Richard [name] is in the action stage of change.   ADVISE: Richard [name] was educated on  the multiple health risks of obesity as well as the benefit of weight loss to improve his health. He was advised of the need for long term treatment and the importance of lifestyle modifications to improve his current health and to decrease his risk of future health problems.  AGREE: Multiple dietary modification options and treatment options were discussed and Richard [name] agreed to follow the recommendations documented in the above note.  ARRANGE: Richard [name] was educated on the importance of frequent visits to treat obesity as outlined per CMS and USPSTF guidelines and agreed to schedule his next follow up appointment today.  Attestation Statements:   Reviewed by clinician on day of visit: allergies, medications, problem list, medical history, surgical history, family history, social history, and previous encounter notes.  Coral Ceo, CMA, am acting as Location manager for [employer], DO.  I have reviewed the above documentation for accuracy and completeness, and I agree with the above. Jearld Lesch, DO

## 2020-09-04 ENCOUNTER — Ambulatory Visit (INDEPENDENT_AMBULATORY_CARE_PROVIDER_SITE_OTHER): Payer: PPO | Admitting: Bariatrics

## 2020-09-04 ENCOUNTER — Encounter (INDEPENDENT_AMBULATORY_CARE_PROVIDER_SITE_OTHER): Payer: Self-pay | Admitting: Bariatrics

## 2020-09-04 ENCOUNTER — Other Ambulatory Visit: Payer: Self-pay

## 2020-09-04 VITALS — BP 135/76 | HR 81 | Temp 98.4°F | Ht 68.0 in | Wt 242.0 lb

## 2020-09-04 DIAGNOSIS — E7849 Other hyperlipidemia: Secondary | ICD-10-CM

## 2020-09-04 DIAGNOSIS — Z6839 Body mass index (BMI) 39.0-39.9, adult: Secondary | ICD-10-CM | POA: Diagnosis not present

## 2020-09-04 DIAGNOSIS — I10 Essential (primary) hypertension: Secondary | ICD-10-CM

## 2020-09-09 MED FILL — Atorvastatin Calcium Tab 80 MG (Base Equivalent): ORAL | 90 days supply | Qty: 90 | Fill #1 | Status: AC

## 2020-09-10 ENCOUNTER — Encounter (INDEPENDENT_AMBULATORY_CARE_PROVIDER_SITE_OTHER): Payer: Self-pay | Admitting: Bariatrics

## 2020-09-10 ENCOUNTER — Other Ambulatory Visit (HOSPITAL_COMMUNITY): Payer: Self-pay

## 2020-09-10 MED FILL — Ipratropium-Albuterol Nebu Soln 0.5-2.5(3) MG/3ML: RESPIRATORY_TRACT | 30 days supply | Qty: 180 | Fill #2 | Status: AC

## 2020-09-10 NOTE — Progress Notes (Signed)
Chief Complaint:   OBESITY Richard [name] is here to discuss his progress with his obesity treatment plan along with follow-up of his obesity related diagnoses. Richard [name] is on the Category 2 Plan and states he is following his eating plan approximately 85% of the time. Richard [name] states he is not currently exercising.  Today's visit was #: 8 Starting weight: 258 lbs Starting date: 04/08/2020 Today's weight: 242 lbs Today's date: 09/04/2020 Total lbs lost to date: 16 Total lbs lost since last in-office visit: 2  Interim History: Richard [name] is down an additional 2 lbs. His appetite is normal and he denies excessive cravings.  Subjective:   1. Essential hypertension Pt's BP is controlled.  BP Readings from Last 3 Encounters:  09/04/20 135/76  08/14/20 126/67  07/24/20 119/71   2. Other hyperlipidemia Richard [name] is taking Lipitor.  Lab Results  Component Value Date   ALT 15 07/24/2020   AST 27 07/24/2020   ALKPHOS 103 07/24/2020   BILITOT 0.5 07/24/2020   Lab Results  Component Value Date   CHOL 137 07/24/2020   HDL 61 07/24/2020   LDLCALC 62 07/24/2020   TRIG 69 07/24/2020   CHOLHDL 2.8 07/26/2019   Assessment/Plan:   1. Essential hypertension [name] is working on healthy weight loss and exercise to improve blood pressure control. We will watch for signs of hypotension as he continues his lifestyle modifications. Continue current treatment plan.  2. Other hyperlipidemia Cardiovascular risk and specific lipid/LDL goals reviewed.  We discussed several lifestyle modifications today and [name] will continue to work on diet, exercise and weight loss efforts. Orders and follow up as documented in patient record.   Counseling Intensive lifestyle modifications are the first line treatment for this issue. Dietary changes: Increase soluble fiber. Decrease simple carbohydrates. Exercise changes: Moderate to vigorous-intensity aerobic activity 150 minutes per week if tolerated. Lipid-lowering  medications: see documented in medical record.  3. Obesity with current BMI of 36.6  Richard [name] is currently in the action stage of change. As such, his goal is to continue with weight loss efforts. He has agreed to the Category 2 Plan.   Meal plan Mindful eating  Exercise goals: Decreased his activity but will now consider going back tot he gym.  Behavioral modification strategies: increasing lean protein intake, decreasing simple carbohydrates, increasing vegetables, increasing water intake, decreasing eating out, no skipping meals, meal planning and cooking strategies, keeping healthy foods in the home, and planning for success.  Richard [name] has agreed to follow-up with our clinic in 3 weeks. He was informed of the importance of frequent follow-up visits to maximize his success with intensive lifestyle modifications for his multiple health conditions.   Objective:   Blood pressure 135/76, pulse 81, temperature 98.4 F (36.9 C), height 5\' 8"  (1.727 m), weight 242 lb (109.8 kg), SpO2 97 %. Body mass index is 36.8 kg/m.  General: Cooperative, alert, well developed, in no acute distress. HEENT: Conjunctivae and lids unremarkable. Cardiovascular: Regular rhythm.  Lungs: Normal work of breathing. Neurologic: No focal deficits.   Lab Results  Component Value Date   CREATININE 0.84 07/24/2020   BUN 14 07/24/2020   NA 131 (L) 07/24/2020   K 3.5 07/24/2020   CL 91 (L) 07/24/2020   CO2 22 07/24/2020   Lab Results  Component Value Date   ALT 15 07/24/2020   AST 27 07/24/2020   ALKPHOS 103 07/24/2020   BILITOT 0.5 07/24/2020   Lab Results  Component Value Date   HGBA1C 6.3 (H) 07/24/2020  HGBA1C 6.2 (H) 04/08/2020   HGBA1C 6.1 (H) 04/30/2013   Lab Results  Component Value Date   INSULIN 12.3 07/24/2020   INSULIN 5.3 04/08/2020   Lab Results  Component Value Date   TSH 0.922 04/08/2020   Lab Results  Component Value Date   CHOL 137 07/24/2020   HDL 61 07/24/2020   LDLCALC 62  07/24/2020   TRIG 69 07/24/2020   CHOLHDL 2.8 07/26/2019   Lab Results  Component Value Date   VD25OH 53.6 07/24/2020   VD25OH 60.0 04/08/2020   Lab Results  Component Value Date   WBC 5.3 05/18/2017   HGB 14.4 05/18/2017   HCT 43.2 05/18/2017   MCV 86 05/18/2017   PLT 248 05/18/2017   No results found for: IRON, TIBC, FERRITIN  Obesity Behavioral Intervention:   Approximately 15 minutes were spent on the discussion below.  ASK: We discussed the diagnosis of obesity with Richard [name] today and Richard [name] agreed to give [place] permission to discuss obesity behavioral modification therapy today.  ASSESS: Richard [name] has the diagnosis of obesity and his BMI today is 36.8. Richard [name] is in the action stage of change.   ADVISE: Richard [name] was educated on the multiple health risks of obesity as well as the benefit of weight loss to improve his health. He was advised of the need for long term treatment and the importance of lifestyle modifications to improve his current health and to decrease his risk of future health problems.  AGREE: Multiple dietary modification options and treatment options were discussed and Richard [name] agreed to follow the recommendations documented in the above note.  ARRANGE: Richard [name] was educated on the importance of frequent visits to treat obesity as outlined per CMS and USPSTF guidelines and agreed to schedule his next follow up appointment today.  Attestation Statements:   Reviewed by clinician on day of visit: allergies, medications, problem list, medical history, surgical history, family history, social history, and previous encounter notes.  Coral Ceo, CMA, am acting as Location manager for [employer], DO.  I have reviewed the above documentation for accuracy and completeness, and I agree with the above. Jearld Lesch, DO

## 2020-09-12 ENCOUNTER — Other Ambulatory Visit (HOSPITAL_COMMUNITY): Payer: Self-pay

## 2020-09-18 ENCOUNTER — Encounter: Payer: Self-pay | Admitting: Emergency Medicine

## 2020-09-18 ENCOUNTER — Other Ambulatory Visit (HOSPITAL_COMMUNITY): Payer: Self-pay

## 2020-09-18 ENCOUNTER — Ambulatory Visit
Admission: EM | Admit: 2020-09-18 | Discharge: 2020-09-18 | Disposition: A | Discharge status: Home / Self Care | Payer: PPO | Attending: Urgent Care | Admitting: Urgent Care

## 2020-09-18 DIAGNOSIS — H6123 Impacted cerumen, bilateral: Secondary | ICD-10-CM

## 2020-09-18 MED ORDER — CARBAMIDE PEROXIDE 6.5 % OT SOLN
5.0000 [drp] | Freq: Two times a day (BID) | OTIC | 0 refills | Status: DC
  Filled 2020-09-18: qty 15, 30d supply, fill #0

## 2020-09-18 NOTE — ED Triage Notes (Signed)
Right ear clogged with cerumen. Patient tried flushing ear with OTC ear flush syringe with no improvement. On inspection, ear canal full of cerumen. Small abrasion to inner ear canal visible, patient states he also tried using a q-tip and may have scraped it then.

## 2020-09-18 NOTE — ED Provider Notes (Signed)
[place]   MRN: [id number] DOB: May 03, 1945  Subjective:   Richard [name] is a 75 y.o. male presenting for acute onset in the past 2 days of decreased hearing of the right ear.  Patient reports a history of difficulty with earwax.  Denies fever, tinnitus, dizziness, ear pain, drainage.  He did try an over-the-counter solution and used a Q-tip but was unsuccessful.  No current facility-administered medications for this encounter.  Current Outpatient Medications:    albuterol (VENTOLIN HFA) 108 (90 Base) MCG/ACT inhaler, INHALE 2 PUFFS BY MOUTH ONCE A DAY AS NEEDED FOR WHEEZING OR SHORTNESS OF BREATH, Disp: 8.5 g, Rfl: 5   amLODipine (NORVASC) 5 MG tablet, TAKE 1 TABLET BY MOUTH DAILY, Disp: 90 tablet, Rfl: 4   aspirin EC 81 MG tablet, Take 1 tablet (81 mg total) by mouth daily., Disp: 90 tablet, Rfl: 3   atorvastatin (LIPITOR) 80 MG tablet, TAKE 1 TABLET BY MOUTH AT BEDTIME., Disp: 90 tablet, Rfl: 4   carvedilol (COREG) 12.5 MG tablet, Take 1 tablet (12.5 mg total) by mouth 2 (two) times daily., Disp: 90 tablet, Rfl: 3   chlorthalidone (HYGROTON) 50 MG tablet, Take 1 tablet (50 mg total) by mouth daily., Disp: 90 tablet, Rfl: 4   ergocalciferol (VITAMIN D2) 1.25 MG (50000 UT) capsule, Take 1 capsule (50,000 Units total) by mouth once a week., Disp: 4 capsule, Rfl: 5   ezetimibe (ZETIA) 10 MG tablet, TAKE 1 TABLET BY MOUTH ONCE DAILY., Disp: 90 tablet, Rfl: 4   fluticasone furoate-vilanterol (BREO ELLIPTA) 200-25 MCG/INH AEPB, Inhale 1 puff into the lungs daily., Disp: 180 each, Rfl: 5   guaiFENesin (MUCINEX) 600 MG 12 hr tablet, Take by mouth 2 (two) times daily., Disp: , Rfl:    ipratropium-albuterol (DUONEB) 0.5-2.5 (3) MG/3ML SOLN, USE 1 VIAL VIA NEBULIZER 2 TIMES DAILY & AT BEDTIME IF NEEDED, Disp: 540 mL, Rfl: 3   ipratropium-albuterol (DUONEB) 0.5-2.5 (3) MG/3ML SOLN, INHALE 1 VIAL VIA NEBULIZER TWICE DAILY AND IF NEEDED AT BEDTIME., Disp: 360 mL, Rfl: 5    montelukast (SINGULAIR) 10 MG tablet, TAKE 1 TABLET BY MOUTH ONCE DAILY, Disp: 90 tablet, Rfl: 3   nitroGLYCERIN (NITROSTAT) 0.4 MG SL tablet, Place 1 tablet (0.4 mg total) under the tongue every 5 (five) minutes as needed. For chest pain, Disp: 25 tablet, Rfl: 5   omeprazole (PRILOSEC) 40 MG capsule, TAKE 1 CAPSULE BY MOUTH TWICE DAILY., Disp: 180 capsule, Rfl: 4   polyethylene glycol-electrolytes (NULYTELY) 420 g solution, USE AS DIRECTED, Disp: 4000 mL, Rfl: 0   potassium chloride (MICRO-K) 10 MEQ CR capsule, Take 1 capsule (10 mEq total) by mouth daily., Disp: 30 capsule, Rfl: 4   tamsulosin (FLOMAX) 0.4 MG CAPS capsule, TAKE 1 CAPSULE BY MOUTH EVERY DAY, Disp: 90 capsule, Rfl: 3   Vitamin D, Ergocalciferol, (DRISDOL) 1.25 MG (50000 UNIT) CAPS capsule, TAKE 1 CAPSULE BY MOUTH ONCE PER WEEK, Disp: 4 capsule, Rfl: 5   Allergies  Allergen Reactions   Beef-Derived Products     *difficulty breathing*   Chicken Protein     *congestion*   Lisinopril Swelling    Angioedema    Peanut-Containing Drug Products     *congestion*    Past Medical History:  Diagnosis Date   Acid reflux disease    Arthritis    Asthma    COPD (chronic obstructive pulmonary disease) (HCC)    Edema, lower extremity    Heart failure, diastolic (HCC)    High cholesterol  Hypertension    Myocardial infarction [facility]) 2004   Dr. Tamala Julian, at [place], yearly visits   OSA treated with BiPAP 04/24/2018   Mild OSA with an AHI of 13.5/hr and O2 sats of 88%. Now on BiPAP at 13/9cm H2O.    Sleep apnea    Vitamin D deficiency      Past Surgical History:  Procedure Laterality Date   CARDIAC CATHETERIZATION  2004   with 2 stents   CARDIOVASCULAR STRESS TEST  2011   COLONOSCOPY     POLYPECTOMY  02/17/2011   Procedure: POLYPECTOMY NASAL;  Surgeon: Delsa Bern;  Location: [facility];  Service: ENT;  Laterality: N/A;   SINUS ENDO W/FUSION  02/17/2011   Procedure: ENDOSCOPIC SINUS SURGERY WITH FUSION NAVIGATION;  Surgeon:  Delsa Bern;  Location: [facility] OR;  Service: ENT;  Laterality: N/A;   TONSILLECTOMY     US ECHOCARDIOGRAPHY      Family History  Problem Relation Age of Onset   Heart attack Father    Diabetes Father    Hypertension Father    Cancer Mother    Alzheimer's disease Mother    Stroke Mother    Hypertension Sister    Diabetes Sister     Social History   Tobacco Use   Smoking status: Former    Pack years: 0.00   Smokeless tobacco: Never  Vaping Use   Vaping Use: Never used  Substance Use Topics   Alcohol use: Yes    Alcohol/week: 6.0 standard drinks    Types: 2 Cans of beer, 4 Standard drinks or equivalent per week    Comment: 5-6 drinks/week   Drug use: No    ROS   Objective:   Vitals: BP (!) 158/81 (BP Location: Left Arm)   Pulse 75   Temp 97.9 F (36.6 C) (Oral)   Resp 14   SpO2 95%   Physical Exam Constitutional:      General: He is not in acute distress.    Appearance: Normal appearance. He is well-developed and normal weight. He is not ill-appearing, toxic-appearing or diaphoretic.  HENT:     Head: Normocephalic and atraumatic.     Right Ear: There is impacted cerumen.     Left Ear: There is impacted cerumen.     Ears:     Comments: TMs clear following ear lavage and curette removal of cerumen.    Nose: Nose normal.     Mouth/Throat:     Pharynx: Oropharynx is clear.  Eyes:     General: No scleral icterus.       Right eye: No discharge.        Left eye: No discharge.     Extraocular Movements: Extraocular movements intact.     Pupils: Pupils are equal, round, and reactive to light.  Cardiovascular:     Rate and Rhythm: Normal rate.  Pulmonary:     Effort: Pulmonary effort is normal.  Musculoskeletal:     Cervical back: Normal range of motion.  Neurological:     Mental Status: He is alert and oriented to person, place, and time.  Psychiatric:        Mood and Affect: Mood normal.        Behavior: Behavior normal.        Thought Content:  Thought content normal.        Judgment: Judgment normal.    Ear lavage performed using mixture of peroxide and water.  Pressure irrigation performed using a bottle and  a thin ear tube.  Bilateral ear lavage.  Curette was used bilaterally as well.   Assessment and Plan :   PDMP not reviewed this encounter.  1. Bilateral impacted cerumen   2. Bilateral hearing loss due to cerumen impaction     Successful bilateral ear lavage but recommend close follow up with ENT.  General management of cerumen impaction reviewed with patient.  Anticipatory guidance provided. Counseled patient on potential for adverse effects with medications prescribed/recommended today, ER and return-to-clinic precautions discussed, patient verbalized understanding.    Jaynee Eagles, PA-C 09/18/20 1144

## 2020-09-24 ENCOUNTER — Other Ambulatory Visit (HOSPITAL_BASED_OUTPATIENT_CLINIC_OR_DEPARTMENT_OTHER): Payer: Self-pay

## 2020-09-24 ENCOUNTER — Ambulatory Visit: Payer: PPO | Attending: Internal Medicine

## 2020-09-24 DIAGNOSIS — Z23 Encounter for immunization: Secondary | ICD-10-CM

## 2020-09-24 MED ORDER — PFIZER-BIONT COVID-19 VAC-TRIS 30 MCG/0.3ML IM SUSP
INTRAMUSCULAR | 0 refills | Status: DC
  Filled 2020-09-24: qty 0.3, 1d supply, fill #0

## 2020-09-24 NOTE — Progress Notes (Signed)
   Covid-19 Vaccination Clinic  Name:  Richard [name]    MRN: [id number] DOB: [birth year]  09/24/2020  Richard [name] was observed post Covid-19 immunization for 15 minutes without incident. He was provided with Vaccine Information Sheet and instruction to access the V-Safe system.   Richard [name] was instructed to call 911 with any severe reactions post vaccine: Difficulty breathing  Swelling of face and throat  A fast heartbeat  A bad rash all over body  Dizziness and weakness   Immunizations Administered     Name Date Dose VIS Date Route   PFIZER Comrnaty(Gray TOP) Covid-19 Vaccine 09/24/2020  2:04 PM 0.3 mL 02/15/2020 Intramuscular   Manufacturer: [place]   Lot: [id number]   [place]: [phone]

## 2020-09-25 ENCOUNTER — Encounter (INDEPENDENT_AMBULATORY_CARE_PROVIDER_SITE_OTHER): Payer: Self-pay | Admitting: Bariatrics

## 2020-09-25 ENCOUNTER — Ambulatory Visit (INDEPENDENT_AMBULATORY_CARE_PROVIDER_SITE_OTHER): Payer: PPO | Admitting: Bariatrics

## 2020-09-25 ENCOUNTER — Other Ambulatory Visit: Payer: Self-pay

## 2020-09-25 VITALS — BP 131/75 | HR 89 | Temp 100.2°F | Ht 68.0 in | Wt 238.0 lb

## 2020-09-25 DIAGNOSIS — Z6839 Body mass index (BMI) 39.0-39.9, adult: Secondary | ICD-10-CM

## 2020-09-25 DIAGNOSIS — R7303 Prediabetes: Secondary | ICD-10-CM | POA: Diagnosis not present

## 2020-09-25 DIAGNOSIS — I10 Essential (primary) hypertension: Secondary | ICD-10-CM

## 2020-10-02 ENCOUNTER — Encounter (INDEPENDENT_AMBULATORY_CARE_PROVIDER_SITE_OTHER): Payer: Self-pay | Admitting: Bariatrics

## 2020-10-02 DIAGNOSIS — J449 Chronic obstructive pulmonary disease, unspecified: Secondary | ICD-10-CM | POA: Diagnosis not present

## 2020-10-02 NOTE — Progress Notes (Signed)
Chief Complaint:   OBESITY Karlos is here to discuss his progress with his obesity treatment plan along with follow-up of his obesity related diagnoses. Garris is on the Category 2 Plan and states he is following his eating plan approximately 80% of the time. Lorenzo states he is walking on the Treadmill for 20 minutes 2 times per week.  Today's visit was #: 9 Starting weight: 258 lbs Starting date: 04/08/2020 Today's weight: 238 lbs Today's date: 10/02/2020 Total lbs lost to date: 20 lbs Total lbs lost since last in-office visit: 4 lbs  Interim History: Md is down an additional 4 lbs and doing well overall. He is doing well with water and protein.  Subjective:   1. Primary hypertension Emily's hypertension is well controlled.  2. Prediabetes Harvey is not on medications.  Assessment/Plan:   1. Primary hypertension Antoney will continue his medication and he is working on healthy weight loss and exercise to improve blood pressure control. We will watch for signs of hypotension as he continues his lifestyle modifications.   2. Prediabetes Euan will continue his medication. He will decrease carbohydrates and increase healthy weight measures. He will increase healthy proteins.   3. Obesity with current BMI of 36.3 Montie is currently in the action stage of change. As such, his goal is to continue with weight loss efforts. He has agreed to the Category 2 Plan.   Jazon will continue to meal plan. He will continue intentional eating.  Exercise goals:  As is, will continue walking on treadmill.  Behavioral modification strategies: increasing lean protein intake, decreasing simple carbohydrates, increasing vegetables, increasing water intake, decreasing eating out, no skipping meals, meal planning and cooking strategies, keeping healthy foods in the home, and planning for success.  Kender has agreed to follow-up with our clinic in 3-4 weeks. He was informed of the importance of  frequent follow-up visits to maximize his success with intensive lifestyle modifications for his multiple health conditions.   Objective:   Blood pressure 131/75, pulse 89, temperature 100.2 F (37.9 C), height '5\' 8"'$  (1.727 m), weight 238 lb (108 kg), SpO2 98 %. Body mass index is 36.19 kg/m.  General: Cooperative, alert, well developed, in no acute distress. HEENT: Conjunctivae and lids unremarkable. Cardiovascular: Regular rhythm.  Lungs: Normal work of breathing. Neurologic: No focal deficits.   Lab Results  Component Value Date   CREATININE 0.84 07/24/2020   BUN 14 07/24/2020   NA 131 (L) 07/24/2020   K 3.5 07/24/2020   CL 91 (L) 07/24/2020   CO2 22 07/24/2020   Lab Results  Component Value Date   ALT 15 07/24/2020   AST 27 07/24/2020   ALKPHOS 103 07/24/2020   BILITOT 0.5 07/24/2020   Lab Results  Component Value Date   HGBA1C 6.3 (H) 07/24/2020   HGBA1C 6.2 (H) 04/08/2020   HGBA1C 6.1 (H) 04/30/2013   Lab Results  Component Value Date   INSULIN 12.3 07/24/2020   INSULIN 5.3 04/08/2020   Lab Results  Component Value Date   TSH 0.922 04/08/2020   Lab Results  Component Value Date   CHOL 137 07/24/2020   HDL 61 07/24/2020   LDLCALC 62 07/24/2020   TRIG 69 07/24/2020   CHOLHDL 2.8 07/26/2019   Lab Results  Component Value Date   VD25OH 53.6 07/24/2020   VD25OH 60.0 04/08/2020   Lab Results  Component Value Date   WBC 5.3 05/18/2017   HGB 14.4 05/18/2017   HCT 43.2 05/18/2017  MCV 86 05/18/2017   PLT 248 05/18/2017   No results found for: IRON, TIBC, FERRITIN  Obesity Behavioral Intervention:   Approximately 15 minutes were spent on the discussion below.  ASK: We discussed the diagnosis of obesity with [name] today and Eloy agreed to give [place] permission to discuss obesity behavioral modification therapy today.  ASSESS: Johnnie has the diagnosis of obesity and his BMI today is 36.3. Corley is in the action stage of change.   ADVISE: Aashir  was educated on the multiple health risks of obesity as well as the benefit of weight loss to improve his health. He was advised of the need for long term treatment and the importance of lifestyle modifications to improve his current health and to decrease his risk of future health problems.  AGREE: Multiple dietary modification options and treatment options were discussed and Criss agreed to follow the recommendations documented in the above note.  ARRANGE: Colburn was educated on the importance of frequent visits to treat obesity as outlined per CMS and USPSTF guidelines and agreed to schedule his next follow up appointment today.  Attestation Statements:   Reviewed by clinician on day of visit: allergies, medications, problem list, medical history, surgical history, family history, social history, and previous encounter notes.  I, Lizbeth Bark, RMA, am acting as Location manager for [employer], DO.   I have reviewed the above documentation for accuracy and completeness, and I agree with the above. Jearld Lesch, DO

## 2020-10-03 DIAGNOSIS — E7800 Pure hypercholesterolemia, unspecified: Secondary | ICD-10-CM | POA: Diagnosis not present

## 2020-10-03 DIAGNOSIS — J4540 Moderate persistent asthma, uncomplicated: Secondary | ICD-10-CM | POA: Diagnosis not present

## 2020-10-03 DIAGNOSIS — Z79899 Other long term (current) drug therapy: Secondary | ICD-10-CM | POA: Diagnosis not present

## 2020-10-03 DIAGNOSIS — I119 Hypertensive heart disease without heart failure: Secondary | ICD-10-CM | POA: Diagnosis not present

## 2020-10-03 DIAGNOSIS — D638 Anemia in other chronic diseases classified elsewhere: Secondary | ICD-10-CM | POA: Diagnosis not present

## 2020-10-03 DIAGNOSIS — E559 Vitamin D deficiency, unspecified: Secondary | ICD-10-CM | POA: Diagnosis not present

## 2020-10-03 DIAGNOSIS — E875 Hyperkalemia: Secondary | ICD-10-CM | POA: Diagnosis not present

## 2020-10-03 DIAGNOSIS — E012 Iodine-deficiency related (endemic) goiter, unspecified: Secondary | ICD-10-CM | POA: Diagnosis not present

## 2020-10-03 DIAGNOSIS — R0600 Dyspnea, unspecified: Secondary | ICD-10-CM | POA: Diagnosis not present

## 2020-10-03 DIAGNOSIS — I5030 Unspecified diastolic (congestive) heart failure: Secondary | ICD-10-CM | POA: Diagnosis not present

## 2020-10-03 DIAGNOSIS — J449 Chronic obstructive pulmonary disease, unspecified: Secondary | ICD-10-CM | POA: Diagnosis not present

## 2020-10-03 DIAGNOSIS — I2510 Atherosclerotic heart disease of native coronary artery without angina pectoris: Secondary | ICD-10-CM | POA: Diagnosis not present

## 2020-10-06 ENCOUNTER — Other Ambulatory Visit (HOSPITAL_COMMUNITY): Payer: Self-pay

## 2020-10-06 MED FILL — Amlodipine Besylate Tab 5 MG (Base Equivalent): ORAL | 90 days supply | Qty: 90 | Fill #1 | Status: AC

## 2020-10-07 ENCOUNTER — Other Ambulatory Visit (HOSPITAL_COMMUNITY): Payer: Self-pay

## 2020-10-08 ENCOUNTER — Other Ambulatory Visit (HOSPITAL_COMMUNITY): Payer: Self-pay

## 2020-10-09 ENCOUNTER — Other Ambulatory Visit (HOSPITAL_COMMUNITY): Payer: Self-pay

## 2020-10-10 ENCOUNTER — Other Ambulatory Visit (HOSPITAL_COMMUNITY): Payer: Self-pay

## 2020-10-10 MED ORDER — MONTELUKAST SODIUM 10 MG PO TABS
10.0000 mg | ORAL_TABLET | Freq: Every day | ORAL | 3 refills | Status: DC
  Filled 2020-10-10: qty 90, 90d supply, fill #0
  Filled 2021-01-05: qty 90, 90d supply, fill #1
  Filled 2021-03-24: qty 90, 90d supply, fill #2
  Filled 2021-06-30: qty 90, 90d supply, fill #3

## 2020-10-10 MED FILL — Ipratropium-Albuterol Nebu Soln 0.5-2.5(3) MG/3ML: RESPIRATORY_TRACT | 30 days supply | Qty: 180 | Fill #3 | Status: AC

## 2020-10-16 ENCOUNTER — Other Ambulatory Visit: Payer: Self-pay

## 2020-10-16 ENCOUNTER — Encounter (INDEPENDENT_AMBULATORY_CARE_PROVIDER_SITE_OTHER): Payer: Self-pay | Admitting: Bariatrics

## 2020-10-16 ENCOUNTER — Ambulatory Visit (INDEPENDENT_AMBULATORY_CARE_PROVIDER_SITE_OTHER): Payer: PPO | Admitting: Bariatrics

## 2020-10-16 VITALS — BP 127/68 | HR 76 | Temp 97.8°F | Ht 68.0 in | Wt 236.0 lb

## 2020-10-16 DIAGNOSIS — E7849 Other hyperlipidemia: Secondary | ICD-10-CM

## 2020-10-16 DIAGNOSIS — R7303 Prediabetes: Secondary | ICD-10-CM | POA: Diagnosis not present

## 2020-10-16 DIAGNOSIS — Z6839 Body mass index (BMI) 39.0-39.9, adult: Secondary | ICD-10-CM

## 2020-10-16 NOTE — Progress Notes (Signed)
Chief Complaint:   OBESITY Richard [name] is here to discuss his progress with his obesity treatment plan along with follow-up of his obesity related diagnoses. Richard [name] is on the Category 2 Plan and states he is following his eating plan approximately 80% of the time. Richard [name] states he is on the treadmill for 20 minutes 1 time per week.  Today's visit was #: 10 Starting weight: 258 lbs Starting date: 04/08/2020 Today's weight: 236 lbs Today's date: 10/16/2020 Total lbs lost to date: 22 Total lbs lost since last in-office visit: 2  Interim History: Richard [name] is down another 2 lbs since his last visit. He is getting adequate protein in. He is going to the gym less often.  Subjective:   1. Pre-diabetes Richard [name] is not on medications currently.  2. Other hyperlipidemia Richard [name] is taking Lipitor.  Assessment/Plan:   1. Pre-diabetes Richard [name] will continue to work on the meal plan, activities/exercise, and decreasing simple carbohydrates to help decrease the risk of diabetes.   2. Other hyperlipidemia Cardiovascular risk and specific lipid/LDL goals reviewed. We discussed several lifestyle modifications today. Richard [name] will continue Lipitor, and will continue to work on diet, exercise and weight loss efforts. Orders and follow up as documented in patient record.   Counseling Intensive lifestyle modifications are the first line treatment for this issue. Dietary changes: Increase soluble fiber. Decrease simple carbohydrates. Exercise changes: Moderate to vigorous-intensity aerobic activity 150 minutes per week if tolerated. Lipid-lowering medications: see documented in medical record.  3. Obesity with current BMI of 35.9 Richard [name] is currently in the action stage of change. As such, his goal is to continue with weight loss efforts. He has agreed to the Category 2 Plan.   Intentional eating was discussed.  Exercise goals: As is.  Behavioral modification strategies: increasing lean protein intake, decreasing  simple carbohydrates, increasing vegetables, increasing water intake, decreasing eating out, no skipping meals, meal planning and cooking strategies, keeping healthy foods in the home, and planning for success.  Richard [name] has agreed to follow-up with our clinic in 3 weeks. He was informed of the importance of frequent follow-up visits to maximize his success with intensive lifestyle modifications for his multiple health conditions.   Objective:   Blood pressure 127/68, pulse 76, temperature 97.8 F (36.6 C), height '5\' 8"'$  (1.727 m), weight 236 lb (107 kg), SpO2 98 %. Body mass index is 35.88 kg/m.  General: Cooperative, alert, well developed, in no acute distress. HEENT: Conjunctivae and lids unremarkable. Cardiovascular: Regular rhythm.  Lungs: Normal work of breathing. Neurologic: No focal deficits.   Lab Results  Component Value Date   CREATININE 0.84 07/24/2020   BUN 14 07/24/2020   NA 131 (L) 07/24/2020   K 3.5 07/24/2020   CL 91 (L) 07/24/2020   CO2 22 07/24/2020   Lab Results  Component Value Date   ALT 15 07/24/2020   AST 27 07/24/2020   ALKPHOS 103 07/24/2020   BILITOT 0.5 07/24/2020   Lab Results  Component Value Date   HGBA1C 6.3 (H) 07/24/2020   HGBA1C 6.2 (H) 04/08/2020   HGBA1C 6.1 (H) 04/30/2013   Lab Results  Component Value Date   INSULIN 12.3 07/24/2020   INSULIN 5.3 04/08/2020   Lab Results  Component Value Date   TSH 0.922 04/08/2020   Lab Results  Component Value Date   CHOL 137 07/24/2020   HDL 61 07/24/2020   LDLCALC 62 07/24/2020   TRIG 69 07/24/2020   CHOLHDL 2.8 07/26/2019   Lab Results  Component Value Date   VD25OH 53.6 07/24/2020   VD25OH 60.0 04/08/2020   Lab Results  Component Value Date   WBC 5.3 05/18/2017   HGB 14.4 05/18/2017   HCT 43.2 05/18/2017   MCV 86 05/18/2017   PLT 248 05/18/2017   No results found for: IRON, TIBC, FERRITIN  Obesity Behavioral Intervention:   Approximately 15 minutes were spent on the  discussion below.  ASK: We discussed the diagnosis of obesity with Richard [name] today and Richard [name] agreed to give [place] permission to discuss obesity behavioral modification therapy today.  ASSESS: Richard [name] has the diagnosis of obesity and his BMI today is 35.89. Richard [name] is in the action stage of change.   ADVISE: Richard [name] was educated on the multiple health risks of obesity as well as the benefit of weight loss to improve his health. He was advised of the need for long term treatment and the importance of lifestyle modifications to improve his current health and to decrease his risk of future health problems.  AGREE: Multiple dietary modification options and treatment options were discussed and Richard [name] agreed to follow the recommendations documented in the above note.  ARRANGE: Richard [name] was educated on the importance of frequent visits to treat obesity as outlined per CMS and USPSTF guidelines and agreed to schedule his next follow up appointment today.  Attestation Statements:   Reviewed by clinician on day of visit: allergies, medications, problem list, medical history, surgical history, family history, social history, and previous encounter notes.   Wilhemena Durie, am acting as Location manager for [employer], DO.  I have reviewed the above documentation for accuracy and completeness, and I agree with the above. Jearld Lesch, DO

## 2020-10-17 ENCOUNTER — Encounter (INDEPENDENT_AMBULATORY_CARE_PROVIDER_SITE_OTHER): Payer: Self-pay | Admitting: Bariatrics

## 2020-10-21 ENCOUNTER — Other Ambulatory Visit (HOSPITAL_COMMUNITY): Payer: Self-pay

## 2020-10-21 ENCOUNTER — Ambulatory Visit
Admission: EM | Admit: 2020-10-21 | Discharge: 2020-10-21 | Disposition: A | Discharge status: Home / Self Care | Payer: PPO | Attending: Emergency Medicine | Admitting: Emergency Medicine

## 2020-10-21 ENCOUNTER — Encounter: Payer: Self-pay | Admitting: Emergency Medicine

## 2020-10-21 DIAGNOSIS — Z7689 Persons encountering health services in other specified circumstances: Secondary | ICD-10-CM | POA: Diagnosis not present

## 2020-10-21 DIAGNOSIS — G4733 Obstructive sleep apnea (adult) (pediatric): Secondary | ICD-10-CM | POA: Diagnosis not present

## 2020-10-21 DIAGNOSIS — Z20822 Contact with and (suspected) exposure to covid-19: Secondary | ICD-10-CM

## 2020-10-21 MED FILL — Ezetimibe Tab 10 MG: ORAL | 90 days supply | Qty: 90 | Fill #1 | Status: AC

## 2020-10-21 MED FILL — Tamsulosin HCl Cap 0.4 MG: ORAL | 90 days supply | Qty: 90 | Fill #1 | Status: AC

## 2020-10-21 NOTE — ED Triage Notes (Signed)
Wife was covid positive last week. Patient states he's got chronic health issues and SOB. Took a test this morning and it said he was positive. Appears stable in triage.

## 2020-10-21 NOTE — ED Provider Notes (Signed)
[facility]    CSN: [id number] Arrival date & time: 10/21/20  1450      History   Chief Complaint Chief Complaint  Patient presents with   Covid Positive    HPI Richard [name] is a 75 y.o. male.   Patients presents requesting COVID testing, had 1 positive test at home and 1 negative test. Denies all URI symptoms, abdominal pain ,N/V/D, chest pain or tightness. Wife was COVID positive last week. History of COPD, HTN, CHF, sleep apnhea, MI, prediabetic, Asthma.    Past Medical History:  Diagnosis Date   Acid reflux disease    Arthritis    Asthma    COPD (chronic obstructive pulmonary disease) (HCC)    Edema, lower extremity    Heart failure, diastolic (HCC)    High cholesterol    Hypertension    Myocardial infarction [facility]) 2004   Dr. Tamala Julian, at [place], yearly visits   OSA treated with BiPAP 04/24/2018   Mild OSA with an AHI of 13.5/hr and O2 sats of 88%. Now on BiPAP at 13/9cm H2O.    Sleep apnea    Vitamin D deficiency     Patient Active Problem List   Diagnosis Date Noted   Class 2 severe obesity with serious comorbidity and body mass index (BMI) of 39.0 to 39.9 in adult ([place]) 06/04/2020   COPD, moderate ([place]) 05/09/2020   Prediabetes 04/09/2020   Obesity (BMI 30-39.9) 04/24/2018   OSA treated with BiPAP 04/24/2018   Dyspnea on exertion 09/13/2017   Right bundle branch block 06/27/2014   Hyperlipidemia 06/26/2013   Influenza due to identified novel influenza A virus with other respiratory manifestations 05/01/2013   CAP (community acquired pneumonia) 04/30/2013   CAD in native artery 04/30/2013   Asthma 04/30/2013   Hypoxia 04/30/2013   Hypertension    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    Class: Chronic    Past Surgical History:  Procedure Laterality Date   CARDIAC CATHETERIZATION  2004   with 2 stents   CARDIOVASCULAR STRESS TEST  2011   COLONOSCOPY     POLYPECTOMY  02/17/2011   Procedure: POLYPECTOMY NASAL;  Surgeon: Delsa Bern;  Location: [facility];  Service: ENT;  Laterality: N/A;   SINUS ENDO W/FUSION  02/17/2011   Procedure: ENDOSCOPIC SINUS SURGERY WITH FUSION NAVIGATION;  Surgeon: Delsa Bern;  Location: [facility];  Service: ENT;  Laterality: N/A;   TONSILLECTOMY     US ECHOCARDIOGRAPHY         Home Medications    Prior to Admission medications   Medication Sig Start Date End Date Taking? Authorizing Provider  albuterol (VENTOLIN HFA) 108 (90 Base) MCG/ACT inhaler INHALE 2 PUFFS BY MOUTH ONCE A DAY AS NEEDED FOR WHEEZING OR SHORTNESS OF BREATH 01/29/20 01/28/21  Vincente Liberty, MD  amLODipine (NORVASC) 5 MG tablet TAKE 1 TABLET BY MOUTH DAILY 10/23/19 01/05/21  Vincente Liberty, MD  aspirin EC 81 MG tablet Take 1 tablet (81 mg total) by mouth daily. 04/22/17   Daune Perch, NP  atorvastatin (LIPITOR) 80 MG tablet TAKE 1 TABLET BY MOUTH AT BEDTIME. 11/28/19 12/09/20  Vincente Liberty, MD  carvedilol (COREG) 12.5 MG tablet Take 1 tablet (12.5 mg total) by mouth 2 (two) times daily. 07/08/20   Belva Crome, MD  chlorthalidone (HYGROTON) 50 MG tablet Take 1 tablet (50 mg total) by mouth daily. 07/16/20     COVID-19 mRNA Vac-TriS, Pfizer, (PFIZER-BIONT COVID-19 VAC-TRIS) SUSP injection Inject into the muscle. 09/24/20  Carlyle Basques, MD  ergocalciferol (VITAMIN D2) 1.25 MG (50000 UT) capsule Take 1 capsule (50,000 Units total) by mouth once a week. 06/10/20     ezetimibe (ZETIA) 10 MG tablet TAKE 1 TABLET BY MOUTH ONCE DAILY. 01/09/20 01/26/21  Vincente Liberty, MD  fluticasone furoate-vilanterol (BREO ELLIPTA) 200-25 MCG/INH AEPB Inhale 1 puff into the lungs daily. 07/30/20     guaiFENesin (MUCINEX) 600 MG 12 hr tablet Take by mouth 2 (two) times daily.    [provider]  ipratropium-albuterol (DUONEB) 0.5-2.5 (3) MG/3ML SOLN USE 1 VIAL VIA NEBULIZER 2 TIMES DAILY & AT BEDTIME IF NEEDED 03/28/20 03/28/21  Vincente Liberty, MD  ipratropium-albuterol (DUONEB) 0.5-2.5 (3) MG/3ML SOLN INHALE  1 VIAL VIA NEBULIZER TWICE DAILY AND IF NEEDED AT BEDTIME. 01/09/20 01/08/21  Vincente Liberty, MD  montelukast (SINGULAIR) 10 MG tablet Take 1 tablet (10 mg total) by mouth daily. 10/10/20     nitroGLYCERIN (NITROSTAT) 0.4 MG SL tablet Place 1 tablet (0.4 mg total) under the tongue every 5 (five) minutes as needed. For chest pain 12/09/18   Belva Crome, MD  omeprazole (PRILOSEC) 40 MG capsule TAKE 1 CAPSULE BY MOUTH TWICE DAILY. 01/09/20 01/08/21  Vincente Liberty, MD  polyethylene glycol-electrolytes (NULYTELY) 420 g solution USE AS DIRECTED 12/04/19 12/03/20  Wilford Corner, MD  potassium chloride (MICRO-K) 10 MEQ CR capsule Take 1 capsule (10 mEq total) by mouth daily. 08/13/20     tamsulosin (FLOMAX) 0.4 MG CAPS capsule TAKE 1 CAPSULE BY MOUTH EVERY DAY 04/30/20 04/30/21  Vincente Liberty, MD  Vitamin D, Ergocalciferol, (DRISDOL) 1.25 MG (50000 UNIT) CAPS capsule TAKE 1 CAPSULE BY MOUTH ONCE PER WEEK 12/07/19 12/06/20  Vincente Liberty, MD    Family History Family History  Problem Relation Age of Onset   Heart attack Father    Diabetes Father    Hypertension Father    Cancer Mother    Alzheimer's disease Mother    Stroke Mother    Hypertension Sister    Diabetes Sister     Social History Social History   Tobacco Use   Smoking status: Former   Smokeless tobacco: Never  Scientific laboratory technician Use: Never used  Substance Use Topics   Alcohol use: Yes    Alcohol/week: 6.0 standard drinks    Types: 2 Cans of beer, 4 Standard drinks or equivalent per week    Comment: 5-6 drinks/week   Drug use: No     Allergies   Beef-derived products, Chicken protein, Lisinopril, and Peanut-containing drug products   Review of Systems Review of Systems  Constitutional: Negative.   HENT: Negative.    Respiratory: Negative.    Cardiovascular: Negative.   Gastrointestinal: Negative.   Skin: Negative.   Neurological: Negative.     Physical Exam Triage Vital Signs ED Triage Vitals  Enc  Vitals Group     BP 10/21/20 1532 124/67     Pulse Rate 10/21/20 1532 78     Resp 10/21/20 1532 16     Temp 10/21/20 1532 98.3 F (36.8 C)     Temp Source 10/21/20 1532 Oral     SpO2 10/21/20 1532 97 %     Weight --      Height --      Head Circumference --      Peak Flow --      Pain Score 10/21/20 1534 0     Pain Loc --      Pain Edu? --  Excl. in GC? --    No data found.  Updated Vital Signs BP 124/67 (BP Location: Left Arm)   Pulse 78   Temp 98.3 F (36.8 C) (Oral)   Resp 16   SpO2 97%   Visual Acuity Right Eye Distance:   Left Eye Distance:   Bilateral Distance:    Right Eye Near:   Left Eye Near:    Bilateral Near:     Physical Exam Constitutional:      Appearance: Normal appearance.  HENT:     Head: Normocephalic.     Right Ear: Tympanic membrane, ear canal and external ear normal.     Left Ear: Tympanic membrane, ear canal and external ear normal.     Nose: Nose normal.     Mouth/Throat:     Mouth: Mucous membranes are moist.     Pharynx: Oropharynx is clear.  Eyes:     Extraocular Movements: Extraocular movements intact.     Conjunctiva/sclera: Conjunctivae normal.     Pupils: Pupils are equal, round, and reactive to light.  Cardiovascular:     Rate and Rhythm: Normal rate and regular rhythm.     Pulses: Normal pulses.     Heart sounds: Normal heart sounds.  Pulmonary:     Effort: Pulmonary effort is normal.     Breath sounds: Normal breath sounds.  Musculoskeletal:     Cervical back: Normal range of motion and neck supple.  Skin:    General: Skin is warm and dry.  Neurological:     Mental Status: He is alert and oriented to person, place, and time. Mental status is at baseline.  Psychiatric:        Mood and Affect: Mood normal.        Behavior: Behavior normal.     UC Treatments / Results  Labs (all labs ordered are listed, but only abnormal results are displayed) Labs Reviewed  NOVEL CORONAVIRUS, NAA    EKG   Radiology No  results found.  Procedures Procedures (including critical care time)  Medications Ordered in UC Medications - No data to display  Initial Impression / Assessment and Plan / UC Course  I have reviewed the triage vital signs and the nursing notes.  Pertinent labs & imaging results that were available during my care of the patient were reviewed by me and considered in my medical decision making (see chart for details).  Exposure to [place] test pending Given list of otc medications that may be used if symptoms occur Return precautions for respiratory involvement to go to nearest emergency department for evaluation  Final Clinical Impressions(s) / UC Diagnoses   Final diagnoses:  Exposure to COVID-19 virus     Discharge Instructions      COVID test take about 2 days to result, We will contact you if your COVID test is positive.  Please quarantine while you wait for the results.  If your test is negative you may resume normal activities.  If your test is positive please continue to quarantine for at least 5 days from your symptom onset or until you are without a fever for at least 24 hours after the medications.  If symptoms begin you may try:     You can take Tylenol and/or Ibuprofen as needed for fever reduction and pain relief.   For cough: honey 1/2 to 1 teaspoon (you can dilute the honey in water or another fluid).  You can also use guaifenesin and dextromethorphan for cough.  You can use a humidifier for chest congestion and cough.  If you don't have a humidifier, you can sit in the bathroom with the hot shower running.      For sore throat: try warm salt water gargles, cepacol lozenges, throat spray, warm tea or water with lemon/honey, popsicles or ice, or OTC cold relief medicine for throat discomfort.   For congestion: take a daily anti-histamine like Zyrtec, Claritin, and a oral decongestant, such as pseudoephedrine.  You can also use Flonase 1-2 sprays in each nostril  daily.   It is important to stay hydrated: drink plenty of fluids (water, gatorade/powerade/pedialyte, juices, or teas) to keep your throat moisturized and help further relieve irritation/discomfort.     go to the Emergency Department if your shortness of breath worsens, have difficulty breathing, chest pain or tightness     ED Prescriptions   None    PDMP not reviewed this encounter.   [name] Eden, NP 10/21/20 1711

## 2020-10-21 NOTE — Discharge Instructions (Addendum)
COVID test take about 2 days to result, We will contact you if your COVID test is positive.  Please quarantine while you wait for the results.  If your test is negative you may resume normal activities.  If your test is positive please continue to quarantine for at least 5 days from your symptom onset or until you are without a fever for at least 24 hours after the medications.  If symptoms begin you may try:     You can take Tylenol and/or Ibuprofen as needed for fever reduction and pain relief.   For cough: honey 1/2 to 1 teaspoon (you can dilute the honey in water or another fluid).  You can also use guaifenesin and dextromethorphan for cough. You can use a humidifier for chest congestion and cough.  If you don't have a humidifier, you can sit in the bathroom with the hot shower running.      For sore throat: try warm salt water gargles, cepacol lozenges, throat spray, warm tea or water with lemon/honey, popsicles or ice, or OTC cold relief medicine for throat discomfort.   For congestion: take a daily anti-histamine like Zyrtec, Claritin, and a oral decongestant, such as pseudoephedrine.  You can also use Flonase 1-2 sprays in each nostril daily.   It is important to stay hydrated: drink plenty of fluids (water, gatorade/powerade/pedialyte, juices, or teas) to keep your throat moisturized and help further relieve irritation/discomfort.     go to the Emergency Department if your shortness of breath worsens, have difficulty breathing, chest pain or tightness

## 2020-10-22 LAB — NOVEL CORONAVIRUS, NAA: SARS-CoV-2, NAA: NOT DETECTED

## 2020-10-22 LAB — SARS-COV-2, NAA 2 DAY TAT

## 2020-11-07 MED FILL — Ipratropium-Albuterol Nebu Soln 0.5-2.5(3) MG/3ML: RESPIRATORY_TRACT | 30 days supply | Qty: 180 | Fill #4 | Status: AC

## 2020-11-08 ENCOUNTER — Other Ambulatory Visit (HOSPITAL_COMMUNITY): Payer: Self-pay

## 2020-11-12 ENCOUNTER — Other Ambulatory Visit (HOSPITAL_COMMUNITY): Payer: Self-pay

## 2020-11-14 ENCOUNTER — Encounter (INDEPENDENT_AMBULATORY_CARE_PROVIDER_SITE_OTHER): Payer: Self-pay | Admitting: Bariatrics

## 2020-11-14 ENCOUNTER — Ambulatory Visit (INDEPENDENT_AMBULATORY_CARE_PROVIDER_SITE_OTHER): Payer: PPO | Admitting: Bariatrics

## 2020-11-14 ENCOUNTER — Other Ambulatory Visit: Payer: Self-pay

## 2020-11-14 VITALS — BP 136/78 | HR 78 | Temp 97.6°F | Ht 68.0 in | Wt 233.0 lb

## 2020-11-14 DIAGNOSIS — E7849 Other hyperlipidemia: Secondary | ICD-10-CM

## 2020-11-14 DIAGNOSIS — I10 Essential (primary) hypertension: Secondary | ICD-10-CM | POA: Diagnosis not present

## 2020-11-14 DIAGNOSIS — Z6839 Body mass index (BMI) 39.0-39.9, adult: Secondary | ICD-10-CM

## 2020-11-14 NOTE — Progress Notes (Signed)
Chief Complaint:   OBESITY Eulalio is here to discuss his progress with his obesity treatment plan along with follow-up of his obesity related diagnoses. Benzino is on the Category 2 Plan and states he is following his eating plan approximately 80% of the time. Yochanan states he is walking on the treadmill for 20 minutes 1 times per week.  Today's visit was #: 11 Starting weight: 258 lbs Starting date: 04/08/2020 Today's weight: 233 lbs Today's date:11/14/2020 Total lbs lost to date: 25 lbs Total lbs lost since last in-office visit: 3 lbs  Interim History: Awan is down an additional 3 lbs. He lacks motivation for exercise. He is getting adequate protein and no missed meals.  Subjective:   1. Essential hypertension Hulen is currently taking Norvasc, Coreg and Hygroton.  2. Other hyperlipidemia Ignace is currently taking Lipitor and Zetia.  Assessment/Plan:   1. Essential hypertension Shayquan will continue medication. He is working on healthy weight loss and exercise to improve blood pressure control. We will watch for signs of hypotension as he continues his lifestyle modifications.  2. Other hyperlipidemia Cardiovascular risk and specific lipid/LDL goals reviewed.  We discussed several lifestyle modifications today and Denzel will continue to work on diet, exercise and weight loss efforts. Lex will continue medications. Orders and follow up as documented in patient record.   Counseling Intensive lifestyle modifications are the first line treatment for this issue. Dietary changes: Increase soluble fiber. Decrease simple carbohydrates. Exercise changes: Moderate to vigorous-intensity aerobic activity 150 minutes per week if tolerated. Lipid-lowering medications: see documented in medical record.   3. Obesity with current BMI of 35.4 Isileli is currently in the action stage of change. As such, his goal is to continue with weight loss efforts. He has agreed to the Category 2 Plan.    Jadier will continue meal planning. He will continue intentional eating. He will adhere closely to the plan.  Exercise goals:  Adalid will resume with the treadmill and hand weights.  Behavioral modification strategies: increasing lean protein intake, decreasing simple carbohydrates, increasing vegetables, increasing water intake, decreasing eating out, no skipping meals, meal planning and cooking strategies, keeping healthy foods in the home, and planning for success.  Syed has agreed to follow-up with our clinic in 3-4 weeks (fasting). He was informed of the importance of frequent follow-up visits to maximize his success with intensive lifestyle modifications for his multiple health conditions.   Objective:   Blood pressure 136/78, pulse 78, temperature 97.6 F (36.4 C), height '5\' 8"'$  (1.727 m), weight 233 lb (105.7 kg), SpO2 98 %. Body mass index is 35.43 kg/m.  General: Cooperative, alert, well developed, in no acute distress. HEENT: Conjunctivae and lids unremarkable. Cardiovascular: Regular rhythm.  Lungs: Normal work of breathing. Neurologic: No focal deficits.   Lab Results  Component Value Date   CREATININE 0.84 07/24/2020   BUN 14 07/24/2020   NA 131 (L) 07/24/2020   K 3.5 07/24/2020   CL 91 (L) 07/24/2020   CO2 22 07/24/2020   Lab Results  Component Value Date   ALT 15 07/24/2020   AST 27 07/24/2020   ALKPHOS 103 07/24/2020   BILITOT 0.5 07/24/2020   Lab Results  Component Value Date   HGBA1C 6.3 (H) 07/24/2020   HGBA1C 6.2 (H) 04/08/2020   HGBA1C 6.1 (H) 04/30/2013   Lab Results  Component Value Date   INSULIN 12.3 07/24/2020   INSULIN 5.3 04/08/2020   Lab Results  Component Value Date   TSH  0.922 04/08/2020   Lab Results  Component Value Date   CHOL 137 07/24/2020   HDL 61 07/24/2020   LDLCALC 62 07/24/2020   TRIG 69 07/24/2020   CHOLHDL 2.8 07/26/2019   Lab Results  Component Value Date   VD25OH 53.6 07/24/2020   VD25OH 60.0 04/08/2020    Lab Results  Component Value Date   WBC 5.3 05/18/2017   HGB 14.4 05/18/2017   HCT 43.2 05/18/2017   MCV 86 05/18/2017   PLT 248 05/18/2017   No results found for: IRON, TIBC, FERRITIN  Obesity Behavioral Intervention:   Approximately 15 minutes were spent on the discussion below.  ASK: We discussed the diagnosis of obesity with [name] today and Jozsef agreed to give [place] permission to discuss obesity behavioral modification therapy today.  ASSESS: Berne has the diagnosis of obesity and his BMI today is 35.4. Sirius is in the action stage of change.   ADVISE: Taegan was educated on the multiple health risks of obesity as well as the benefit of weight loss to improve his health. He was advised of the need for long term treatment and the importance of lifestyle modifications to improve his current health and to decrease his risk of future health problems.  AGREE: Multiple dietary modification options and treatment options were discussed and Nishant agreed to follow the recommendations documented in the above note.  ARRANGE: Santi was educated on the importance of frequent visits to treat obesity as outlined per CMS and USPSTF guidelines and agreed to schedule his next follow up appointment today.  Attestation Statements:   Reviewed by clinician on day of visit: allergies, medications, problem list, medical history, surgical history, family history, social history, and previous encounter notes.  I, Lizbeth Bark, RMA, am acting as Location manager for [employer], DO.   I have reviewed the above documentation for accuracy and completeness, and I agree with the above. Jearld Lesch, DO

## 2020-11-17 ENCOUNTER — Encounter (INDEPENDENT_AMBULATORY_CARE_PROVIDER_SITE_OTHER): Payer: Self-pay | Admitting: Bariatrics

## 2020-11-18 ENCOUNTER — Other Ambulatory Visit (HOSPITAL_COMMUNITY): Payer: Self-pay

## 2020-12-07 MED FILL — Ipratropium-Albuterol Nebu Soln 0.5-2.5(3) MG/3ML: RESPIRATORY_TRACT | 30 days supply | Qty: 180 | Fill #5 | Status: AC

## 2020-12-09 ENCOUNTER — Ambulatory Visit (INDEPENDENT_AMBULATORY_CARE_PROVIDER_SITE_OTHER): Payer: PPO | Admitting: Bariatrics

## 2020-12-09 ENCOUNTER — Encounter (INDEPENDENT_AMBULATORY_CARE_PROVIDER_SITE_OTHER): Payer: Self-pay | Admitting: Bariatrics

## 2020-12-09 ENCOUNTER — Other Ambulatory Visit: Payer: Self-pay

## 2020-12-09 ENCOUNTER — Other Ambulatory Visit (HOSPITAL_COMMUNITY): Payer: Self-pay

## 2020-12-09 VITALS — BP 130/72 | HR 70 | Temp 98.2°F | Ht 68.0 in | Wt 230.0 lb

## 2020-12-09 DIAGNOSIS — Z6839 Body mass index (BMI) 39.0-39.9, adult: Secondary | ICD-10-CM | POA: Diagnosis not present

## 2020-12-09 DIAGNOSIS — E7849 Other hyperlipidemia: Secondary | ICD-10-CM | POA: Diagnosis not present

## 2020-12-09 DIAGNOSIS — R7303 Prediabetes: Secondary | ICD-10-CM | POA: Diagnosis not present

## 2020-12-09 DIAGNOSIS — I10 Essential (primary) hypertension: Secondary | ICD-10-CM | POA: Diagnosis not present

## 2020-12-09 DIAGNOSIS — E559 Vitamin D deficiency, unspecified: Secondary | ICD-10-CM

## 2020-12-09 NOTE — Progress Notes (Signed)
Chief Complaint:   OBESITY Richard [name] is here to discuss his progress with his obesity treatment plan along with follow-up of his obesity related diagnoses. Richard [name] is on the Category 2 Plan and states he is following his eating plan approximately 80% of the time. Richard [name] states he is using his treadmill for 20 minutes 2 times per week.  Today's visit was #: 12 Starting weight: 258 lbs Starting date: 04/08/2020 Today's weight: 230 lbs Today's date: 12/09/2020 Total lbs lost to date: 28 lbs Total lbs lost since last in-office visit: 3 lbs  Interim History: [name] is down an additional 3 lbs and doing well overall.  Subjective:   1. Essential hypertension Richard [name]' blood pressure is controlled.   2. Other hyperlipidemia Richard [name] is currently taking Lipitor. He denies myalgias.   3. Pre-diabetes Richard [name] is currently not on medications.  4. Vitamin D deficiency Richard [name] is currently taking Vitamin D.  Assessment/Plan:   1. Essential hypertension Richard [name] is working on healthy weight loss and exercise to improve blood pressure control. Richard [name] will continue medications. We will watch for signs of hypotension as he continues his lifestyle modifications.  - Lipid Panel With LDL/HDL Ratio - Comprehensive metabolic panel  2. Other hyperlipidemia Cardiovascular risk and specific lipid/LDL goals reviewed.  We discussed several lifestyle modifications today and Richard [name] will continue to work on diet, exercise and weight loss efforts. Richard [name] will continue Lipitor. Orders and follow up as documented in patient record.   Counseling Intensive lifestyle modifications are the first line treatment for this issue. Dietary changes: Increase soluble fiber. Decrease simple carbohydrates. Exercise changes: Moderate to vigorous-intensity aerobic activity 150 minutes per week if tolerated. Lipid-lowering medications: see documented in medical record.  - Lipid Panel With LDL/HDL Ratio - Comprehensive metabolic  panel  3. Pre-diabetes [name] will continue to work on weight loss, exercise, and decreasing simple carbohydrates to help decrease the risk of diabetes. [name] will continue to work on plan and exercise.  - Hemoglobin A1c  4. Vitamin D deficiency Low Vitamin D level contributes to fatigue and are associated with obesity, breast, and colon cancer. Richard [name] agrees to continue to take prescription Vitamin D 50,000 IU every week and he will follow-up for routine testing of Vitamin D, at least 2-3 times per year to avoid over-replacement.  - VITAMIN D 25 Hydroxy (Vit-D Deficiency, Fractures)  5. Obesity with current BMI of 35 Richard [name] is currently in the action stage of change. As such, his goal is to continue with weight loss efforts. He has agreed to the Category 2 Plan.   Richard [name] will continue meal planning. He will continue to adhere closely to the plan.  Exercise goals:  Richard [name] will continue to treadmill and play golf.  Behavioral modification strategies: increasing lean protein intake, decreasing simple carbohydrates, increasing vegetables, increasing water intake, decreasing eating out, no skipping meals, meal planning and cooking strategies, keeping healthy foods in the home, and planning for success.  Richard [name] has agreed to follow-up with our clinic in 3-4 weeks. He was informed of the importance of frequent follow-up visits to maximize his success with intensive lifestyle modifications for his multiple health conditions.   Richard [name] was informed we would discuss his lab results at his next visit unless there is a critical issue that needs to be addressed sooner. Richard [name] agreed to keep his next visit at the agreed upon time to discuss these results.  Objective:   Blood pressure 130/72, pulse 70, temperature 98.2 F (36.8 C), height 5\' 8"  (1.727  m), weight 230 lb (104.3 kg), SpO2 100 %. Body mass index is 34.97 kg/m.  General: Cooperative, alert, well developed, in no acute distress. HEENT:  Conjunctivae and lids unremarkable. Cardiovascular: Regular rhythm.  Lungs: Normal work of breathing. Neurologic: No focal deficits.   Lab Results  Component Value Date   CREATININE 0.84 07/24/2020   BUN 14 07/24/2020   NA 131 (L) 07/24/2020   K 3.5 07/24/2020   CL 91 (L) 07/24/2020   CO2 22 07/24/2020   Lab Results  Component Value Date   ALT 15 07/24/2020   AST 27 07/24/2020   ALKPHOS 103 07/24/2020   BILITOT 0.5 07/24/2020   Lab Results  Component Value Date   HGBA1C 6.3 (H) 07/24/2020   HGBA1C 6.2 (H) 04/08/2020   HGBA1C 6.1 (H) 04/30/2013   Lab Results  Component Value Date   INSULIN 12.3 07/24/2020   INSULIN 5.3 04/08/2020   Lab Results  Component Value Date   TSH 0.922 04/08/2020   Lab Results  Component Value Date   CHOL 137 07/24/2020   HDL 61 07/24/2020   LDLCALC 62 07/24/2020   TRIG 69 07/24/2020   CHOLHDL 2.8 07/26/2019   Lab Results  Component Value Date   VD25OH 53.6 07/24/2020   VD25OH 60.0 04/08/2020   Lab Results  Component Value Date   WBC 5.3 05/18/2017   HGB 14.4 05/18/2017   HCT 43.2 05/18/2017   MCV 86 05/18/2017   PLT 248 05/18/2017   No results found for: IRON, TIBC, FERRITIN  Attestation Statements:   Reviewed by clinician on day of visit: allergies, medications, problem list, medical history, surgical history, family history, social history, and previous encounter notes.  I, Richard [name], RMA, am acting as Location manager for [employer], DO.   I have reviewed the above documentation for accuracy and completeness, and I agree with the above. Richard Lesch, DO

## 2020-12-10 ENCOUNTER — Other Ambulatory Visit (HOSPITAL_COMMUNITY): Payer: Self-pay

## 2020-12-10 LAB — COMPREHENSIVE METABOLIC PANEL
ALT: 13 IU/L (ref 0–44)
AST: 22 IU/L (ref 0–40)
Albumin/Globulin Ratio: 2 (ref 1.2–2.2)
Albumin: 4.7 g/dL (ref 3.7–4.7)
Alkaline Phosphatase: 124 IU/L — ABNORMAL HIGH (ref 44–121)
BUN/Creatinine Ratio: 14 (ref 10–24)
BUN: 13 mg/dL (ref 8–27)
Bilirubin Total: 0.6 mg/dL (ref 0.0–1.2)
CO2: 24 mmol/L (ref 20–29)
Calcium: 9.5 mg/dL (ref 8.6–10.2)
Chloride: 89 mmol/L — ABNORMAL LOW (ref 96–106)
Creatinine, Ser: 0.92 mg/dL (ref 0.76–1.27)
Globulin, Total: 2.4 g/dL (ref 1.5–4.5)
Glucose: 109 mg/dL — ABNORMAL HIGH (ref 70–99)
Potassium: 3.7 mmol/L (ref 3.5–5.2)
Sodium: 129 mmol/L — ABNORMAL LOW (ref 134–144)
Total Protein: 7.1 g/dL (ref 6.0–8.5)
eGFR: 87 mL/min/{1.73_m2} (ref >59)

## 2020-12-10 LAB — VITAMIN D 25 HYDROXY (VIT D DEFICIENCY, FRACTURES): Vit D, 25-Hydroxy: 72.2 ng/mL (ref 30.0–100.0)

## 2020-12-10 LAB — HEMOGLOBIN A1C
Est. average glucose Bld gHb Est-mCnc: 126 mg/dL
Hgb A1c MFr Bld: 6 % — ABNORMAL HIGH (ref 4.8–5.6)

## 2020-12-10 LAB — LIPID PANEL WITH LDL/HDL RATIO
Cholesterol, Total: 157 mg/dL (ref 100–199)
HDL: 63 mg/dL (ref >39)
LDL Chol Calc (NIH): 79 mg/dL (ref 0–99)
LDL/HDL Ratio: 1.3 ratio (ref 0.0–3.6)
Triglycerides: 78 mg/dL (ref 0–149)
VLDL Cholesterol Cal: 15 mg/dL (ref 5–40)

## 2020-12-10 MED ORDER — ATORVASTATIN CALCIUM 80 MG PO TABS
80.0000 mg | ORAL_TABLET | Freq: Every day | ORAL | 4 refills | Status: DC
  Filled 2020-12-10: qty 90, 90d supply, fill #0
  Filled 2021-03-11: qty 90, 90d supply, fill #1
  Filled 2021-06-08: qty 90, 90d supply, fill #2
  Filled 2021-09-07: qty 90, 90d supply, fill #3

## 2020-12-30 ENCOUNTER — Encounter (INDEPENDENT_AMBULATORY_CARE_PROVIDER_SITE_OTHER): Payer: Self-pay | Admitting: Bariatrics

## 2020-12-30 ENCOUNTER — Ambulatory Visit (INDEPENDENT_AMBULATORY_CARE_PROVIDER_SITE_OTHER): Payer: PPO | Admitting: Bariatrics

## 2020-12-30 ENCOUNTER — Other Ambulatory Visit: Payer: Self-pay

## 2020-12-30 VITALS — BP 132/75 | HR 80 | Temp 97.6°F | Ht 68.0 in | Wt 229.0 lb

## 2020-12-30 DIAGNOSIS — E559 Vitamin D deficiency, unspecified: Secondary | ICD-10-CM

## 2020-12-30 DIAGNOSIS — E7849 Other hyperlipidemia: Secondary | ICD-10-CM | POA: Diagnosis not present

## 2020-12-30 DIAGNOSIS — Z6839 Body mass index (BMI) 39.0-39.9, adult: Secondary | ICD-10-CM

## 2020-12-30 DIAGNOSIS — I10 Essential (primary) hypertension: Secondary | ICD-10-CM | POA: Diagnosis not present

## 2020-12-30 NOTE — Progress Notes (Addendum)
Chief Complaint:   OBESITY Richard [name] is here to discuss his progress with his obesity treatment plan along with follow-up of his obesity related diagnoses. Richard [name] is on the Category 2 Plan and states he is following his eating plan approximately 80% of the time. Richard [name] states he is walking on treadmill 20 minutes 2 times per week.  Today's visit was #: 13 Starting weight: 258 lbs Starting date: 04/08/2020 Today's weight: 229 lbs Today's date: 12/30/2020 Total lbs lost to date: 29 Total lbs lost since last in-office visit: 1  Interim History: Richard [name] is down 1 additional pound since his last visit.  Subjective:   1. Primary hypertension Pt's BP is controlled on medication.  2. Other hyperlipidemia Jaiceon is taking Lipitor as directed.  3. Vitamin D deficiency Pt's last Vit D level was 72.2.  Assessment/Plan:   1. Primary hypertension Richard [name] is working on healthy weight loss and exercise to improve blood pressure control. We will watch for signs of hypotension as he continues his lifestyle modifications. Continue current treatment plan.  2. Other hyperlipidemia Cardiovascular risk and specific lipid/LDL goals reviewed.  We discussed several lifestyle modifications today and Richard [name] will continue to work on diet, exercise and weight loss efforts. Orders and follow up as documented in patient record. Continue Lipitor.  Counseling Intensive lifestyle modifications are the first line treatment for this issue. Dietary changes: Increase soluble fiber. Decrease simple carbohydrates. Exercise changes: Moderate to vigorous-intensity aerobic activity 150 minutes per week if tolerated. Lipid-lowering medications: see documented in medical record.  3. Vitamin D deficiency Low Vitamin D level contributes to fatigue and are associated with obesity, breast, and colon cancer. He agrees to finish taking prescription Vitamin D that he has, then convert to OTC Vit D 2,000 IU QD. He will follow-up for  routine testing of Vitamin D, at least 2-3 times per year to avoid over-replacement.  4. Obesity with current BMI of 34.8  Richard [name] is currently in the action stage of change. As such, his goal is to continue with weight loss efforts. He has agreed to the Category 2 Plan.   Meal planning. Intentional eating. 12/09/20 labs reviewed.( CMP, Lipids,  vitamin D , HgbA1c and insulin ).  Tips for holiday eating provided.  Exercise goals:  As is- Some issues with breathing while playing golf.  Behavioral modification strategies: increasing lean protein intake, decreasing simple carbohydrates, increasing vegetables, increasing water intake, decreasing eating out, no skipping meals, meal planning and cooking strategies, keeping healthy foods in the home, and planning for success.  Richard [name] has agreed to follow-up with our clinic in 3-4 weeks. He was informed of the importance of frequent follow-up visits to maximize his success with intensive lifestyle modifications for his multiple health conditions.   Objective:   Blood pressure 132/75, pulse 80, temperature 97.6 F (36.4 C), height 5\' 8"  (1.727 m), weight 229 lb (103.9 kg), SpO2 98 %. Body mass index is 34.82 kg/m.  General: Cooperative, alert, well developed, in no acute distress. HEENT: Conjunctivae and lids unremarkable. Cardiovascular: Regular rhythm.  Lungs: Normal work of breathing. Neurologic: No focal deficits.   Lab Results  Component Value Date   CREATININE 0.92 12/09/2020   BUN 13 12/09/2020   NA 129 (L) 12/09/2020   K 3.7 12/09/2020   CL 89 (L) 12/09/2020   CO2 24 12/09/2020   Lab Results  Component Value Date   ALT 13 12/09/2020   AST 22 12/09/2020   ALKPHOS 124 (H) 12/09/2020   BILITOT  0.6 12/09/2020   Lab Results  Component Value Date   HGBA1C 6.0 (H) 12/09/2020   HGBA1C 6.3 (H) 07/24/2020   HGBA1C 6.2 (H) 04/08/2020   HGBA1C 6.1 (H) 04/30/2013   Lab Results  Component Value Date   INSULIN 12.3 07/24/2020    INSULIN 5.3 04/08/2020   Lab Results  Component Value Date   TSH 0.922 04/08/2020   Lab Results  Component Value Date   CHOL 157 12/09/2020   HDL 63 12/09/2020   LDLCALC 79 12/09/2020   TRIG 78 12/09/2020   CHOLHDL 2.8 07/26/2019   Lab Results  Component Value Date   VD25OH 72.2 12/09/2020   VD25OH 53.6 07/24/2020   VD25OH 60.0 04/08/2020   Lab Results  Component Value Date   WBC 5.3 05/18/2017   HGB 14.4 05/18/2017   HCT 43.2 05/18/2017   MCV 86 05/18/2017   PLT 248 05/18/2017    Attestation Statements:   Reviewed by clinician on day of visit: allergies, medications, problem list, medical history, surgical history, family history, social history, and previous encounter notes.  Coral Ceo, CMA, am acting as Location manager for [employer], DO.  I have reviewed the above documentation for accuracy and completeness, and I agree with the above. Jearld Lesch, DO

## 2020-12-31 ENCOUNTER — Encounter (INDEPENDENT_AMBULATORY_CARE_PROVIDER_SITE_OTHER): Payer: Self-pay | Admitting: Bariatrics

## 2021-01-05 ENCOUNTER — Other Ambulatory Visit: Payer: Self-pay | Admitting: Interventional Cardiology

## 2021-01-05 MED FILL — Ipratropium-Albuterol Nebu Soln 0.5-2.5(3) MG/3ML: RESPIRATORY_TRACT | 30 days supply | Qty: 180 | Fill #6 | Status: AC

## 2021-01-06 ENCOUNTER — Other Ambulatory Visit (HOSPITAL_COMMUNITY): Payer: Self-pay

## 2021-01-06 MED ORDER — AMLODIPINE BESYLATE 5 MG PO TABS
5.0000 mg | ORAL_TABLET | Freq: Every day | ORAL | 3 refills | Status: DC
  Filled 2021-01-06: qty 90, 90d supply, fill #0
  Filled 2021-04-10: qty 90, 90d supply, fill #1
  Filled 2021-07-08: qty 90, 90d supply, fill #2
  Filled 2021-09-26: qty 90, 90d supply, fill #3

## 2021-01-07 ENCOUNTER — Other Ambulatory Visit (HOSPITAL_COMMUNITY): Payer: Self-pay

## 2021-01-07 MED ORDER — CARVEDILOL 12.5 MG PO TABS
12.5000 mg | ORAL_TABLET | Freq: Two times a day (BID) | ORAL | 0 refills | Status: DC
Start: 2021-01-07 — End: 2021-03-14
  Filled 2021-01-07: qty 180, 90d supply, fill #0

## 2021-01-14 ENCOUNTER — Other Ambulatory Visit (HOSPITAL_COMMUNITY): Payer: Self-pay

## 2021-01-14 DIAGNOSIS — H524 Presbyopia: Secondary | ICD-10-CM | POA: Diagnosis not present

## 2021-01-14 DIAGNOSIS — H25013 Cortical age-related cataract, bilateral: Secondary | ICD-10-CM | POA: Diagnosis not present

## 2021-01-14 MED FILL — Tamsulosin HCl Cap 0.4 MG: ORAL | 90 days supply | Qty: 90 | Fill #2 | Status: AC

## 2021-01-15 ENCOUNTER — Other Ambulatory Visit (HOSPITAL_COMMUNITY): Payer: Self-pay

## 2021-01-16 ENCOUNTER — Other Ambulatory Visit (HOSPITAL_COMMUNITY): Payer: Self-pay

## 2021-01-16 MED ORDER — OMEPRAZOLE 40 MG PO CPDR
40.0000 mg | DELAYED_RELEASE_CAPSULE | Freq: Two times a day (BID) | ORAL | 4 refills | Status: DC
  Filled 2021-01-16: qty 180, 90d supply, fill #0
  Filled 2021-07-28: qty 180, 90d supply, fill #1
  Filled 2022-01-12: qty 180, 90d supply, fill #2

## 2021-01-16 MED ORDER — POTASSIUM CHLORIDE ER 10 MEQ PO CPCR
10.0000 meq | ORAL_CAPSULE | Freq: Every day | ORAL | 4 refills | Status: DC
  Filled 2021-01-16: qty 30, 30d supply, fill #0

## 2021-01-16 MED ORDER — EZETIMIBE 10 MG PO TABS
10.0000 mg | ORAL_TABLET | Freq: Every day | ORAL | 4 refills | Status: DC
  Filled 2021-01-16: qty 90, 90d supply, fill #0
  Filled 2021-04-24: qty 90, 90d supply, fill #1
  Filled 2021-07-26: qty 90, 90d supply, fill #2
  Filled 2021-10-23: qty 90, 90d supply, fill #3

## 2021-02-04 ENCOUNTER — Ambulatory Visit (INDEPENDENT_AMBULATORY_CARE_PROVIDER_SITE_OTHER): Payer: PPO | Admitting: Bariatrics

## 2021-02-04 ENCOUNTER — Other Ambulatory Visit: Payer: Self-pay

## 2021-02-04 ENCOUNTER — Encounter (INDEPENDENT_AMBULATORY_CARE_PROVIDER_SITE_OTHER): Payer: Self-pay | Admitting: Bariatrics

## 2021-02-04 VITALS — BP 124/77 | HR 71 | Temp 97.8°F | Ht 68.0 in | Wt 228.0 lb

## 2021-02-04 DIAGNOSIS — R7303 Prediabetes: Secondary | ICD-10-CM

## 2021-02-04 DIAGNOSIS — E7849 Other hyperlipidemia: Secondary | ICD-10-CM | POA: Diagnosis not present

## 2021-02-04 DIAGNOSIS — Z6839 Body mass index (BMI) 39.0-39.9, adult: Secondary | ICD-10-CM

## 2021-02-04 NOTE — Progress Notes (Signed)
Chief Complaint:   OBESITY Richard [name] is here to discuss his progress with his obesity treatment plan along with follow-up of his obesity related diagnoses. Richard [name] is on the Category 2 Plan and states he is following his eating plan approximately 70% of the time. Richard [name] states he is doing 0 minutes 0 times per week.  Today's visit was #: 14 Starting weight: 258 lbs Starting date: 04/08/2020 Today's weight: 228 lbs Today's date: 02/04/2021 Total lbs lost to date: 30 lbs Total lbs lost since last in-office visit: 1 lb  Interim History: Richard [name] is down 1 lb over the holidays and doing well overall. He ate small portions and got in his protein.  Subjective:   1. Other hyperlipidemia Richard [name] is currently taking Lipitor and Zetia.  2. Pre-diabetes Richard [name] is not on medications currently.  Assessment/Plan:   1. Other hyperlipidemia Cardiovascular risk and specific lipid/LDL goals reviewed.  We discussed several lifestyle modifications today and Richard [name] will continue to work on diet, exercise and weight loss efforts. Richard [name] will continue taking Lipitor and Zetia. Orders and follow up as documented in patient record.   Counseling Intensive lifestyle modifications are the first line treatment for this issue. Dietary changes: Increase soluble fiber. Decrease simple carbohydrates. Exercise changes: Moderate to vigorous-intensity aerobic activity 150 minutes per week if tolerated. Lipid-lowering medications: see documented in medical record.   2. Pre-diabetes Richard [name] will continue to follow the plan and he will be as active as possible with walking and golf. He will continue to work on weight loss, exercise, and decreasing simple carbohydrates to help decrease the risk of diabetes.   3. Obesity with current BMI of 34.8 Richard [name] is currently in the action stage of change. As such, his goal is to continue with weight loss efforts. He has agreed to the Category 2 Plan.   Richard [name] will continue meal planning  and he will continue intentional eating. He will continue with his water intake.  Exercise goals:  Richard [name] will be going to the gym.  Behavioral modification strategies: increasing lean protein intake, decreasing simple carbohydrates, increasing vegetables, increasing water intake, decreasing eating out, no skipping meals, meal planning and cooking strategies, keeping healthy foods in the home, and planning for success.  Richard [name] has agreed to follow-up with our clinic in 6 weeks. He was informed of the importance of frequent follow-up visits to maximize his success with intensive lifestyle modifications for his multiple health conditions.   Objective:   Blood pressure 124/77, pulse 71, temperature 97.8 F (36.6 C), height 5\' 8"  (1.727 m), weight 228 lb (103.4 kg), SpO2 98 %. Body mass index is 34.67 kg/m.  General: Cooperative, alert, well developed, in no acute distress. HEENT: Conjunctivae and lids unremarkable. Cardiovascular: Regular rhythm.  Lungs: Normal work of breathing. Neurologic: No focal deficits.   Lab Results  Component Value Date   CREATININE 0.92 12/09/2020   BUN 13 12/09/2020   NA 129 (L) 12/09/2020   K 3.7 12/09/2020   CL 89 (L) 12/09/2020   CO2 24 12/09/2020   Lab Results  Component Value Date   ALT 13 12/09/2020   AST 22 12/09/2020   ALKPHOS 124 (H) 12/09/2020   BILITOT 0.6 12/09/2020   Lab Results  Component Value Date   HGBA1C 6.0 (H) 12/09/2020   HGBA1C 6.3 (H) 07/24/2020   HGBA1C 6.2 (H) 04/08/2020   HGBA1C 6.1 (H) 04/30/2013   Lab Results  Component Value Date   INSULIN 12.3 07/24/2020   INSULIN 5.3 04/08/2020  Lab Results  Component Value Date   TSH 0.922 04/08/2020   Lab Results  Component Value Date   CHOL 157 12/09/2020   HDL 63 12/09/2020   LDLCALC 79 12/09/2020   TRIG 78 12/09/2020   CHOLHDL 2.8 07/26/2019   Lab Results  Component Value Date   VD25OH 72.2 12/09/2020   VD25OH 53.6 07/24/2020   VD25OH 60.0 04/08/2020   Lab  Results  Component Value Date   WBC 5.3 05/18/2017   HGB 14.4 05/18/2017   HCT 43.2 05/18/2017   MCV 86 05/18/2017   PLT 248 05/18/2017   No results found for: IRON, TIBC, FERRITIN  Attestation Statements:   Reviewed by clinician on day of visit: allergies, medications, problem list, medical history, surgical history, family history, social history, and previous encounter notes.  I, Lizbeth Bark, RMA, am acting as Location manager for [employer], DO.   I have reviewed the above documentation for accuracy and completeness, and I agree with the above. Jearld Lesch, DO

## 2021-02-11 ENCOUNTER — Other Ambulatory Visit (HOSPITAL_COMMUNITY): Payer: Self-pay

## 2021-02-11 DIAGNOSIS — E049 Nontoxic goiter, unspecified: Secondary | ICD-10-CM | POA: Diagnosis not present

## 2021-02-11 DIAGNOSIS — J441 Chronic obstructive pulmonary disease with (acute) exacerbation: Secondary | ICD-10-CM | POA: Diagnosis not present

## 2021-02-11 DIAGNOSIS — J4530 Mild persistent asthma, uncomplicated: Secondary | ICD-10-CM | POA: Diagnosis not present

## 2021-02-11 DIAGNOSIS — E876 Hypokalemia: Secondary | ICD-10-CM | POA: Diagnosis not present

## 2021-02-11 DIAGNOSIS — Z79899 Other long term (current) drug therapy: Secondary | ICD-10-CM | POA: Diagnosis not present

## 2021-02-11 DIAGNOSIS — I119 Hypertensive heart disease without heart failure: Secondary | ICD-10-CM | POA: Diagnosis not present

## 2021-02-11 DIAGNOSIS — E7800 Pure hypercholesterolemia, unspecified: Secondary | ICD-10-CM | POA: Diagnosis not present

## 2021-02-11 DIAGNOSIS — D638 Anemia in other chronic diseases classified elsewhere: Secondary | ICD-10-CM | POA: Diagnosis not present

## 2021-02-11 DIAGNOSIS — I5030 Unspecified diastolic (congestive) heart failure: Secondary | ICD-10-CM | POA: Diagnosis not present

## 2021-02-11 DIAGNOSIS — R0600 Dyspnea, unspecified: Secondary | ICD-10-CM | POA: Diagnosis not present

## 2021-02-11 DIAGNOSIS — E559 Vitamin D deficiency, unspecified: Secondary | ICD-10-CM | POA: Diagnosis not present

## 2021-02-11 DIAGNOSIS — I2510 Atherosclerotic heart disease of native coronary artery without angina pectoris: Secondary | ICD-10-CM | POA: Diagnosis not present

## 2021-02-11 MED ORDER — POTASSIUM CHLORIDE ER 10 MEQ PO CPCR
10.0000 meq | ORAL_CAPSULE | Freq: Every day | ORAL | 4 refills | Status: DC
  Filled 2021-02-11: qty 30, 30d supply, fill #0
  Filled 2021-03-18: qty 30, 30d supply, fill #1
  Filled 2021-04-14: qty 30, 30d supply, fill #2
  Filled 2021-05-18: qty 30, 30d supply, fill #3
  Filled 2021-06-17: qty 30, 30d supply, fill #4

## 2021-02-11 MED ORDER — ERGOCALCIFEROL 1.25 MG (50000 UT) PO CAPS
50000.0000 [IU] | ORAL_CAPSULE | ORAL | 3 refills | Status: DC
  Filled 2021-02-11: qty 4, 28d supply, fill #0
  Filled 2021-05-06: qty 4, 28d supply, fill #1
  Filled 2021-06-08: qty 4, 28d supply, fill #2
  Filled 2021-08-18: qty 4, 28d supply, fill #3

## 2021-02-11 MED ORDER — IPRATROPIUM-ALBUTEROL 0.5-2.5 (3) MG/3ML IN SOLN
3.0000 mL | Freq: Three times a day (TID) | RESPIRATORY_TRACT | 5 refills | Status: DC
  Filled 2021-02-11: qty 270, 30d supply, fill #0
  Filled 2021-03-24: qty 270, 30d supply, fill #1
  Filled 2021-05-06: qty 270, 30d supply, fill #2

## 2021-02-11 MED ORDER — ALBUTEROL SULFATE HFA 108 (90 BASE) MCG/ACT IN AERS
2.0000 | INHALATION_SPRAY | Freq: Every day | RESPIRATORY_TRACT | 3 refills | Status: DC | PRN
  Filled 2021-02-11: qty 8.5, 90d supply, fill #0

## 2021-02-11 MED ORDER — TAMSULOSIN HCL 0.4 MG PO CAPS
0.4000 mg | ORAL_CAPSULE | Freq: Every day | ORAL | 3 refills | Status: DC
  Filled 2021-02-11 – 2021-04-11 (×2): qty 90, 90d supply, fill #0
  Filled 2021-07-08: qty 90, 90d supply, fill #1
  Filled 2021-09-26: qty 90, 90d supply, fill #2
  Filled 2022-01-01: qty 90, 90d supply, fill #3

## 2021-02-11 MED ORDER — FLUTICASONE FUROATE-VILANTEROL 200-25 MCG/ACT IN AEPB
1.0000 | INHALATION_SPRAY | Freq: Every day | RESPIRATORY_TRACT | 3 refills | Status: DC
  Filled 2021-02-11: qty 60, 30d supply, fill #0
  Filled 2021-02-11: qty 180, 90d supply, fill #0
  Filled 2021-03-24: qty 180, 90d supply, fill #1
  Filled 2021-06-30: qty 180, 90d supply, fill #2

## 2021-03-12 ENCOUNTER — Other Ambulatory Visit (HOSPITAL_COMMUNITY): Payer: Self-pay

## 2021-03-13 NOTE — Progress Notes (Signed)
Cardiology Office Note:    Date:  03/14/2021   ID:  Richard [name], DOB 1945/09/07, MRN [id number]  PCP:  Vincente Liberty, MD  Cardiologist:  Fransico Him, MD   Referring MD: Vincente Liberty, MD   No chief complaint on file.   History of Present Illness:    Richard [name] is a 76 y.o. 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   Is concerned about progressive decreased memory.  This was also pointed out to him by his daughter.  Significant dyspnea on exertion is his limiting complaint.   He denies angina.  He denies orthopnea and swelling.  He is not as active as he would be because of dyspnea.  Past Medical History:  Diagnosis Date   Acid reflux disease    Arthritis    Asthma    COPD (chronic obstructive pulmonary disease) (HCC)    Edema, lower extremity    Heart failure, diastolic (HCC)    High cholesterol    Hypertension    Myocardial infarction [place]) 2004   Dr. Tamala Julian, at [place], yearly visits   OSA treated with BiPAP 04/24/2018   Mild OSA with an AHI of 13.5/hr and O2 sats of 88%. Now on BiPAP at 13/9cm H2O.    Sleep apnea    Vitamin D deficiency     Past Surgical History:  Procedure Laterality Date   CARDIAC CATHETERIZATION  2004   with 2 stents   CARDIOVASCULAR STRESS TEST  2011   COLONOSCOPY     POLYPECTOMY  02/17/2011   Procedure: POLYPECTOMY NASAL;  Surgeon: Delsa Bern;  Location: [facility] OR;  Service: ENT;  Laterality: N/A;   SINUS ENDO W/FUSION  02/17/2011   Procedure: ENDOSCOPIC SINUS SURGERY WITH FUSION NAVIGATION;  Surgeon: Delsa Bern;  Location: [facility] OR;  Service: ENT;  Laterality: N/A;   TONSILLECTOMY     US ECHOCARDIOGRAPHY      Current Medications: Current Meds  Medication Sig   amLODipine (NORVASC) 5 MG tablet Take 1 tablet (5 mg total) by mouth daily.   aspirin EC 81 MG tablet Take 1 tablet (81 mg  total) by mouth daily.   atorvastatin (LIPITOR) 80 MG tablet Take 1 tablet (80 mg total) by mouth at bedtime.   chlorthalidone (HYGROTON) 50 MG tablet Take 1 tablet (50 mg total) by mouth daily.   COVID-19 mRNA Vac-TriS, Pfizer, (PFIZER-BIONT COVID-19 VAC-TRIS) SUSP injection Inject into the muscle.   ergocalciferol (VITAMIN D2) 1.25 MG (50000 UT) capsule Take 1 capsule (50,000 Units total) by mouth once a week.   ezetimibe (ZETIA) 10 MG tablet Take 1 tablet (10 mg total) by mouth daily.   fluticasone furoate-vilanterol (BREO ELLIPTA) 200-25 MCG/ACT AEPB Inhale 1 puff into the lungs daily.   fluticasone furoate-vilanterol (BREO ELLIPTA) 200-25 MCG/INH AEPB Inhale 1 puff into the lungs daily.   guaiFENesin (MUCINEX) 600 MG 12 hr tablet Take by mouth 2 (two) times daily.   ipratropium-albuterol (DUONEB) 0.5-2.5 (3) MG/3ML SOLN USE 1 VIAL VIA NEBULIZER 2 TIMES DAILY & AT BEDTIME IF NEEDED   montelukast (SINGULAIR) 10 MG tablet Take 1 tablet (10 mg total) by mouth daily.   potassium chloride (MICRO-K) 10 MEQ CR capsule Take 1 capsule (10 mEq total) by mouth daily.   tamsulosin (FLOMAX) 0.4 MG CAPS capsule Take 1 capsule (0.4 mg total) by mouth daily.   [DISCONTINUED]  carvedilol (COREG) 12.5 MG tablet Take 1 tablet (12.5 mg total) by mouth 2 (two) times daily.   [DISCONTINUED] nitroGLYCERIN (NITROSTAT) 0.4 MG SL tablet Place 1 tablet (0.4 mg total) under the tongue every 5 (five) minutes as needed. For chest pain     Allergies:   Beef-derived products, Chicken protein, Lisinopril, and Peanut-containing drug products   Social History   Socioeconomic History   Marital status: Married    Spouse name: [place]   Number of children: Not on file   Years of education: Not on file   Highest education level: Not on file  Occupational History   Not on file  Tobacco Use   Smoking status: Former   Smokeless tobacco: Never  Vaping Use   Vaping Use: Never used  Substance and Sexual Activity   Alcohol  use: Yes    Alcohol/week: 6.0 standard drinks    Types: 2 Cans of beer, 4 Standard drinks or equivalent per week    Comment: 5-6 drinks/week   Drug use: No   Sexual activity: Not on file  Other Topics Concern   Not on file  Social History Narrative   Not on file   Social Determinants of Health   Financial Resource Strain: Not on file  Food Insecurity: Not on file  Transportation Needs: Not on file  Physical Activity: Not on file  Stress: Not on file  Social Connections: Not on file     Family History: The patient's family history includes Alzheimer's disease in his mother; Cancer in his mother; Diabetes in his father and sister; Heart attack in his father; Hypertension in his father and sister; Stroke in his mother.  ROS:   Please see the history of present illness.    Family history of Alzheimer's.  Participates in the healthy weight loss program.  Daughter and wife have been concerned about memory loss.  All other systems reviewed and are negative.  EKGs/Labs/Other Studies Reviewed:    The following studies were reviewed today:  Echocardiogram 2019: Study Conclusions   - Left ventricle: The cavity size was normal. There was moderate    concentric hypertrophy. Systolic function was normal. The    estimated ejection fraction was in the range of 55% to 60%. Wall    motion was normal; there were no regional wall motion    abnormalities. Doppler parameters are consistent with abnormal    left ventricular relaxation (grade 1 diastolic dysfunction).    Doppler parameters are consistent with indeterminate ventricular    filling pressure.  - Aortic valve: Transvalvular velocity was within the normal range.    There was no stenosis. There was no regurgitation.  - Mitral valve: Transvalvular velocity was within the normal range.    There was no evidence for stenosis. There was trivial    regurgitation.  - Right ventricle: The cavity size was normal. Wall thickness was     normal.  - Atrial septum: No defect or patent foramen ovale was identified    by color flow Doppler.  - Tricuspid valve: There was trivial regurgitation.  - Pulmonary arteries: Systolic pressure was within the normal    range.  - Global longitudinal strain -19.0% (normal).   Stress test 2019: Study Highlights    There was no ST segment deviation noted during stress. The study is normal. This is a low risk study.   Normal pharmacologic nuclear study with no evidence for prior infarct or ischemia.   EKG:  EKG right bundle branch block, left  axis deviation, borderline first-degree AV block.  Compared to January 2022, significant changes noted.  Recent Labs: 04/08/2020: TSH 0.922 12/09/2020: ALT 13; BUN 13; Creatinine, Ser 0.92; Potassium 3.7; Sodium 129  Recent Lipid Panel    Component Value Date/Time   CHOL 157 12/09/2020 1046   TRIG 78 12/09/2020 1046   HDL 63 12/09/2020 1046   CHOLHDL 2.8 07/26/2019 1142   LDLCALC 79 12/09/2020 1046    Physical Exam:    VS:  BP (!) 102/58    Pulse 75    Ht 5\' 8"  (1.727 m)    Wt 233 lb (105.7 kg)    SpO2 99%    BMI 35.43 kg/m     Wt Readings from Last 3 Encounters:  03/14/21 233 lb (105.7 kg)  02/04/21 228 lb (103.4 kg)  12/30/20 229 lb (103.9 kg)     GEN: Obese.. No acute distress HEENT: Normal NECK: No JVD. LYMPHATICS: No lymphadenopathy CARDIAC: No murmur. RRR no gallop, or edema. VASCULAR:  Normal Pulses. No bruits. RESPIRATORY:  Clear to auscultation without rales, wheezing or rhonchi  ABDOMEN: Soft, non-tender, non-distended, No pulsatile mass, MUSCULOSKELETAL: No deformity  SKIN: Warm and dry NEUROLOGIC:  Alert and oriented x 3 PSYCHIATRIC:  Normal affect   ASSESSMENT:    1. Dyspnea on exertion   2. CAD in native artery   3. Chronic diastolic heart failure ([place])   4. Memory loss   5. Primary hypertension   6. OSA treated with BiPAP   7. Morbid obesity ([place])   8. Hyperlipidemia, unspecified hyperlipidemia type    9. Right bundle branch block    PLAN:    In order of problems listed above:  Diastolic dysfunction on echo, obesity, sleep apnea, likely all contributing.  Suspect diastolic heart failure and since we can now treated with SGLT2, will give a trial of Farxiga or Jardiance 10 mg/day. Secondary prevention discussed. See note above and referenced echocardiogram from 3 years ago. Neurology consultation given family history of early Alzheimer's disease. Excellent control on current regimen which includes high-grade tone, carvedilol, amlodipine, and salt restriction. 50% compliant with CPAP.  Asked him to significantly increase CPAP use. As noted above he is in the healthy weight loss program and has lost weight. Continue high intensity statin therapy Unchanged   Overall education and awareness concerning primary/secondary risk prevention was discussed in detail: LDL less than 70, hemoglobin A1c less than 7, blood pressure target less than 130/80 mmHg, >150 minutes of moderate aerobic activity per week, avoidance of smoking, weight control (via diet and exercise), and continued surveillance/management of/for obstructive sleep apnea.    Medication Adjustments/Labs and Tests Ordered: Current medicines are reviewed at length with the patient today.  Concerns regarding medicines are outlined above.  Orders Placed This Encounter  Procedures   EKG 12-Lead   Meds ordered this encounter  Medications   carvedilol (COREG) 12.5 MG tablet    Sig: Take 1 tablet (12.5 mg total) by mouth 2 (two) times daily.    Dispense:  180 tablet    Refill:  0    change to #180 tabs per MD 01/07/21   nitroGLYCERIN (NITROSTAT) 0.4 MG SL tablet    Sig: Place 1 tablet (0.4 mg total) under the tongue every 5 (five) minutes as needed. For chest pain    Dispense:  25 tablet    Refill:  5    There are no Patient Instructions on file for this visit.   Signed, Sinclair Grooms, MD  03/14/2021 10:41 AM    [facility]

## 2021-03-14 ENCOUNTER — Other Ambulatory Visit: Payer: Self-pay

## 2021-03-14 ENCOUNTER — Encounter: Payer: Self-pay | Admitting: Interventional Cardiology

## 2021-03-14 ENCOUNTER — Ambulatory Visit: Payer: PPO | Admitting: Interventional Cardiology

## 2021-03-14 ENCOUNTER — Other Ambulatory Visit (HOSPITAL_COMMUNITY): Payer: Self-pay

## 2021-03-14 VITALS — BP 102/58 | HR 75 | Ht 68.0 in | Wt 233.0 lb

## 2021-03-14 DIAGNOSIS — R0602 Shortness of breath: Secondary | ICD-10-CM | POA: Diagnosis not present

## 2021-03-14 DIAGNOSIS — R413 Other amnesia: Secondary | ICD-10-CM

## 2021-03-14 DIAGNOSIS — I2510 Atherosclerotic heart disease of native coronary artery without angina pectoris: Secondary | ICD-10-CM

## 2021-03-14 DIAGNOSIS — G4733 Obstructive sleep apnea (adult) (pediatric): Secondary | ICD-10-CM

## 2021-03-14 DIAGNOSIS — I10 Essential (primary) hypertension: Secondary | ICD-10-CM

## 2021-03-14 DIAGNOSIS — I5032 Chronic diastolic (congestive) heart failure: Secondary | ICD-10-CM | POA: Diagnosis not present

## 2021-03-14 DIAGNOSIS — E785 Hyperlipidemia, unspecified: Secondary | ICD-10-CM

## 2021-03-14 DIAGNOSIS — I4510 Unspecified right bundle-branch block: Secondary | ICD-10-CM

## 2021-03-14 MED ORDER — CARVEDILOL 12.5 MG PO TABS
12.5000 mg | ORAL_TABLET | Freq: Two times a day (BID) | ORAL | 0 refills | Status: DC
  Filled 2021-03-14 – 2021-03-18 (×2): qty 180, 90d supply, fill #0

## 2021-03-14 MED ORDER — NITROGLYCERIN 0.4 MG SL SUBL
0.4000 mg | SUBLINGUAL_TABLET | SUBLINGUAL | 5 refills | Status: DC | PRN
  Filled 2021-03-14: qty 25, 10d supply, fill #0

## 2021-03-14 MED ORDER — DAPAGLIFLOZIN PROPANEDIOL 10 MG PO TABS
10.0000 mg | ORAL_TABLET | Freq: Every day | ORAL | 11 refills | Status: DC
  Filled 2021-03-14 – 2021-04-11 (×2): qty 30, 30d supply, fill #0
  Filled 2021-05-11: qty 30, 30d supply, fill #1
  Filled 2021-06-08: qty 30, 30d supply, fill #2
  Filled 2021-07-08: qty 30, 30d supply, fill #3
  Filled 2021-08-10: qty 30, 30d supply, fill #4
  Filled 2021-09-07: qty 30, 30d supply, fill #5
  Filled 2021-10-12: qty 30, 30d supply, fill #6
  Filled 2021-11-12: qty 30, 30d supply, fill #7
  Filled 2021-12-15: qty 30, 30d supply, fill #8
  Filled 2022-01-12: qty 30, 30d supply, fill #9
  Filled 2022-02-16: qty 30, 30d supply, fill #10

## 2021-03-14 NOTE — Patient Instructions (Signed)
Medication Instructions:  START farxiga 10 mg by mouth daily. You will be given samples of this medication, take it for three weeks and if you are able to tolerate it then keep taking it.  *If you need a refill on your cardiac medications before your next appointment, please call your pharmacy*   Lab Work: BMET 2 weeks If you have labs (blood work) drawn today and your tests are completely normal, you will receive your results only by: [place] (if you have MyChart) OR A paper copy in the mail If you have any lab test that is abnormal or we need to change your treatment, we will call you to review the results.   Testing/Procedures: None today   Follow-Up: At [facility], you and your health needs are our priority.  As part of our continuing mission to provide you with exceptional heart care, we have created designated Provider Care Teams.  These Care Teams include your primary Cardiologist (physician) and Advanced Practice Providers (APPs -  Physician Assistants and Nurse Practitioners) who all work together to provide you with the care you need, when you need it.  We recommend signing up for the patient portal called "MyChart".  Sign up information is provided on this After Visit Summary.  MyChart is used to connect with patients for Virtual Visits (Telemedicine).  Patients are able to view lab/test results, encounter notes, upcoming appointments, etc.  Non-urgent messages can be sent to your provider as well.   To learn more about what you can do with MyChart, go to [email or web address].    Your next appointment:   1 year(s)  The format for your next appointment:   In Person  Provider:   Cheral Almas Tamala Julian III

## 2021-03-17 ENCOUNTER — Encounter: Payer: Self-pay | Admitting: Physician Assistant

## 2021-03-18 ENCOUNTER — Other Ambulatory Visit (HOSPITAL_COMMUNITY): Payer: Self-pay

## 2021-03-19 ENCOUNTER — Other Ambulatory Visit: Payer: Self-pay

## 2021-03-19 ENCOUNTER — Ambulatory Visit (INDEPENDENT_AMBULATORY_CARE_PROVIDER_SITE_OTHER): Payer: PPO | Admitting: Bariatrics

## 2021-03-19 ENCOUNTER — Encounter (INDEPENDENT_AMBULATORY_CARE_PROVIDER_SITE_OTHER): Payer: Self-pay | Admitting: Bariatrics

## 2021-03-19 VITALS — BP 132/86 | HR 73 | Temp 97.4°F | Ht 68.0 in | Wt 226.0 lb

## 2021-03-19 DIAGNOSIS — R7303 Prediabetes: Secondary | ICD-10-CM

## 2021-03-19 DIAGNOSIS — Z6834 Body mass index (BMI) 34.0-34.9, adult: Secondary | ICD-10-CM | POA: Diagnosis not present

## 2021-03-19 DIAGNOSIS — I10 Essential (primary) hypertension: Secondary | ICD-10-CM | POA: Diagnosis not present

## 2021-03-19 NOTE — Progress Notes (Signed)
Chief Complaint:   OBESITY Richard [name] is here to discuss his progress with his obesity treatment plan along with follow-up of his obesity related diagnoses. Richard [name] is on the Category 2 Plan and states he is following his eating plan approximately 60% of the time. Richard [name] states he is doing 0 minutes 0 times per week.  Today's visit was #: 15 Starting weight: 258 lbs Starting date: 04/08/2020 Today's weight: 226 lbs Today's date: 03/19/2021 Total lbs lost to date: 32 lbs Total lbs lost since last in-office visit: 7 lbs  Interim History: Richard [name] is down 7 lbs over the holidays. He did not go over board over the weekend.  Subjective:   1. Pre-diabetes Richard [name] is currently taking [place] (new medications) for cardiac.  2. Essential hypertension Richard [name] is taking Norvasc, HCTZ and Coreg. His blood pressure is controlled. His last blood pressure was 124/77.  Assessment/Plan:   1. Pre-diabetes Richard [name] will continue his medications. He will continue to work on weight loss, exercise, and decreasing simple carbohydrates to help decrease the risk of diabetes.   2. Essential hypertension Richard [name] will continue his medications. He is working on healthy weight loss and exercise to improve blood pressure control. We will watch for signs of hypotension as he continues his lifestyle modifications.  3. Obesity with current BMI of 34.4 Richard [name] is currently in the action stage of change. As such, his goal is to continue with weight loss efforts. He has agreed to the Category 2 Plan.   Richard [name] will continue meal planning and he will adhere closely to the plan.  Exercise goals: No exercise has been prescribed at this time.  Behavioral modification strategies: increasing lean protein intake, decreasing simple carbohydrates, increasing vegetables, increasing water intake, decreasing eating out, no skipping meals, meal planning and cooking strategies, keeping healthy foods in the home, and planning for success.  Richard [name]  has agreed to follow-up with our clinic in 3-4 weeks. He was informed of the importance of frequent follow-up visits to maximize his success with intensive lifestyle modifications for his multiple health conditions.   Objective:   Blood pressure 132/86, pulse 73, temperature (!) 97.4 F (36.3 C), height 5\' 8"  (1.727 m), weight 226 lb (102.5 kg), SpO2 99 %. Body mass index is 34.36 kg/m.  General: Cooperative, alert, well developed, in no acute distress. HEENT: Conjunctivae and lids unremarkable. Cardiovascular: Regular rhythm.  Lungs: Normal work of breathing. Neurologic: No focal deficits.   Lab Results  Component Value Date   CREATININE 0.92 12/09/2020   BUN 13 12/09/2020   NA 129 (L) 12/09/2020   K 3.7 12/09/2020   CL 89 (L) 12/09/2020   CO2 24 12/09/2020   Lab Results  Component Value Date   ALT 13 12/09/2020   AST 22 12/09/2020   ALKPHOS 124 (H) 12/09/2020   BILITOT 0.6 12/09/2020   Lab Results  Component Value Date   HGBA1C 6.0 (H) 12/09/2020   HGBA1C 6.3 (H) 07/24/2020   HGBA1C 6.2 (H) 04/08/2020   HGBA1C 6.1 (H) 04/30/2013   Lab Results  Component Value Date   INSULIN 12.3 07/24/2020   INSULIN 5.3 04/08/2020   Lab Results  Component Value Date   TSH 0.922 04/08/2020   Lab Results  Component Value Date   CHOL 157 12/09/2020   HDL 63 12/09/2020   LDLCALC 79 12/09/2020   TRIG 78 12/09/2020   CHOLHDL 2.8 07/26/2019   Lab Results  Component Value Date   VD25OH 72.2 12/09/2020   VD25OH 53.6  07/24/2020   VD25OH 60.0 04/08/2020   Lab Results  Component Value Date   WBC 5.3 05/18/2017   HGB 14.4 05/18/2017   HCT 43.2 05/18/2017   MCV 86 05/18/2017   PLT 248 05/18/2017   No results found for: IRON, TIBC, FERRITIN  Attestation Statements:   Reviewed by clinician on day of visit: allergies, medications, problem list, medical history, surgical history, family history, social history, and previous encounter notes.  I, Lizbeth Bark, RMA, am acting as  Location manager for [employer], DO.  I have reviewed the above documentation for accuracy and completeness, and I agree with the above. Jearld Lesch, DO

## 2021-03-23 ENCOUNTER — Encounter (INDEPENDENT_AMBULATORY_CARE_PROVIDER_SITE_OTHER): Payer: Self-pay | Admitting: Bariatrics

## 2021-03-23 NOTE — Progress Notes (Deleted)
Established Patient Office Visit  Subjective:  Patient ID: Richard [name], male    DOB: 06-19-1945  Age: 76 y.o. MRN: [id number]  CC: No chief complaint on file.   HPI Richard [name] presents for follow-up visit. He gained 3 lbs over the holidays.    Past Medical History:  Diagnosis Date   Acid reflux disease    Arthritis    Asthma    COPD (chronic obstructive pulmonary disease) (HCC)    Edema, lower extremity    Heart failure, diastolic (HCC)    High cholesterol    Hypertension    Myocardial infarction [facility]) 2004   Dr. Tamala Julian, at [place], yearly visits   OSA treated with BiPAP 04/24/2018   Mild OSA with an AHI of 13.5/hr and O2 sats of 88%. Now on BiPAP at 13/9cm H2O.    Sleep apnea    Vitamin D deficiency     Past Surgical History:  Procedure Laterality Date   CARDIAC CATHETERIZATION  2004   with 2 stents   CARDIOVASCULAR STRESS TEST  2011   COLONOSCOPY     POLYPECTOMY  02/17/2011   Procedure: POLYPECTOMY NASAL;  Surgeon: Delsa Bern;  Location: [place] OR;  Service: ENT;  Laterality: N/A;   SINUS ENDO W/FUSION  02/17/2011   Procedure: ENDOSCOPIC SINUS SURGERY WITH FUSION NAVIGATION;  Surgeon: Delsa Bern;  Location: [facility] OR;  Service: ENT;  Laterality: N/A;   TONSILLECTOMY     US ECHOCARDIOGRAPHY      Family History  Problem Relation Age of Onset   Heart attack Father    Diabetes Father    Hypertension Father    Cancer Mother    Alzheimer's disease Mother    Stroke Mother    Hypertension Sister    Diabetes Sister     Social History   Socioeconomic History   Marital status: Married    Spouse name: El Duende   Number of children: Not on file   Years of education: Not on file   Highest education level: Not on file  Occupational History   Not on file  Tobacco Use   Smoking status: Former   Smokeless tobacco: Never  Vaping Use   Vaping Use: Never used  Substance and Sexual Activity   Alcohol use: Yes    Alcohol/week: 6.0 standard drinks     Types: 2 Cans of beer, 4 Standard drinks or equivalent per week    Comment: 5-6 drinks/week   Drug use: No   Sexual activity: Not on file  Other Topics Concern   Not on file  Social History Narrative   Not on file   Social Determinants of Health   Financial Resource Strain: Not on file  Food Insecurity: Not on file  Transportation Needs: Not on file  Physical Activity: Not on file  Stress: Not on file  Social Connections: Not on file  Intimate Partner Violence: Not on file    Outpatient Medications Prior to Visit  Medication Sig Dispense Refill   amLODipine (NORVASC) 5 MG tablet Take 1 tablet (5 mg total) by mouth daily. 90 tablet 3   aspirin EC 81 MG tablet Take 1 tablet (81 mg total) by mouth daily. 90 tablet 3   atorvastatin (LIPITOR) 80 MG tablet Take 1 tablet (80 mg total) by mouth at bedtime. 90 tablet 4   carvedilol (COREG) 12.5 MG tablet Take 1 tablet (12.5 mg total) by mouth 2 (two) times daily. 180 tablet 0   chlorthalidone (HYGROTON) 50  MG tablet Take 1 tablet (50 mg total) by mouth daily. 90 tablet 4   COVID-19 mRNA Vac-TriS, Pfizer, (PFIZER-BIONT COVID-19 VAC-TRIS) SUSP injection Inject into the muscle. 0.3 mL 0   dapagliflozin propanediol (FARXIGA) 10 MG TABS tablet Take 1 tablet (10 mg total) by mouth daily before breakfast. 30 tablet 11   ergocalciferol (VITAMIN D2) 1.25 MG (50000 UT) capsule Take 1 capsule (50,000 Units total) by mouth once a week. 4 capsule 3   ezetimibe (ZETIA) 10 MG tablet Take 1 tablet (10 mg total) by mouth daily. 90 tablet 4   fluticasone furoate-vilanterol (BREO ELLIPTA) 200-25 MCG/ACT AEPB Inhale 1 puff into the lungs daily. 180 each 3   fluticasone furoate-vilanterol (BREO ELLIPTA) 200-25 MCG/INH AEPB Inhale 1 puff into the lungs daily. 180 each 5   guaiFENesin (MUCINEX) 600 MG 12 hr tablet Take by mouth 2 (two) times daily.     ipratropium-albuterol (DUONEB) 0.5-2.5 (3) MG/3ML SOLN USE 1 VIAL VIA NEBULIZER 2 TIMES DAILY & AT BEDTIME IF  NEEDED 540 mL 3   montelukast (SINGULAIR) 10 MG tablet Take 1 tablet (10 mg total) by mouth daily. 90 tablet 3   nitroGLYCERIN (NITROSTAT) 0.4 MG SL tablet Place 1 tablet (0.4 mg total) under the tongue every 5 (five) minutes as needed. For chest pain 25 tablet 5   potassium chloride (MICRO-K) 10 MEQ CR capsule Take 1 capsule (10 mEq total) by mouth daily. 30 capsule 4   tamsulosin (FLOMAX) 0.4 MG CAPS capsule Take 1 capsule (0.4 mg total) by mouth daily. 90 capsule 3   ipratropium-albuterol (DUONEB) 0.5-2.5 (3) MG/3ML SOLN INHALE 1 VIAL VIA NEBULIZER TWICE DAILY AND IF NEEDED AT BEDTIME. 360 mL 5   omeprazole (PRILOSEC) 40 MG capsule TAKE 1 CAPSULE BY MOUTH TWICE DAILY. 180 capsule 4   No facility-administered medications prior to visit.    Allergies  Allergen Reactions   Beef-Derived Products     *difficulty breathing*   Chicken Protein     *congestion*   Lisinopril Swelling    Angioedema    Peanut-Containing Drug Products     *congestion*    ROS Review of Systems    Objective:    Physical Exam  BP 132/86    Pulse 73    Temp (!) 97.4 F (36.3 C)    Ht '5\' 8"'  (1.727 m)    Wt 226 lb (102.5 kg)    SpO2 99%    BMI 34.36 kg/m  Wt Readings from Last 3 Encounters:  03/19/21 226 lb (102.5 kg)  03/14/21 233 lb (105.7 kg)  02/04/21 228 lb (103.4 kg)     Health Maintenance Due  Topic Date Due   Pneumonia Vaccine 23+ Years old (1 - PCV) Never done   Hepatitis C Screening  Never done   TETANUS/TDAP  Never done   Zoster Vaccines- Shingrix (1 of 2) Never done   INFLUENZA VACCINE  10/07/2020   COVID-19 Vaccine (5 - Booster for Pfizer series) 11/19/2020    There are no preventive care reminders to display for this patient.  Lab Results  Component Value Date   TSH 0.922 04/08/2020   Lab Results  Component Value Date   WBC 5.3 05/18/2017   HGB 14.4 05/18/2017   HCT 43.2 05/18/2017   MCV 86 05/18/2017   PLT 248 05/18/2017   Lab Results  Component Value Date   NA 129  (L) 12/09/2020   K 3.7 12/09/2020   CO2 24 12/09/2020   GLUCOSE 109 (H) 12/09/2020  BUN 13 12/09/2020   CREATININE 0.92 12/09/2020   BILITOT 0.6 12/09/2020   ALKPHOS 124 (H) 12/09/2020   AST 22 12/09/2020   ALT 13 12/09/2020   PROT 7.1 12/09/2020   ALBUMIN 4.7 12/09/2020   CALCIUM 9.5 12/09/2020   ANIONGAP 9 02/23/2015   EGFR 87 12/09/2020   Lab Results  Component Value Date   CHOL 157 12/09/2020   Lab Results  Component Value Date   HDL 63 12/09/2020   Lab Results  Component Value Date   LDLCALC 79 12/09/2020   Lab Results  Component Value Date   TRIG 78 12/09/2020   Lab Results  Component Value Date   CHOLHDL 2.8 07/26/2019   Lab Results  Component Value Date   HGBA1C 6.0 (H) 12/09/2020      Assessment & Plan:   Problem List Items Addressed This Visit       Other   Prediabetes - Primary   Class 2 severe obesity with serious comorbidity and body mass index (BMI) of 39.0 to 39.9 in adult [facility])   Other Visit Diagnoses     Essential hypertension           No orders of the defined types were placed in this encounter.   Follow-up: Return in about 4 weeks (around 04/16/2021).    Jearld Lesch, DO

## 2021-03-24 ENCOUNTER — Other Ambulatory Visit (HOSPITAL_COMMUNITY): Payer: Self-pay

## 2021-03-25 ENCOUNTER — Other Ambulatory Visit (HOSPITAL_COMMUNITY): Payer: Self-pay

## 2021-03-25 ENCOUNTER — Other Ambulatory Visit: Payer: PPO | Admitting: *Deleted

## 2021-03-25 ENCOUNTER — Other Ambulatory Visit: Payer: Self-pay

## 2021-03-25 DIAGNOSIS — I2510 Atherosclerotic heart disease of native coronary artery without angina pectoris: Secondary | ICD-10-CM

## 2021-03-25 DIAGNOSIS — I5032 Chronic diastolic (congestive) heart failure: Secondary | ICD-10-CM

## 2021-03-25 DIAGNOSIS — I10 Essential (primary) hypertension: Secondary | ICD-10-CM

## 2021-03-25 DIAGNOSIS — R0602 Shortness of breath: Secondary | ICD-10-CM

## 2021-03-25 DIAGNOSIS — G4733 Obstructive sleep apnea (adult) (pediatric): Secondary | ICD-10-CM

## 2021-03-25 DIAGNOSIS — E785 Hyperlipidemia, unspecified: Secondary | ICD-10-CM

## 2021-03-25 DIAGNOSIS — R413 Other amnesia: Secondary | ICD-10-CM

## 2021-03-25 DIAGNOSIS — I4510 Unspecified right bundle-branch block: Secondary | ICD-10-CM

## 2021-03-25 LAB — BASIC METABOLIC PANEL
BUN/Creatinine Ratio: 16 (ref 10–24)
BUN: 16 mg/dL (ref 8–27)
CO2: 26 mmol/L (ref 20–29)
Calcium: 9.4 mg/dL (ref 8.6–10.2)
Chloride: 89 mmol/L — ABNORMAL LOW (ref 96–106)
Creatinine, Ser: 1.03 mg/dL (ref 0.76–1.27)
Glucose: 103 mg/dL — ABNORMAL HIGH (ref 70–99)
Potassium: 3.5 mmol/L (ref 3.5–5.2)
Sodium: 129 mmol/L — ABNORMAL LOW (ref 134–144)
eGFR: 76 mL/min/{1.73_m2} (ref >59)

## 2021-03-27 ENCOUNTER — Ambulatory Visit: Payer: PPO | Admitting: Physician Assistant

## 2021-03-27 ENCOUNTER — Encounter: Payer: Self-pay | Admitting: Physician Assistant

## 2021-03-27 ENCOUNTER — Other Ambulatory Visit: Payer: Self-pay

## 2021-03-27 ENCOUNTER — Other Ambulatory Visit (INDEPENDENT_AMBULATORY_CARE_PROVIDER_SITE_OTHER): Payer: PPO

## 2021-03-27 VITALS — BP 134/80 | HR 74 | Resp 18 | Ht 69.0 in | Wt 229.0 lb

## 2021-03-27 DIAGNOSIS — G4730 Sleep apnea, unspecified: Secondary | ICD-10-CM

## 2021-03-27 DIAGNOSIS — R413 Other amnesia: Secondary | ICD-10-CM | POA: Diagnosis not present

## 2021-03-27 LAB — CBC
HCT: 40.4 % (ref 39.0–52.0)
Hemoglobin: 13.5 g/dL (ref 13.0–17.0)
MCHC: 33.5 g/dL (ref 30.0–36.0)
MCV: 85.9 fl (ref 78.0–100.0)
Platelets: 229 10*3/uL (ref 150.0–400.0)
RBC: 4.7 Mil/uL (ref 4.22–5.81)
RDW: 15.3 % (ref 11.5–15.5)
WBC: 5.3 10*3/uL (ref 4.0–10.5)

## 2021-03-27 LAB — TSH: TSH: 1.01 u[IU]/mL (ref 0.35–5.50)

## 2021-03-27 LAB — VITAMIN B12: Vitamin B-12: 206 pg/mL — ABNORMAL LOW (ref 211–911)

## 2021-03-27 NOTE — Progress Notes (Addendum)
Assessment/Plan:   Richard [name] is a very pleasant 76 y.o. year old RH male with risk factors including prediabetes, hypertension, obesity undergoing a program and weight loss, vitamin D deficiency, hyperlipidemia, history of sleep apnea not on CPAP, CHF, COPD, asthma, CAD status post MI in 2004, right bundle branch block, history of hyponatremia (latest sodium 129 due to diet) seen today for evaluation of memory loss. MoCA today 26/30 with mild difficulties in visuospatial executive, and delayed recall 4/5.    Recommendations:   Memory Loss, multifactorial  MRI brain with/without contrast to assess for underlying structural abnormality and assess vascular load  Neurocognitive testing to further evaluate cognitive concerns and determine underlying cause of memory changes, including potential contribution from sleep, anxiety, or depression  Check B12, TSH, CBC Pulmonary evaluation for sleep study Discussed safety both in and out of the home.  Discussed the importance of regular daily schedule with inclusion of crossword puzzles to maintain brain function.  Continue to monitor mood with PCP.  Stay active at least 30 minutes at least 3 times a week.  Naps should be scheduled and should be no longer than 60 minutes and should not occur after 2 PM.  Control cardiovascular risk factors  Mediterranean diet is recommended  Folllow up in 1 month and after neurocognitive testing   Subjective:    The patient is seen in neurologic consultation at the request of Vincente Liberty, MD for the evaluation of memory.  The patient is accompanied by his wife who supplements the history. This is a 76 y.o. year old RH  male who has had memory issues for about "a while" during the visit to their daughter in [place], she noticed that there was a mild decline in his father's cognition.  He was asking the same questions within minutes, and if confronted about it, he would become defensive.  He is less  detail oriented than before.  He denies repeating the same stories.  Sometimes, "he cannot find what he is looking for "when he gets to a room.  His mood may be changing, every day he does his wife "has not been a good day, has not started well "but when asked to elaborate, he "snaps "he continues to drive without any issues.  He enjoys doing crossword puzzles and word finding.  He sleeps well, except when he has to get up to urinate which is between 3 and 4 times a night.  His wife states that he tosses and turns and his hands move during the night, especially than in the last 8 months, but he denies having any vivid dreams.  He snores even with CPAP.  He denies any sleepwalking.  He denies any hallucinations or paranoia.  No hygiene concerns.  His medications are in a pillbox, he denies forgetting any doses.  His wife always did the finances.  His appetite is good, denies trouble swallowing.  Over the last 8 months, he has been in and controlled diet, but he discontinued all forms of sodium intake, which in turn turned to be provoking hyponatremia.  "He is all or nothing-his wife says ".  He cooks and denies leaving the stove on accidentally or forgetting common recipe.  He denies any head injuries, falls, seizure, or history of headaches.  He denies any vision changes, dizziness, focal numbness or tingling, unilateral weakness or tremors or anosmia.  No reported incontinence, retention, constipation or diarrhea.  He denies any alcohol, or tobacco.  Family history strong for  Alzheimer's disease in mother, and her 67 sisters.  He is retired from [employer], he retired 4 years ago      Allergies  Allergen Reactions   Beef-Derived Products     *difficulty breathing*   Chicken Protein     *congestion*   Lisinopril Swelling    Angioedema    Peanut-Containing Drug Products     *congestion*    Current Outpatient Medications  Medication Instructions   amLODipine (NORVASC) 5 mg, Oral,  Daily   aspirin EC 81 mg, Oral, Daily   atorvastatin (LIPITOR) 80 mg, Oral, Daily at bedtime   carvedilol (COREG) 12.5 mg, Oral, 2 times daily   chlorthalidone (HYGROTON) 50 mg, Oral, Daily   COVID-19 mRNA Vac-TriS, Pfizer, (PFIZER-BIONT COVID-19 VAC-TRIS) SUSP injection Intramuscular   dapagliflozin propanediol (FARXIGA) 10 MG TABS tablet 1 tablet in the morning   dapagliflozin propanediol (FARXIGA) 10 mg, Oral, Daily before breakfast   ezetimibe (ZETIA) 10 mg, Oral, Daily   fluticasone furoate-vilanterol (BREO ELLIPTA) 200-25 MCG/ACT AEPB 1 puff, Inhalation, Daily   fluticasone furoate-vilanterol (BREO ELLIPTA) 200-25 MCG/INH AEPB 1 puff, Inhalation, Daily   guaiFENesin (MUCINEX) 600 MG 12 hr tablet Oral, 2 times daily   ipratropium-albuterol (DUONEB) 0.5-2.5 (3) MG/3ML SOLN USE 1 VIAL VIA NEBULIZER 2 TIMES DAILY & AT BEDTIME IF NEEDED   ipratropium-albuterol (DUONEB) 0.5-2.5 (3) MG/3ML SOLN INHALE 1 VIAL VIA NEBULIZER TWICE DAILY AND IF NEEDED AT BEDTIME.   ipratropium-albuterol (DUONEB) 0.5-2.5 (3) MG/3ML SOLN Take 3 mLs by nebulization 2 times daily and if needed once at bedtime   montelukast (SINGULAIR) 10 mg, Oral, Daily   nitroGLYCERIN (NITROSTAT) 0.4 mg, Sublingual, Every 5 min PRN, For chest pain   omeprazole (PRILOSEC) 40 MG capsule TAKE 1 CAPSULE BY MOUTH TWICE DAILY.   polyethylene glycol powder (GLYCOLAX/MIRALAX) 17 GM/SCOOP powder See admin instructions   potassium chloride (MICRO-K) 10 MEQ CR capsule 10 mEq, Oral, Daily   tamsulosin (FLOMAX) 0.4 mg, Oral, Daily   Vitamin D (Ergocalciferol) (DRISDOL) 50,000 Units, Oral, Weekly     VITALS:   Vitals:   03/27/21 0954  BP: 134/80  Pulse: 74  Resp: 18  SpO2: 99%  Weight: 229 lb (103.9 kg)  Height: 5\' 9"  (1.753 m)   Depression screen [facility] 2/9 04/08/2020  Decreased Interest 1  Down, Depressed, Hopeless 0  PHQ - 2 Score 1  Altered sleeping 1  Tired, decreased energy 3  Change in appetite 0  Feeling bad or failure about  yourself  0  Trouble concentrating 0  Moving slowly or fidgety/restless 0  Suicidal thoughts 0  PHQ-9 Score 5    PHYSICAL EXAM   HEENT:  Normocephalic, atraumatic. The mucous membranes are moist. The superficial temporal arteries are without ropiness or tenderness. Cardiovascular: Regular rate and rhythm. Lungs: Clear to auscultation bilaterally. Neck: There are no carotid bruits noted bilaterally.  NEUROLOGICAL: Montreal Cognitive Assessment  03/27/2021  Visuospatial/ Executive (0/5) 2  Naming (0/3) 3  Attention: Read list of digits (0/2) 2  Attention: Read list of letters (0/1) 1  Attention: Serial 7 subtraction starting at 100 (0/3) 3  Language: Repeat phrase (0/2) 2  Language : Fluency (0/1) 1  Abstraction (0/2) 2  Delayed Recall (0/5) 4  Orientation (0/6) 6  Total 26   No flowsheet data found.  No flowsheet data found.   Orientation:  Alert and oriented to person, place and time. No aphasia or dysarthria. Fund of knowledge is appropriate. Recent memory impaired, and remote memory  intact.  Attention and concentration are normal.  Able to name objects and repeat phrases. Delayed recall 3/5 Cranial nerves: There is good facial symmetry. Extraocular muscles are intact and visual fields are full to confrontational testing. Speech is fluent and clear. Soft palate rises symmetrically and there is no tongue deviation. Hearing is intact to conversational tone. Tone: Tone is good throughout. Sensation: Sensation is intact to light touch and pinprick throughout. Vibration is intact at the bilateral big toe.There is no extinction with double simultaneous stimulation. There is no sensory dermatomal level identified. Coordination: The patient has no difficulty with RAM's or FNF bilaterally. Normal finger to nose  Motor: Strength is 5/5 in the bilateral upper and lower extremities. There is no pronator drift. There are no fasciculations noted. DTR's: Deep tendon reflexes are 2/4 at the  bilateral biceps, triceps, brachioradialis, patella and achilles.  Plantar responses are downgoing bilaterally. Gait and Station: The patient is able to ambulate without difficulty.The patient is able to heel toe walk without any difficulty.The patient is able to ambulate in a tandem fashion. The patient is able to stand in the Romberg position.     Thank you for allowing [place] the opportunity to participate in the care of this nice patient. Please do not hesitate to contact us for any questions or concerns.   Total time spent on today's visit was 60 minutes, including both face-to-face time and nonface-to-face time.  Time included that spent on review of records (prior notes available to me/labs/imaging if pertinent), discussing treatment and goals, answering patient's questions and coordinating care.  Cc:  Vincente Liberty, MD  Sharene Butters 03/27/2021 10:52 AM

## 2021-03-27 NOTE — Patient Instructions (Addendum)
It was a pleasure to see you today at our office.   Recommendations:  Neurocognitive evaluation at our office MRI of the brain, the radiology office will call you to arrange you appointment Check labs today TSH , B12 , CBC  Sleep study for the snoring /sleep apnea  Follow up  1 month and  after neurocognitive testing  RECOMMENDATIONS FOR ALL PATIENTS WITH MEMORY PROBLEMS: 1. Continue to exercise (Recommend 30 minutes of walking everyday, or 3 hours every week) 2. Increase social interactions - continue going to [place] and enjoy social gatherings with friends and family 3. Eat healthy, avoid fried foods and eat more fruits and vegetables 4. Maintain adequate blood pressure, blood sugar, and blood cholesterol level. Reducing the risk of stroke and cardiovascular disease also helps promoting better memory. 5. Avoid stressful situations. Live a simple life and avoid aggravations. Organize your time and prepare for the next day in anticipation. 6. Sleep well, avoid any interruptions of sleep and avoid any distractions in the bedroom that may interfere with adequate sleep quality 7. Avoid sugar, avoid sweets as there is a strong link between excessive sugar intake, diabetes, and cognitive impairment We discussed the Mediterranean diet, which has been shown to help patients reduce the risk of progressive memory disorders and reduces cardiovascular risk. This includes eating fish, eat fruits and green leafy vegetables, nuts like almonds and hazelnuts, walnuts, and also use olive oil. Avoid fast foods and fried foods as much as possible. Avoid sweets and sugar as sugar use has been linked to worsening of memory function.  There is always a concern of gradual progression of memory problems. If this is the case, then we may need to adjust level of care according to patient needs. Support, both to the patient and caregiver, should then be put into place.      You have been referred for a  neuropsychological evaluation (i.e., evaluation of memory and thinking abilities). Please bring someone with you to this appointment if possible, as it is helpful for the doctor to hear from both you and another adult who knows you well. Please bring eyeglasses and hearing aids if you wear them.    The evaluation will take approximately 3 hours and has two parts:   The first part is a clinical interview with the neuropsychologist (Dr. Melvyn Novas or Dr. Nicole Kindred). During the interview, the neuropsychologist will speak with you and the individual you brought to the appointment.    The second part of the evaluation is testing with the doctor's technician Hinton Dyer or Maudie Mercury). During the testing, the technician will ask you to remember different types of material, solve problems, and answer some questionnaires. Your family member will not be present for this portion of the evaluation.   Please note: We must reserve several hours of the neuropsychologist's time and the psychometrician's time for your evaluation appointment. As such, there is a No-Show fee of $100. If you are unable to attend any of your appointments, please contact our office as soon as possible to reschedule.    FALL PRECAUTIONS: Be cautious when walking. Scan the area for obstacles that may increase the risk of trips and falls. When getting up in the mornings, sit up at the edge of the bed for a few minutes before getting out of bed. Consider elevating the bed at the head end to avoid drop of blood pressure when getting up. Walk always in a well-lit room (use night lights in the walls). Avoid area rugs or  power cords from appliances in the middle of the walkways. Use a walker or a cane if necessary and consider physical therapy for balance exercise. Get your eyesight checked regularly.  FINANCIAL OVERSIGHT: Supervision, especially oversight when making financial decisions or transactions is also recommended.  HOME SAFETY: Consider the safety of the  kitchen when operating appliances like stoves, microwave oven, and blender. Consider having supervision and share cooking responsibilities until no longer able to participate in those. Accidents with firearms and other hazards in the house should be identified and addressed as well.   ABILITY TO BE LEFT ALONE: If patient is unable to contact 911 operator, consider using LifeLine, or when the need is there, arrange for someone to stay with patients. Smoking is a fire hazard, consider supervision or cessation. Risk of wandering should be assessed by caregiver and if detected at any point, supervision and safe proof recommendations should be instituted.  MEDICATION SUPERVISION: Inability to self-administer medication needs to be constantly addressed. Implement a mechanism to ensure safe administration of the medications.   DRIVING: Regarding driving, in patients with progressive memory problems, driving will be impaired. We advise to have someone else do the driving if trouble finding directions or if minor accidents are reported. Independent driving assessment is available to determine safety of driving.   If you are interested in the driving assessment, you can contact the following:  [employer] in [place]  [place] [phone] or [phone]    [place] refers to food and lifestyle choices that are based on the traditions of countries located on the [employer]. This way of eating has been shown to help prevent certain conditions and improve outcomes for people who have chronic diseases, like kidney disease and heart disease. What are tips for following this plan? Lifestyle  Cook and eat meals together with your family, when possible. Drink enough fluid to keep your urine clear or pale yellow. Be physically active every day. This  includes: Aerobic exercise like running or swimming. Leisure activities like gardening, walking, or housework. Get 7-8 hours of sleep each night. If recommended by your health care provider, drink red wine in moderation. This means 1 glass a day for nonpregnant women and 2 glasses a day for men. A glass of wine equals 5 oz (150 mL). Reading food labels  Check the serving size of packaged foods. For foods such as rice and pasta, the serving size refers to the amount of cooked product, not dry. Check the total fat in packaged foods. Avoid foods that have saturated fat or trans fats. Check the ingredients list for added sugars, such as corn syrup. Shopping  At the grocery store, buy most of your food from the areas near the walls of the store. This includes: Fresh fruits and vegetables (produce). Grains, beans, nuts, and seeds. Some of these may be available in unpackaged forms or large amounts (in bulk). Fresh seafood. Poultry and eggs. Low-fat dairy products. Buy whole ingredients instead of prepackaged foods. Buy fresh fruits and vegetables in-season from local farmers markets. Buy frozen fruits and vegetables in resealable bags. If you do not have access to quality fresh seafood, buy precooked frozen shrimp or canned fish, such as tuna, salmon, or sardines. Buy small amounts of raw or cooked vegetables, salads, or olives from the deli or salad bar at your store. Stock your pantry so you always have certain foods on  hand, such as olive oil, canned tuna, canned tomatoes, rice, pasta, and beans. Cooking  Cook foods with extra-virgin olive oil instead of using butter or other vegetable oils. Have meat as a side dish, and have vegetables or grains as your main dish. This means having meat in small portions or adding small amounts of meat to foods like pasta or stew. Use beans or vegetables instead of meat in common dishes like chili or lasagna. Experiment with different cooking methods. Try  roasting or broiling vegetables instead of steaming or sauteing them. Add frozen vegetables to soups, stews, pasta, or rice. Add nuts or seeds for added healthy fat at each meal. You can add these to yogurt, salads, or vegetable dishes. Marinate fish or vegetables using olive oil, lemon juice, garlic, and fresh herbs. Meal planning  Plan to eat 1 vegetarian meal one day each week. Try to work up to 2 vegetarian meals, if possible. Eat seafood 2 or more times a week. Have healthy snacks readily available, such as: Vegetable sticks with hummus. Greek yogurt. Fruit and nut trail mix. Eat balanced meals throughout the week. This includes: Fruit: 2-3 servings a day Vegetables: 4-5 servings a day Low-fat dairy: 2 servings a day Fish, poultry, or lean meat: 1 serving a day Beans and legumes: 2 or more servings a week Nuts and seeds: 1-2 servings a day Whole grains: 6-8 servings a day Extra-virgin olive oil: 3-4 servings a day Limit red meat and sweets to only a few servings a month What are my food choices? Mediterranean diet Recommended Grains: Whole-grain pasta. Brown rice. Bulgar wheat. Polenta. Couscous. Whole-wheat bread. [place]. Vegetables: Artichokes. Beets. Broccoli. Cabbage. Carrots. Eggplant. Green beans. Chard. Kale. Spinach. Onions. Leeks. Peas. Squash. Tomatoes. Peppers. Radishes. Fruits: Apples. Apricots. Avocado. Berries. Bananas. Cherries. Dates. Figs. Grapes. Lemons. Melon. Oranges. Peaches. Plums. Pomegranate. Meats and other protein foods: Beans. Almonds. Sunflower seeds. Pine nuts. Peanuts. [place]. Salmon. Scallops. Shrimp. [place]. Tilapia. Clams. Oysters. Eggs. Dairy: Low-fat milk. Cheese. Greek yogurt. Beverages: Water. Red wine. Herbal tea. Fats and oils: Extra virgin olive oil. Avocado oil. Grape seed oil. Sweets and desserts: [place] yogurt with honey. Baked apples. Poached pears. Trail mix. Seasoning and other foods: Basil. Cilantro. Coriander. Cumin. Mint.  Parsley. Sage. Rosemary. Tarragon. Garlic. Oregano. Thyme. Pepper. Balsalmic vinegar. Tahini. Hummus. Tomato sauce. Olives. Mushrooms. Limit these Grains: Prepackaged pasta or rice dishes. Prepackaged cereal with added sugar. Vegetables: Deep fried potatoes (french fries). Fruits: Fruit canned in syrup. Meats and other protein foods: Beef. Pork. Lamb. Poultry with skin. Hot dogs. [place] Salines. Dairy: Ice cream. Sour cream. Whole milk. Beverages: Juice. Sugar-sweetened soft drinks. Beer. Liquor and spirits. Fats and oils: Butter. Canola oil. Vegetable oil. Beef fat (tallow). Lard. Sweets and desserts: Cookies. Cakes. Pies. Candy. Seasoning and other foods: Mayonnaise. Premade sauces and marinades. The items listed may not be a complete list. Talk with your dietitian about what dietary choices are right for you. Summary The Mediterranean diet includes both food and lifestyle choices. Eat a variety of fresh fruits and vegetables, beans, nuts, seeds, and whole grains. Limit the amount of red meat and sweets that you eat. Talk with your health care provider about whether it is safe for you to drink red wine in moderation. This means 1 glass a day for nonpregnant women and 2 glasses a day for men. A glass of wine equals 5 oz (150 mL). This information is not intended to replace advice given to you by your health care provider. Make sure  you discuss any questions you have with your health care provider. Document Released: 10/17/2015 Document Revised: 11/19/2015 Document Reviewed: 10/17/2015 Elsevier Interactive Patient Education  2017 [employer].  We have sent a referral to [facility] for your MRI and they will call you directly to schedule your appointment. They are located at [address]. If you need to contact them directly please call [phone].  Your provider has requested that you have labwork completed today. Please go to [facility] ([id number]) on the second floor of  this building before leaving the office today. You do not need to check in. If you are not called within 15 minutes please check with the front desk.

## 2021-03-28 NOTE — Progress Notes (Signed)
Please inform patient that his B12 is low at 206, needs to take B12 supplements, 2000 mcg daily  for 1 month, then 1000 mcg a day thereafter, and follow up with the primary doctor, goal B12 levels for neurology is between 4000-1000 Thyroid is normal at 1.01 Thank you

## 2021-04-02 ENCOUNTER — Other Ambulatory Visit (HOSPITAL_COMMUNITY): Payer: Self-pay

## 2021-04-02 ENCOUNTER — Telehealth: Payer: Self-pay | Admitting: Interventional Cardiology

## 2021-04-02 DIAGNOSIS — I5032 Chronic diastolic (congestive) heart failure: Secondary | ICD-10-CM

## 2021-04-02 MED ORDER — CHLORTHALIDONE 25 MG PO TABS
25.0000 mg | ORAL_TABLET | Freq: Every day | ORAL | 3 refills | Status: DC
  Filled 2021-04-02: qty 90, 90d supply, fill #0

## 2021-04-02 NOTE — Telephone Encounter (Signed)
Patient states he is returning a call from [place] today and yesterday.

## 2021-04-02 NOTE — Telephone Encounter (Signed)
Spoke with pt. See result note.  °

## 2021-04-02 NOTE — Telephone Encounter (Signed)
Left message to call back  

## 2021-04-11 ENCOUNTER — Other Ambulatory Visit (HOSPITAL_COMMUNITY): Payer: Self-pay

## 2021-04-14 ENCOUNTER — Ambulatory Visit (INDEPENDENT_AMBULATORY_CARE_PROVIDER_SITE_OTHER): Payer: PPO | Admitting: Bariatrics

## 2021-04-14 ENCOUNTER — Other Ambulatory Visit: Payer: Self-pay

## 2021-04-14 VITALS — BP 135/79 | HR 72 | Temp 98.0°F | Ht 69.0 in | Wt 222.0 lb

## 2021-04-14 DIAGNOSIS — E669 Obesity, unspecified: Secondary | ICD-10-CM

## 2021-04-14 DIAGNOSIS — Z6832 Body mass index (BMI) 32.0-32.9, adult: Secondary | ICD-10-CM

## 2021-04-14 DIAGNOSIS — I10 Essential (primary) hypertension: Secondary | ICD-10-CM

## 2021-04-14 DIAGNOSIS — E559 Vitamin D deficiency, unspecified: Secondary | ICD-10-CM | POA: Diagnosis not present

## 2021-04-15 ENCOUNTER — Encounter (INDEPENDENT_AMBULATORY_CARE_PROVIDER_SITE_OTHER): Payer: Self-pay | Admitting: Bariatrics

## 2021-04-15 ENCOUNTER — Other Ambulatory Visit (HOSPITAL_COMMUNITY): Payer: Self-pay

## 2021-04-15 NOTE — Progress Notes (Signed)
Chief Complaint:   OBESITY Richard [name] is here to discuss his progress with his obesity treatment plan along with follow-up of his obesity related diagnoses. Richard [name] is on the Category 2 Plan and states he is following his eating plan approximately 60-65% of the time. Richard [name] states he is doing cardio and strength for 30 minutes 2 times per week.  Today's visit was #: 16 Starting weight: 258 lbs Starting date: 04/08/2020 Today's weight: 222 lbs Today's date: 04/14/2021 Total lbs lost to date: 36 lbs Total lbs lost since last in-office visit: 4 lbs  Interim History: Richard [name] is down another 4 lbs and has done well overall. He is doing well with his protein.  Subjective:   1. Primary hypertension [name] blood pressure is reasonably well controlled. His last blood pressure was 132/86.  2. Vitamin D deficiency [name] get minimal sun exposure.   Assessment/Plan:   1. Primary hypertension [name] will continue taking his medications. He is working on healthy weight loss and exercise to improve blood pressure control. We will watch for signs of hypotension as he continues his lifestyle modifications.  2. Vitamin D deficiency Low Vitamin D level contributes to fatigue and are associated with obesity, breast, and colon cancer. [name] will continue taking prescription Vitamin D 50,000 IU every week and he will follow-up for routine testing of Vitamin D, at least 2-3 times per year to avoid over-replacement.  3. Obesity with current BMI of 32.9 Richard [name] is currently in the action stage of change. As such, his goal is to continue with weight loss efforts. He has agreed to the Category 2 Plan.   Richard [name] will continue meal planning and he will be mindful eating. He will adhere closely to the plan 80-90%.   Exercise goals:  Richard [name] will go back to the gym for 30 minutes 2 times per week.  Behavioral modification strategies: increasing lean protein intake, decreasing simple carbohydrates, increasing vegetables,  increasing water intake, decreasing eating out, no skipping meals, meal planning and cooking strategies, keeping healthy foods in the home, and planning for success.  Richard [name] has agreed to follow-up with our clinic in 4 weeks. He was informed of the importance of frequent follow-up visits to maximize his success with intensive lifestyle modifications for his multiple health conditions.   Objective:   Blood pressure 135/79, pulse 72, temperature 98 F (36.7 C), height 5\' 9"  (1.753 m), weight 222 lb (100.7 kg), SpO2 100 %. Body mass index is 32.78 kg/m.  General: Cooperative, alert, well developed, in no acute distress. HEENT: Conjunctivae and lids unremarkable. Cardiovascular: Regular rhythm.  Lungs: Normal work of breathing. Neurologic: No focal deficits.   Lab Results  Component Value Date   CREATININE 1.03 03/25/2021   BUN 16 03/25/2021   NA 129 (L) 03/25/2021   K 3.5 03/25/2021   CL 89 (L) 03/25/2021   CO2 26 03/25/2021   Lab Results  Component Value Date   ALT 13 12/09/2020   AST 22 12/09/2020   ALKPHOS 124 (H) 12/09/2020   BILITOT 0.6 12/09/2020   Lab Results  Component Value Date   HGBA1C 6.0 (H) 12/09/2020   HGBA1C 6.3 (H) 07/24/2020   HGBA1C 6.2 (H) 04/08/2020   HGBA1C 6.1 (H) 04/30/2013   Lab Results  Component Value Date   INSULIN 12.3 07/24/2020   INSULIN 5.3 04/08/2020   Lab Results  Component Value Date   TSH 1.01 03/27/2021   Lab Results  Component Value Date   CHOL 157 12/09/2020  HDL 63 12/09/2020   LDLCALC 79 12/09/2020   TRIG 78 12/09/2020   CHOLHDL 2.8 07/26/2019   Lab Results  Component Value Date   VD25OH 72.2 12/09/2020   VD25OH 53.6 07/24/2020   VD25OH 60.0 04/08/2020   Lab Results  Component Value Date   WBC 5.3 03/27/2021   HGB 13.5 03/27/2021   HCT 40.4 03/27/2021   MCV 85.9 03/27/2021   PLT 229.0 03/27/2021   No results found for: IRON, TIBC, FERRITIN  Attestation Statements:   Reviewed by clinician on day of visit:  allergies, medications, problem list, medical history, surgical history, family history, social history, and previous encounter notes.  I, Lizbeth Bark, RMA, am acting as Location manager for [employer], DO.  I have reviewed the above documentation for accuracy and completeness, and I agree with the above. Jearld Lesch, DO

## 2021-04-24 ENCOUNTER — Other Ambulatory Visit (HOSPITAL_COMMUNITY): Payer: Self-pay

## 2021-04-24 MED ORDER — PEG 3350-KCL-NA BICARB-NACL 420 G PO SOLR
ORAL | 0 refills | Status: AC
  Filled 2021-04-24: qty 8000, 2d supply, fill #0

## 2021-04-26 ENCOUNTER — Ambulatory Visit
Admission: RE | Admit: 2021-04-26 | Discharge: 2021-04-26 | Disposition: A | Discharge status: Home / Self Care | Payer: PPO | Source: Ambulatory Visit | Attending: Physician Assistant | Admitting: Physician Assistant

## 2021-04-26 ENCOUNTER — Other Ambulatory Visit: Payer: Self-pay

## 2021-04-28 ENCOUNTER — Ambulatory Visit: Payer: PPO | Admitting: Physician Assistant

## 2021-04-28 ENCOUNTER — Encounter: Payer: Self-pay | Admitting: Physician Assistant

## 2021-04-28 ENCOUNTER — Other Ambulatory Visit: Payer: Self-pay

## 2021-04-28 VITALS — BP 149/88 | HR 74 | Resp 18 | Ht 69.0 in | Wt 225.0 lb

## 2021-04-28 DIAGNOSIS — R413 Other amnesia: Secondary | ICD-10-CM

## 2021-04-28 NOTE — Progress Notes (Signed)
Assessment/Plan:    Memory difficulties  Very pleasant 76 year old man, with extensive cardiac history, OSA on CPAP, low B12, and strong family history of Alzheimer's disease, presenting in follow-up for evaluation of memory difficulties.  His last MoCA during the first visit in January 2023 was 26/30.  MRI was unremarkable for acute findings, mild chronic small vessel disease was seen, as well as t normal size for age.  All risk factors may be contributing to his memory issue, but for clarity, he has been scheduled for a neurocognitive testing soon.  He is not on antidementia medications at this time.   Recommendations:  Discussed safety both in and out of the home.  Discussed the importance of regular daily schedule with inclusion of crossword puzzles to maintain brain function.  Continue to monitor mood by PCP Stay active at least 30 minutes at least 3 times a week.  Naps should be scheduled and should be no longer than 60 minutes and should not occur after 2 PM.  Mediterranean diet is recommended  Control cardiovascular risk factors  Recommend B12 supplement daily  Sleep study is pending Follow up in 6  months after neurotesting   Case discussed with Dr. Delice Lesch who agrees with the plan     Subjective:    KEHINDE [name] is a very pleasant 76 y.o. RH male with a history of prediabetes, hypertension, obesity undergoing a program and weight loss, vitamin D deficiency, hyperlipidemia, history of sleep apnea on CPAP (awaiting new studies), CHF, COPD, asthma, CAD status post MI in 2004, right bundle branch block, history of hyponatremia (latest sodium 129 due to diet) seen today for evaluation of memory loss.seen today in follow up for memory loss. This patient is accompanied in the office by his wife who supplements the history.  Previous records as well as any outside records available were reviewed prior to todays visit.  Patient was last seen at our office on  at which time his  MoCA is 26/30.  MRI of the brain was without a acute intracranial abnormality, mild chronic small vessel disease, normal size for age. Since his last visit, the patient reports that his memory is "about the same".  He may repeat himself, or asked the same questions within minutes.  He becomes irritable if confronted about it.  He continues to read extensively, doing crossword puzzles and word finding.  He sleeps well except when he has to get up to urinate (3 or 4 times at night).  His wife states that he continues to toss and turn and his hands move during the night, but he denies any vivid dreams.  No sleepwalking is reported.  He continues to snore even with CPAP.  He denies any hallucinations or paranoia.  No hygiene concerns.  Medications are in a pillbox, he denies missing any doses.  He is wife always did the finances. His appetite is good, denies trouble swallowing.  He denies any constipation.  He has a history of colon polyps, he is due for a colonoscopy this week.  He cooks and denies leaving the stove on accidentally or forgetting common recipe.  He denies any head injuries, falls, seizure, or history of headaches.  He denies any vision changes, dizziness, focal numbness or tingling, unilateral weakness or tremors or anosmia.  No reported incontinence, or retention.  He denies any alcohol, or tobacco.        Initial Visit 03/27/21 The patient is seen in neurologic consultation at the request of Katherine Roan,  Iona Beard, MD for the evaluation of memory.  The patient is accompanied by his wife who supplements the history. This is a 76 y.o. year old RH  male who has had memory issues for about "a while" during the visit to their daughter in [place], she noticed that there was a mild decline in his father's cognition.  He was asking the same questions within minutes, and if confronted about it, he would become defensive.  He is less detail oriented than before.  He denies repeating the same stories.  Sometimes,  "he cannot find what he is looking for "when he gets to a room.  His mood may be changing, every day he does his wife "has not been a good day, has not started well "but when asked to elaborate, he "snaps "he continues to drive without any issues.  He enjoys doing crossword puzzles and word finding.  He sleeps well, except when he has to get up to urinate which is between 3 and 4 times a night.  His wife states that he tosses and turns and his hands move during the night, especially than in the last 8 months, but he denies having any vivid dreams.  He snores even with CPAP.  He denies any sleepwalking.  He denies any hallucinations or paranoia.  No hygiene concerns.  His medications are in a pillbox, he denies forgetting any doses.  His wife always did the finances.  His appetite is good, denies trouble swallowing.  Over the last 8 months, he has been in and controlled diet, but he discontinued all forms of sodium intake, which in turn turned to be provoking hyponatremia.  "He is all or nothing-his wife says ".  He cooks and denies leaving the stove on accidentally or forgetting common recipe.  He denies any head injuries, falls, seizure, or history of headaches.  He denies any vision changes, dizziness, focal numbness or tingling, unilateral weakness or tremors or anosmia.  No reported incontinence, retention, constipation or diarrhea.  He denies any alcohol, or tobacco.  Family history strong for Alzheimer's disease in mother, and her 55 sisters.  He is retired from [employer], he retired 4 years ago    MRI brain 04/26/21 No acute intracranial abnormality. Mild for age signal changes.suggestive of chronic small vessel disease. 2. 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   Labs 03/27/21 B12 206,  TSH nl at 1.01     PREVIOUS MEDICATIONS:   CURRENT MEDICATIONS:  Outpatient Encounter Medications as of 04/28/2021   Medication Sig   amLODipine (NORVASC) 5 MG tablet Take 1 tablet (5 mg total) by mouth daily.   aspirin EC 81 MG tablet Take 1 tablet (81 mg total) by mouth daily.   atorvastatin (LIPITOR) 80 MG tablet Take 1 tablet (80 mg total) by mouth at bedtime.   carvedilol (COREG) 12.5 MG tablet Take 1 tablet (12.5 mg total) by mouth 2 (two) times daily.   chlorthalidone (HYGROTON) 25 MG tablet Take 1 tablet (25 mg total) by mouth daily.   COVID-19 mRNA Vac-TriS, Pfizer, (PFIZER-BIONT COVID-19 VAC-TRIS) SUSP injection Inject into the muscle.   dapagliflozin propanediol (FARXIGA) 10 MG TABS tablet Take 1 tablet (10 mg total) by mouth daily before breakfast.   dapagliflozin propanediol (FARXIGA) 10 MG TABS tablet 1 tablet in the morning   ergocalciferol (VITAMIN D2) 1.25 MG (50000 UT) capsule Take 1 capsule (50,000 Units total) by mouth once a week.  ezetimibe (ZETIA) 10 MG tablet Take 1 tablet (10 mg total) by mouth daily.   fluticasone furoate-vilanterol (BREO ELLIPTA) 200-25 MCG/ACT AEPB Inhale 1 puff into the lungs daily.   fluticasone furoate-vilanterol (BREO ELLIPTA) 200-25 MCG/INH AEPB Inhale 1 puff into the lungs daily.   guaiFENesin (MUCINEX) 600 MG 12 hr tablet Take by mouth 2 (two) times daily.   ipratropium-albuterol (DUONEB) 0.5-2.5 (3) MG/3ML SOLN Take 3 mLs by nebulization 2 times daily and if needed once at bedtime   montelukast (SINGULAIR) 10 MG tablet Take 1 tablet (10 mg total) by mouth daily.   nitroGLYCERIN (NITROSTAT) 0.4 MG SL tablet Place 1 tablet (0.4 mg total) under the tongue every 5 (five) minutes as needed. For chest pain   polyethylene glycol powder (GLYCOLAX/MIRALAX) 17 GM/SCOOP powder See admin instructions.   polyethylene glycol-electrolytes (NULYTELY) 420 g solution Use as directed   potassium chloride (MICRO-K) 10 MEQ CR capsule Take 1 capsule (10 mEq total) by mouth daily.   tamsulosin (FLOMAX) 0.4 MG CAPS capsule Take 1 capsule (0.4 mg total) by mouth daily.    ipratropium-albuterol (DUONEB) 0.5-2.5 (3) MG/3ML SOLN USE 1 VIAL VIA NEBULIZER 2 TIMES DAILY & AT BEDTIME IF NEEDED   omeprazole (PRILOSEC) 40 MG capsule TAKE 1 CAPSULE BY MOUTH TWICE DAILY.   No facility-administered encounter medications on file as of 04/28/2021.     Objective:     PHYSICAL EXAMINATION:    VITALS:   Vitals:   04/28/21 1426  BP: (!) 149/88  Pulse: 74  Resp: 18  SpO2: 98%  Weight: 225 lb (102.1 kg)  Height: 5\' 9"  (1.753 m)    GEN:  The patient appears stated age and is in NAD. HEENT:  Normocephalic, atraumatic.   Neurological examination:  General: NAD, well-groomed, appears stated age. Orientation: The patient is alert. Oriented to person, place and date Cranial nerves: There is good facial symmetry.The speech is fluent and clear. No aphasia or dysarthria. Fund of knowledge is appropriate. Recent memory mildly impaired, remote memory is normal. Attention and concentration are normal.  Able to name objects and repeat phrases.  Hearing is intact to conversational tone.    Sensation: Sensation is intact to light touch throughout Motor: Strength is at least antigravity x4. Tremors: none  DTR's 2/4 in [place] Cognitive Assessment  03/27/2021  Visuospatial/ Executive (0/5) 2  Naming (0/3) 3  Attention: Read list of digits (0/2) 2  Attention: Read list of letters (0/1) 1  Attention: Serial 7 subtraction starting at 100 (0/3) 3  Language: Repeat phrase (0/2) 2  Language : Fluency (0/1) 1  Abstraction (0/2) 2  Delayed Recall (0/5) 4  Orientation (0/6) 6  Total 26   No flowsheet data found.  No flowsheet data found.     Movement examination: Tone: There is normal tone in the UE/LE Abnormal movements:  no tremor.  No myoclonus.  No asterixis.   Coordination:  There is no decremation with RAM's. Normal finger to nose  Gait and Station: The patient has no difficulty arising out of a deep-seated chair without the use of the hands. The patient's  stride length is good.  Gait is cautious and narrow.     Total time spent on today's visit was 30 minutes, including both face-to-face time and nonface-to-face time. Time included that spent on review of records (prior notes available to me/labs/imaging if pertinent), discussing treatment and goals, answering patient's questions and coordinating care.  Cc:  Vincente Liberty, MD Clarise Cruz  Shawn Route, PA-C

## 2021-04-28 NOTE — Patient Instructions (Addendum)
It was a pleasure to see you today at our office.   Recommendations:  Follow up in  6 months after Neurotesting   RECOMMENDATIONS FOR ALL PATIENTS WITH MEMORY PROBLEMS: 1. Continue to exercise (Recommend 30 minutes of walking everyday, or 3 hours every week) 2. Increase social interactions - continue going to [place] and enjoy social gatherings with friends and family 3. Eat healthy, avoid fried foods and eat more fruits and vegetables 4. Maintain adequate blood pressure, blood sugar, and blood cholesterol level. Reducing the risk of stroke and cardiovascular disease also helps promoting better memory. 5. Avoid stressful situations. Live a simple life and avoid aggravations. Organize your time and prepare for the next day in anticipation. 6. Sleep well, avoid any interruptions of sleep and avoid any distractions in the bedroom that may interfere with adequate sleep quality 7. Avoid sugar, avoid sweets as there is a strong link between excessive sugar intake, diabetes, and cognitive impairment We discussed the Mediterranean diet, which has been shown to help patients reduce the risk of progressive memory disorders and reduces cardiovascular risk. This includes eating fish, eat fruits and green leafy vegetables, nuts like almonds and hazelnuts, walnuts, and also use olive oil. Avoid fast foods and fried foods as much as possible. Avoid sweets and sugar as sugar use has been linked to worsening of memory function.  There is always a concern of gradual progression of memory problems. If this is the case, then we may need to adjust level of care according to patient needs. Support, both to the patient and caregiver, should then be put into place.    FALL PRECAUTIONS: Be cautious when walking. Scan the area for obstacles that may increase the risk of trips and falls. When getting up in the mornings, sit up at the edge of the bed for a few minutes before getting out of bed. Consider elevating the bed at  the head end to avoid drop of blood pressure when getting up. Walk always in a well-lit room (use night lights in the walls). Avoid area rugs or power cords from appliances in the middle of the walkways. Use a walker or a cane if necessary and consider physical therapy for balance exercise. Get your eyesight checked regularly.  FINANCIAL OVERSIGHT: Supervision, especially oversight when making financial decisions or transactions is also recommended.  HOME SAFETY: Consider the safety of the kitchen when operating appliances like stoves, microwave oven, and blender. Consider having supervision and share cooking responsibilities until no longer able to participate in those. Accidents with firearms and other hazards in the house should be identified and addressed as well.   ABILITY TO BE LEFT ALONE: If patient is unable to contact 911 operator, consider using LifeLine, or when the need is there, arrange for someone to stay with patients. Smoking is a fire hazard, consider supervision or cessation. Risk of wandering should be assessed by caregiver and if detected at any point, supervision and safe proof recommendations should be instituted.  MEDICATION SUPERVISION: Inability to self-administer medication needs to be constantly addressed. Implement a mechanism to ensure safe administration of the medications.   DRIVING: Regarding driving, in patients with progressive memory problems, driving will be impaired. We advise to have someone else do the driving if trouble finding directions or if minor accidents are reported. Independent driving assessment is available to determine safety of driving.   If you are interested in the driving assessment, you can contact the following:  [employer] in [place]  Social research officer, government [facility] [phone]  [place] [phone] or [phone]

## 2021-05-06 ENCOUNTER — Other Ambulatory Visit (HOSPITAL_COMMUNITY): Payer: Self-pay

## 2021-05-06 ENCOUNTER — Encounter: Payer: Self-pay | Admitting: Cardiology

## 2021-05-06 ENCOUNTER — Ambulatory Visit: Payer: PPO | Admitting: Cardiology

## 2021-05-06 ENCOUNTER — Telehealth: Payer: Self-pay | Admitting: *Deleted

## 2021-05-06 ENCOUNTER — Other Ambulatory Visit: Payer: PPO | Admitting: *Deleted

## 2021-05-06 ENCOUNTER — Other Ambulatory Visit: Payer: Self-pay

## 2021-05-06 VITALS — BP 120/72 | HR 76 | Ht 69.0 in | Wt 222.8 lb

## 2021-05-06 DIAGNOSIS — I10 Essential (primary) hypertension: Secondary | ICD-10-CM

## 2021-05-06 DIAGNOSIS — G4733 Obstructive sleep apnea (adult) (pediatric): Secondary | ICD-10-CM

## 2021-05-06 DIAGNOSIS — I5032 Chronic diastolic (congestive) heart failure: Secondary | ICD-10-CM

## 2021-05-06 NOTE — Telephone Encounter (Signed)
-----   Message from Loren Racer, RN sent at 05/06/2021  3:18 PM EST ----- Needs CPAP supplies for 1 year

## 2021-05-06 NOTE — Progress Notes (Signed)
Date:  05/06/2021   ID:  Richard [name], DOB 1945/12/08, MRN [id number]  PCP:  Vincente Liberty, MD  Cardiologist:  Daneen Schick, MD Sleep Medicine:  Fransico Him, MD Electrophysiologist:  None   Chief Complaint:  OSA  History of Present Illness:    Richard [name] is a 76 y.o. male with a hx of Obesity, snoring and witnessed Apnea.  He was referred for sleep study which showed mild OSA with an AHI of 13.5/hr and O2 sats of 88% with events.  He underwent BiPAP titration to 13/9cm H2O.    He is doing well with his PAP device and thinks that he has gotten used to it.  He tolerates the mask and feels the pressure is adequate. He has never really felt and difference in how rested he feels in the am after starting PAP therapy but does not have to take any naps. He committed denies any significant mouth or nasal dryness or nasal congestion.  He does not think that he snores.     Prior CV studies:   The following studies were reviewed today:  PAP compliance download  Past Medical History:  Diagnosis Date   Acid reflux disease    Arthritis    Asthma    COPD (chronic obstructive pulmonary disease) (HCC)    Edema, lower extremity    Heart failure, diastolic (HCC)    High cholesterol    Hypertension    Myocardial infarction [facility]) 2004   Dr. Tamala Julian, at [place], yearly visits   OSA treated with BiPAP 04/24/2018   Mild OSA with an AHI of 13.5/hr and O2 sats of 88%. Now on BiPAP at 13/9cm H2O.    Sleep apnea    Vitamin D deficiency    Past Surgical History:  Procedure Laterality Date   CARDIAC CATHETERIZATION  2004   with 2 stents   CARDIOVASCULAR STRESS TEST  2011   COLONOSCOPY     POLYPECTOMY  02/17/2011   Procedure: POLYPECTOMY NASAL;  Surgeon: Delsa Bern;  Location: [facility];  Service: ENT;  Laterality: N/A;   SINUS ENDO W/FUSION  02/17/2011   Procedure: ENDOSCOPIC SINUS SURGERY WITH FUSION NAVIGATION;  Surgeon: Delsa Bern;  Location: [facility] OR;  Service: ENT;   Laterality: N/A;   TONSILLECTOMY     US ECHOCARDIOGRAPHY       Current Meds  Medication Sig   amLODipine (NORVASC) 5 MG tablet Take 1 tablet (5 mg total) by mouth daily.   aspirin EC 81 MG tablet Take 1 tablet (81 mg total) by mouth daily.   atorvastatin (LIPITOR) 80 MG tablet Take 1 tablet (80 mg total) by mouth at bedtime.   carvedilol (COREG) 12.5 MG tablet Take 1 tablet (12.5 mg total) by mouth 2 (two) times daily.   chlorthalidone (HYGROTON) 25 MG tablet Take 1 tablet (25 mg total) by mouth daily.   COVID-19 mRNA Vac-TriS, Pfizer, (PFIZER-BIONT COVID-19 VAC-TRIS) SUSP injection Inject into the muscle.   dapagliflozin propanediol (FARXIGA) 10 MG TABS tablet Take 1 tablet (10 mg total) by mouth daily before breakfast.   dapagliflozin propanediol (FARXIGA) 10 MG TABS tablet 1 tablet in the morning   ergocalciferol (VITAMIN D2) 1.25 MG (50000 UT) capsule Take 1 capsule (50,000 Units total) by mouth once a week.   ezetimibe (ZETIA) 10 MG tablet Take 1 tablet (10 mg total) by mouth daily.   fluticasone furoate-vilanterol (BREO ELLIPTA) 200-25 MCG/ACT AEPB Inhale 1 puff into the lungs daily.   fluticasone furoate-vilanterol (BREO ELLIPTA)  200-25 MCG/INH AEPB Inhale 1 puff into the lungs daily.   guaiFENesin (MUCINEX) 600 MG 12 hr tablet Take by mouth 2 (two) times daily.   ipratropium-albuterol (DUONEB) 0.5-2.5 (3) MG/3ML SOLN Take 3 mLs by nebulization 2 times daily and if needed once at bedtime   montelukast (SINGULAIR) 10 MG tablet Take 1 tablet (10 mg total) by mouth daily.   nitroGLYCERIN (NITROSTAT) 0.4 MG SL tablet Place 1 tablet (0.4 mg total) under the tongue every 5 (five) minutes as needed. For chest pain   polyethylene glycol powder (GLYCOLAX/MIRALAX) 17 GM/SCOOP powder See admin instructions.   polyethylene glycol-electrolytes (NULYTELY) 420 g solution Use as directed   potassium chloride (MICRO-K) 10 MEQ CR capsule Take 1 capsule (10 mEq total) by mouth daily.   tamsulosin  (FLOMAX) 0.4 MG CAPS capsule Take 1 capsule (0.4 mg total) by mouth daily.     Allergies:   Beef-derived products, Chicken protein, Lisinopril, and Peanut-containing drug products   Social History   Tobacco Use   Smoking status: Former   Smokeless tobacco: Never  Vaping Use   Vaping Use: Never used  Substance Use Topics   Alcohol use: Yes    Alcohol/week: 6.0 standard drinks    Types: 2 Cans of beer, 4 Standard drinks or equivalent per week    Comment: 5-6 drinks/week   Drug use: No     Family Hx: The patient's family history includes Alzheimer's disease in his mother; Cancer in his mother; Diabetes in his father and sister; Heart attack in his father; Hypertension in his father and sister; Stroke in his mother.  ROS:   Please see the history of present illness.     All other systems reviewed and are negative.   Labs/Other Tests and Data Reviewed:    Recent Labs: 12/09/2020: ALT 13 03/25/2021: BUN 16; Creatinine, Ser 1.03; Potassium 3.5; Sodium 129 03/27/2021: Hemoglobin 13.5; Platelets 229.0; TSH 1.01   Recent Lipid Panel Lab Results  Component Value Date/Time   CHOL 157 12/09/2020 10:46 AM   TRIG 78 12/09/2020 10:46 AM   HDL 63 12/09/2020 10:46 AM   CHOLHDL 2.8 07/26/2019 11:42 AM   LDLCALC 79 12/09/2020 10:46 AM    Wt Readings from Last 3 Encounters:  05/06/21 222 lb 12.8 oz (101.1 kg)  04/28/21 225 lb (102.1 kg)  04/14/21 222 lb (100.7 kg)     Objective:    Vital Signs:  BP 120/72    Pulse 76    Ht 5\' 9"  (1.753 m)    Wt 222 lb 12.8 oz (101.1 kg)    SpO2 99%    BMI 32.90 kg/m    GEN: Well nourished, well developed in no acute distress HEENT: Normal NECK: No JVD; No carotid bruits LYMPHATICS: No lymphadenopathy CARDIAC:RRR, no murmurs, rubs, gallops RESPIRATORY:  Clear to auscultation without rales, wheezing or rhonchi  ABDOMEN: Soft, non-tender, non-distended MUSCULOSKELETAL:  No edema; No deformity  SKIN: Warm and dry NEUROLOGIC:  Alert and oriented  x 3 PSYCHIATRIC:  Normal affect   ASSESSMENT & PLAN:    1.  OSA - The patient is tolerating PAP therapy well without any problems. The PAP download performed by his DME was personally reviewed and interpreted by me today and showed an AHI of 1.6 /hr on 13/9 cm H2O with 87% compliance in using more than 4 hours nightly.  The patient has been using and benefiting from PAP use and will continue to benefit from therapy.    2.  HTN -BP  is well controlled on exam today -Continue prescription drug management amlodipine 5 mg daily, carvedilol 12.5 mg twice daily and chlorthalidone 25 mg daily with as needed refills  Medication Adjustments/Labs and Tests Ordered: Current medicines are reviewed at length with the patient today.  Concerns regarding medicines are outlined above.  Tests Ordered: No orders of the defined types were placed in this encounter.  Medication Changes: No orders of the defined types were placed in this encounter.   Disposition:  Follow up in 1 year(s)  Signed, Fransico Him, MD  05/06/2021 2:58 PM    [place]

## 2021-05-06 NOTE — Patient Instructions (Signed)
Medication Instructions:  Your physician recommends that you continue on your current medications as directed. Please refer to the Current Medication list given to you today.  *If you need a refill on your cardiac medications before your next appointment, please call your pharmacy*   Lab Work: None If you have labs (blood work) drawn today and your tests are completely normal, you will receive your results only by: [place] (if you have MyChart) OR A paper copy in the mail If you have any lab test that is abnormal or we need to change your treatment, we will call you to review the results.   Testing/Procedures: None   Follow-Up: At [facility], you and your health needs are our priority.  As part of our continuing mission to provide you with exceptional heart care, we have created designated Provider Care Teams.  These Care Teams include your primary Cardiologist (physician) and Advanced Practice Providers (APPs -  Physician Assistants and Nurse Practitioners) who all work together to provide you with the care you need, when you need it.  We recommend signing up for the patient portal called "MyChart".  Sign up information is provided on this After Visit Summary.  MyChart is used to connect with patients for Virtual Visits (Telemedicine).  Patients are able to view lab/test results, encounter notes, upcoming appointments, etc.  Non-urgent messages can be sent to your provider as well.   To learn more about what you can do with MyChart, go to [email or web address].    Your next appointment:   1 year(s)  The format for your next appointment:   In Person  Provider:   Fransico Him, MD    Other Instructions

## 2021-05-06 NOTE — Telephone Encounter (Signed)
CPAP supplies for 1 year. [facility] to [facility].

## 2021-05-07 ENCOUNTER — Other Ambulatory Visit: Payer: Self-pay | Admitting: *Deleted

## 2021-05-07 ENCOUNTER — Telehealth: Payer: Self-pay | Admitting: Interventional Cardiology

## 2021-05-07 ENCOUNTER — Other Ambulatory Visit (HOSPITAL_COMMUNITY): Payer: Self-pay

## 2021-05-07 DIAGNOSIS — E876 Hypokalemia: Secondary | ICD-10-CM

## 2021-05-07 LAB — BASIC METABOLIC PANEL
BUN/Creatinine Ratio: 10 (ref 10–24)
BUN: 8 mg/dL (ref 8–27)
CO2: 26 mmol/L (ref 20–29)
Calcium: 9.2 mg/dL (ref 8.6–10.2)
Chloride: 88 mmol/L — ABNORMAL LOW (ref 96–106)
Creatinine, Ser: 0.84 mg/dL (ref 0.76–1.27)
Glucose: 98 mg/dL (ref 70–99)
Potassium: 3.3 mmol/L — ABNORMAL LOW (ref 3.5–5.2)
Sodium: 128 mmol/L — ABNORMAL LOW (ref 134–144)
eGFR: 91 mL/min/{1.73_m2} (ref >59)

## 2021-05-07 MED ORDER — CHLORTHALIDONE 25 MG PO TABS: 12.5000 mg | ORAL_TABLET | Freq: Every day | ORAL | 3 refills | Status: DC

## 2021-05-07 NOTE — Telephone Encounter (Signed)
Patient is returning call to discuss lab results. Please return call to patient at [phone]. ?

## 2021-05-07 NOTE — Telephone Encounter (Signed)
Pt aware of lab results ./cy 

## 2021-05-07 NOTE — Telephone Encounter (Signed)
Let the patient know decrease chlorthalidone to 12.5 mg/day.  Basic metabolic panel in 1 month. ?A copy will be sent to Vincente Liberty, MD ?

## 2021-05-12 ENCOUNTER — Other Ambulatory Visit (HOSPITAL_COMMUNITY): Payer: Self-pay

## 2021-05-12 ENCOUNTER — Ambulatory Visit (INDEPENDENT_AMBULATORY_CARE_PROVIDER_SITE_OTHER): Payer: PPO | Admitting: Physician Assistant

## 2021-05-12 ENCOUNTER — Encounter (INDEPENDENT_AMBULATORY_CARE_PROVIDER_SITE_OTHER): Payer: Self-pay | Admitting: Physician Assistant

## 2021-05-12 ENCOUNTER — Other Ambulatory Visit: Payer: Self-pay

## 2021-05-12 VITALS — BP 145/21 | HR 71 | Temp 98.2°F | Ht 69.0 in | Wt 217.0 lb

## 2021-05-12 DIAGNOSIS — E669 Obesity, unspecified: Secondary | ICD-10-CM

## 2021-05-12 DIAGNOSIS — I10 Essential (primary) hypertension: Secondary | ICD-10-CM

## 2021-05-12 DIAGNOSIS — Z6832 Body mass index (BMI) 32.0-32.9, adult: Secondary | ICD-10-CM | POA: Diagnosis not present

## 2021-05-12 DIAGNOSIS — Z6839 Body mass index (BMI) 39.0-39.9, adult: Secondary | ICD-10-CM

## 2021-05-13 ENCOUNTER — Other Ambulatory Visit (HOSPITAL_COMMUNITY): Payer: Self-pay

## 2021-05-13 ENCOUNTER — Institutional Professional Consult (permissible substitution): Payer: PPO | Admitting: Pulmonary Disease

## 2021-05-14 NOTE — Progress Notes (Signed)
? ? ? ?Chief Complaint:  ? ?OBESITY ?Richard [name] is here to discuss his progress with his obesity treatment plan along with follow-up of his obesity related diagnoses. Richard [name] is on the Category 2 Plan and states he is following his eating plan approximately 75% of the time. Richard [name] states he is doing light cardio for 30 2 times per week and golfing for 30 minutes 1 times per week. ? ?Today's visit was #: 57 ?Starting weight: 258 lbs ?Starting date: 04/08/2020 ?Today's weight: 217 lbs ?Today's date: 05/12/2021 ?Total lbs lost to date: 41 lbs ?Total lbs lost since last in-office visit: 5 lbs ? ?Interim History: Richard [name] states that he has no issues with the plan. If he has a choice he will under eat rather  than over eat his protein. He is traveling to the beach in 2 weeks for a guy's trip. He is thinking about going to the gym to workout.  ? ?Subjective:  ? ?1. Primary hypertension ?Richard [name]'s blood pressure is managed by his cardiology who recently decreased chlorthalidone to 12.5 mg daily and will follow up with him in a month. His blood pressure is slightly elevated today at ?145/21. He denies chest pains or headaches.  ? ?Assessment/Plan:  ? ?1. Primary hypertension ?Richard [name] will follow up with cardiology. He will continue with meal plan and exercise. He is working on healthy weight loss and exercise to improve blood pressure control. We will watch for signs of hypotension as he continues his lifestyle modifications. ? ?2. Obesity with current BMI of 32.03 ?Richard [name] is currently in the action stage of change. As such, his goal is to continue with weight loss efforts. He has agreed to the Category 2 Plan.  ? ?Exercise goals:  Richard [name] will add 2 days of strength training.  ? ?Behavioral modification strategies: increasing lean protein intake, meal planning and cooking strategies, and travel eating strategies. ? ?Richard [name] has agreed to follow-up with our clinic in 4 weeks. He was informed of the importance of frequent follow-up visits to  maximize his success with intensive lifestyle modifications for his multiple health conditions.  ? ?Objective:  ? ?Blood pressure (!) 145/21, pulse 71, temperature 98.2 ?F (36.8 ?C), height '5\' 9"'$  (1.753 m), weight 217 lb (98.4 kg), SpO2 98 %. ?Body mass index is 32.05 kg/m?. ? ?General: Cooperative, alert, well developed, in no acute distress. ?HEENT: Conjunctivae and lids unremarkable. ?Cardiovascular: Regular rhythm.  ?Lungs: Normal work of breathing. ?Neurologic: No focal deficits.  ? ?Lab Results  ?Component Value Date  ? CREATININE 0.84 05/06/2021  ? BUN 8 05/06/2021  ? NA 128 (L) 05/06/2021  ? K 3.3 (L) 05/06/2021  ? CL 88 (L) 05/06/2021  ? CO2 26 05/06/2021  ? ?Lab Results  ?Component Value Date  ? ALT 13 12/09/2020  ? AST 22 12/09/2020  ? ALKPHOS 124 (H) 12/09/2020  ? BILITOT 0.6 12/09/2020  ? ?Lab Results  ?Component Value Date  ? HGBA1C 6.0 (H) 12/09/2020  ? HGBA1C 6.3 (H) 07/24/2020  ? HGBA1C 6.2 (H) 04/08/2020  ? HGBA1C 6.1 (H) 04/30/2013  ? ?Lab Results  ?Component Value Date  ? INSULIN 12.3 07/24/2020  ? INSULIN 5.3 04/08/2020  ? ?Lab Results  ?Component Value Date  ? TSH 1.01 03/27/2021  ? ?Lab Results  ?Component Value Date  ? CHOL 157 12/09/2020  ? HDL 63 12/09/2020  ? [place] 79 12/09/2020  ? TRIG 78 12/09/2020  ? CHOLHDL 2.8 07/26/2019  ? ?Lab Results  ?Component Value Date  ? VD25OH 72.2 12/09/2020  ?  VD25OH 53.6 07/24/2020  ? VD25OH 60.0 04/08/2020  ? ?Lab Results  ?Component Value Date  ? WBC 5.3 03/27/2021  ? HGB 13.5 03/27/2021  ? HCT 40.4 03/27/2021  ? MCV 85.9 03/27/2021  ? PLT 229.0 03/27/2021  ? ?No results found for: IRON, TIBC, FERRITIN ? ?Obesity Behavioral Intervention:  ? ?Approximately 15 minutes were spent on the discussion below. ? ?ASK: ?We discussed the diagnosis of obesity with Richard [name] today and Richard [name] agreed to give [place] permission to discuss obesity behavioral modification therapy today. ? ?ASSESS: ?Richard [name] has the diagnosis of obesity and his BMI today is 32.0. Richard [name] is in the  action stage of change.  ? ?ADVISE: ?Richard [name] was educated on the multiple health risks of obesity as well as the benefit of weight loss to improve his health. He was advised of the need for long term treatment and the importance of lifestyle modifications to improve his current health and to decrease his risk of future health problems. ? ?AGREE: ?Multiple dietary modification options and treatment options were discussed and Richard [name] agreed to follow the recommendations documented in the above note. ? ?ARRANGE: ?Richard [name] was educated on the importance of frequent visits to treat obesity as outlined per CMS and USPSTF guidelines and agreed to schedule his next follow up appointment today. ? ?Attestation Statements:  ? ?Reviewed by clinician on day of visit: allergies, medications, problem list, medical history, surgical history, family history, social history, and previous encounter notes. ? ?I, Tonye Pearson, am acting as Location manager for [employer], PA-C.. ? ?I have reviewed the above documentation for accuracy and completeness, and I agree with the above. Abby Potash, PA-C ? ?

## 2021-05-19 ENCOUNTER — Other Ambulatory Visit (HOSPITAL_COMMUNITY): Payer: Self-pay

## 2021-06-09 ENCOUNTER — Other Ambulatory Visit (HOSPITAL_COMMUNITY): Payer: Self-pay

## 2021-06-09 ENCOUNTER — Encounter (INDEPENDENT_AMBULATORY_CARE_PROVIDER_SITE_OTHER): Payer: Self-pay | Admitting: Bariatrics

## 2021-06-09 ENCOUNTER — Ambulatory Visit (INDEPENDENT_AMBULATORY_CARE_PROVIDER_SITE_OTHER): Payer: PPO | Admitting: Bariatrics

## 2021-06-09 VITALS — BP 148/84 | HR 71 | Temp 98.1°F | Ht 69.0 in | Wt 217.0 lb

## 2021-06-09 DIAGNOSIS — Z6832 Body mass index (BMI) 32.0-32.9, adult: Secondary | ICD-10-CM | POA: Diagnosis not present

## 2021-06-09 DIAGNOSIS — R7303 Prediabetes: Secondary | ICD-10-CM

## 2021-06-09 DIAGNOSIS — E669 Obesity, unspecified: Secondary | ICD-10-CM | POA: Diagnosis not present

## 2021-06-09 DIAGNOSIS — E7849 Other hyperlipidemia: Secondary | ICD-10-CM

## 2021-06-09 DIAGNOSIS — Z6839 Body mass index (BMI) 39.0-39.9, adult: Secondary | ICD-10-CM

## 2021-06-10 ENCOUNTER — Other Ambulatory Visit: Payer: PPO | Admitting: *Deleted

## 2021-06-10 DIAGNOSIS — E876 Hypokalemia: Secondary | ICD-10-CM

## 2021-06-10 LAB — BASIC METABOLIC PANEL
BUN/Creatinine Ratio: 14 (ref 10–24)
BUN: 12 mg/dL (ref 8–27)
CO2: 25 mmol/L (ref 20–29)
Calcium: 9.2 mg/dL (ref 8.6–10.2)
Chloride: 91 mmol/L — ABNORMAL LOW (ref 96–106)
Creatinine, Ser: 0.86 mg/dL (ref 0.76–1.27)
Glucose: 82 mg/dL (ref 70–99)
Potassium: 4.2 mmol/L (ref 3.5–5.2)
Sodium: 130 mmol/L — ABNORMAL LOW (ref 134–144)
eGFR: 90 mL/min/{1.73_m2} (ref >59)

## 2021-06-10 NOTE — Progress Notes (Signed)
? ? ? ?Chief Complaint:  ? ?OBESITY ?Richard [name] is here to discuss his progress with his obesity treatment plan along with follow-up of his obesity related diagnoses. Richard [name] is on the Category 2 Plan and states he is following his eating plan approximately 80% of the time. Richard [name] states he is golfing for 3-4 hours 2 times per week. ? ?Today's visit was #: 18 ?Starting weight: 258 lbs ?Starting date: 04/08/2020 ?Today's weight: 217 lbs ?Today's date: 06/09/2021 ?Total lbs lost to date: 41 lbs ?Total lbs lost since last in-office visit: 0 ? ?Interim History: Richard [name] weight remains the same as his previous visit. He has been on a golfing trip. He is doing well with water and protein.  ? ?Subjective:  ? ?1. Other hyperlipidemia ?Richard [name] is currently taking Lipitor.  ? ?2. Pre-diabetes ?Richard [name] is taking [place] currently.  ? ?Assessment/Plan:  ? ?1. Other hyperlipidemia ?Cardiovascular risk and specific lipid/LDL goals reviewed.  Richard [name] will continue taking Lipitor. We discussed several lifestyle modifications today and Richard [name] will continue to work on diet, exercise and weight loss efforts. Orders and follow up as documented in patient record.  ? ?Counseling ?Intensive lifestyle modifications are the first line treatment for this issue. ?Dietary changes: Increase soluble fiber. Decrease simple carbohydrates. ?Exercise changes: Moderate to vigorous-intensity aerobic activity 150 minutes per week if tolerated. ?Lipid-lowering medications: see documented in medical record. ? ?2. Pre-diabetes ?Richard [name] will minimize all carbohydrates (sweets and starches). He will continue to work on weight loss, exercise, and decreasing simple carbohydrates to help decrease the risk of diabetes.  ? ?3. Obesity with current BMI of 32.1 ?Richard [name] is currently in the action stage of change. As such, his goal is to continue with weight loss efforts. He has agreed to the Category 2 Plan.  ? ?Richard [name] will adhere closely to the plan 80-90%. He will be mindful eating.   ? ?Exercise goals:  As is. Richard [name] is going to [employer]. He is doing some weights at home.  ? ?Behavioral modification strategies: increasing lean protein intake, decreasing simple carbohydrates, increasing vegetables, increasing water intake, decreasing eating out, no skipping meals, meal planning and cooking strategies, keeping healthy foods in the home, and planning for success. ? ?Richard [name] has agreed to follow-up with our clinic in 4-5 weeks. He was informed of the importance of frequent follow-up visits to maximize his success with intensive lifestyle modifications for his multiple health conditions.  ? ?Objective:  ? ?Blood pressure (!) 148/84, pulse 71, temperature 98.1 ?F (36.7 ?C), height '5\' 9"'$  (1.753 m), weight 217 lb (98.4 kg), SpO2 100 %. ?Body mass index is 32.05 kg/m?. ? ?General: Cooperative, alert, well developed, in no acute distress. ?HEENT: Conjunctivae and lids unremarkable. ?Cardiovascular: Regular rhythm.  ?Lungs: Normal work of breathing. ?Neurologic: No focal deficits.  ? ?Lab Results  ?Component Value Date  ? CREATININE 0.84 05/06/2021  ? BUN 8 05/06/2021  ? NA 128 (L) 05/06/2021  ? K 3.3 (L) 05/06/2021  ? CL 88 (L) 05/06/2021  ? CO2 26 05/06/2021  ? ?Lab Results  ?Component Value Date  ? ALT 13 12/09/2020  ? AST 22 12/09/2020  ? ALKPHOS 124 (H) 12/09/2020  ? BILITOT 0.6 12/09/2020  ? ?Lab Results  ?Component Value Date  ? HGBA1C 6.0 (H) 12/09/2020  ? HGBA1C 6.3 (H) 07/24/2020  ? HGBA1C 6.2 (H) 04/08/2020  ? HGBA1C 6.1 (H) 04/30/2013  ? ?Lab Results  ?Component Value Date  ? INSULIN 12.3 07/24/2020  ? INSULIN 5.3 04/08/2020  ? ?Lab Results  ?  Component Value Date  ? TSH 1.01 03/27/2021  ? ?Lab Results  ?Component Value Date  ? CHOL 157 12/09/2020  ? HDL 63 12/09/2020  ? [place] 79 12/09/2020  ? TRIG 78 12/09/2020  ? CHOLHDL 2.8 07/26/2019  ? ?Lab Results  ?Component Value Date  ? VD25OH 72.2 12/09/2020  ? VD25OH 53.6 07/24/2020  ? VD25OH 60.0 04/08/2020  ? ?Lab Results  ?Component Value Date  ?  WBC 5.3 03/27/2021  ? HGB 13.5 03/27/2021  ? HCT 40.4 03/27/2021  ? MCV 85.9 03/27/2021  ? PLT 229.0 03/27/2021  ? ?No results found for: IRON, TIBC, FERRITIN ? ?Attestation Statements:  ? ?Reviewed by clinician on day of visit: allergies, medications, problem list, medical history, surgical history, family history, social history, and previous encounter notes. ? ?I, Lizbeth Bark, RMA, am acting as transcriptionist for [employer], DO. ? ?I have reviewed the above documentation for accuracy and completeness, and I agree with the above. Jearld Lesch, DO ? ?

## 2021-06-11 ENCOUNTER — Encounter (INDEPENDENT_AMBULATORY_CARE_PROVIDER_SITE_OTHER): Payer: Self-pay | Admitting: Bariatrics

## 2021-06-17 ENCOUNTER — Other Ambulatory Visit (HOSPITAL_COMMUNITY): Payer: Self-pay

## 2021-06-30 ENCOUNTER — Other Ambulatory Visit (HOSPITAL_COMMUNITY): Payer: Self-pay

## 2021-07-07 ENCOUNTER — Encounter (INDEPENDENT_AMBULATORY_CARE_PROVIDER_SITE_OTHER): Payer: Self-pay | Admitting: Bariatrics

## 2021-07-07 ENCOUNTER — Ambulatory Visit (INDEPENDENT_AMBULATORY_CARE_PROVIDER_SITE_OTHER): Payer: PPO | Admitting: Bariatrics

## 2021-07-07 VITALS — BP 148/78 | HR 72 | Temp 97.8°F | Ht 69.0 in | Wt 217.0 lb

## 2021-07-07 DIAGNOSIS — R7303 Prediabetes: Secondary | ICD-10-CM

## 2021-07-07 DIAGNOSIS — E669 Obesity, unspecified: Secondary | ICD-10-CM

## 2021-07-07 DIAGNOSIS — Z6832 Body mass index (BMI) 32.0-32.9, adult: Secondary | ICD-10-CM | POA: Diagnosis not present

## 2021-07-07 DIAGNOSIS — E66812 Obesity, class 2: Secondary | ICD-10-CM

## 2021-07-07 DIAGNOSIS — E785 Hyperlipidemia, unspecified: Secondary | ICD-10-CM | POA: Diagnosis not present

## 2021-07-08 ENCOUNTER — Other Ambulatory Visit: Payer: Self-pay | Admitting: Interventional Cardiology

## 2021-07-08 ENCOUNTER — Other Ambulatory Visit (HOSPITAL_COMMUNITY): Payer: Self-pay

## 2021-07-08 MED ORDER — CARVEDILOL 12.5 MG PO TABS
12.5000 mg | ORAL_TABLET | Freq: Two times a day (BID) | ORAL | 0 refills | Status: DC
  Filled 2021-07-08: qty 180, 90d supply, fill #0

## 2021-07-14 ENCOUNTER — Other Ambulatory Visit (HOSPITAL_COMMUNITY): Payer: Self-pay

## 2021-07-14 NOTE — Progress Notes (Signed)
? ? ? ?Chief Complaint:  ? ?OBESITY ?Richard [name] is here to discuss his progress with his obesity treatment plan along with follow-up of his obesity related diagnoses. Avant is on the Category 2 Plan and states he is following his eating plan approximately 80% of the time. Mikel states he is doing cardio, treadmill, rowing, and golfing for 60-90 minutes 3 times per week. ? ?Today's visit was #: 19 ?Starting weight: 258 lbs ?Starting date: 04/08/2020 ?Today's weight: 217 lbs ?Today's date: 07/07/2021 ?Total lbs lost to date: 41 lbs ?Total lbs lost since last in-office visit: 0 ? ?Interim History: Caydon weight remains the same as his last visit but he has done well overall. He is doing well with water and protein.  ? ?Subjective:  ? ?1. Prediabetes ?Joahan is currently taking [place] for cardiac issues.  ? ?2. Hyperlipidemia, unspecified hyperlipidemia type ?Joshuah is currently taking Lipitor.  ? ?Assessment/Plan:  ? ?1. Prediabetes ?Versie will continue [place]. He will continue to work on weight loss, exercise, and decreasing simple carbohydrates to help decrease the risk of diabetes.  ? ?2. Hyperlipidemia, unspecified hyperlipidemia type ?Cardiovascular risk and specific lipid/LDL goals reviewed.  Lonnel will continue Lipitor. We discussed several lifestyle modifications today and Lemond will continue to work on diet, exercise and weight loss efforts. Orders and follow up as documented in patient record.  ? ?Counseling ?Intensive lifestyle modifications are the first line treatment for this issue. ?Dietary changes: Increase soluble fiber. Decrease simple carbohydrates. ?Exercise changes: Moderate to vigorous-intensity aerobic activity 150 minutes per week if tolerated. ?Lipid-lowering medications: see documented in medical record. ? ?3. Obesity with current BMI of 32.1 ?Shalev is currently in the action stage of change. As such, his goal is to continue with weight loss efforts. He has agreed to the Category 2 Plan and keeping a  food journal and adhering to recommended goals of 1200 calories and 80 grams of protein.  ? ?Delmar will stay adherent to the plan 85-95%. He will be mindful eating. He was provided [employer] today.  ? ?Exercise goals:  As is.  ? ?Behavioral modification strategies: increasing lean protein intake, decreasing simple carbohydrates, increasing vegetables, increasing water intake, decreasing eating out, no skipping meals, meal planning and cooking strategies, keeping healthy foods in the home, and planning for success. ? ?Dartagnan has agreed to follow-up with our clinic in 4 weeks. He was informed of the importance of frequent follow-up visits to maximize his success with intensive lifestyle modifications for his multiple health conditions.  ? ?Objective:  ? ?Blood pressure (!) 148/78, pulse 72, temperature 97.8 ?F (36.6 ?C), height '5\' 9"'$  (1.753 m), weight 217 lb (98.4 kg), SpO2 98 %. ?Body mass index is 32.05 kg/m?. ? ?General: Cooperative, alert, well developed, in no acute distress. ?HEENT: Conjunctivae and lids unremarkable. ?Cardiovascular: Regular rhythm.  ?Lungs: Normal work of breathing. ?Neurologic: No focal deficits.  ? ?Lab Results  ?Component Value Date  ? CREATININE 0.86 06/10/2021  ? BUN 12 06/10/2021  ? NA 130 (L) 06/10/2021  ? K 4.2 06/10/2021  ? CL 91 (L) 06/10/2021  ? CO2 25 06/10/2021  ? ?Lab Results  ?Component Value Date  ? ALT 13 12/09/2020  ? AST 22 12/09/2020  ? ALKPHOS 124 (H) 12/09/2020  ? BILITOT 0.6 12/09/2020  ? ?Lab Results  ?Component Value Date  ? HGBA1C 6.0 (H) 12/09/2020  ? HGBA1C 6.3 (H) 07/24/2020  ? HGBA1C 6.2 (H) 04/08/2020  ? HGBA1C 6.1 (H) 04/30/2013  ? ?Lab Results  ?Component  Value Date  ? INSULIN 12.3 07/24/2020  ? INSULIN 5.3 04/08/2020  ? ?Lab Results  ?Component Value Date  ? TSH 1.01 03/27/2021  ? ?Lab Results  ?Component Value Date  ? CHOL 157 12/09/2020  ? HDL 63 12/09/2020  ? [place] 79 12/09/2020  ? TRIG 78 12/09/2020  ? CHOLHDL 2.8 07/26/2019  ? ?Lab Results   ?Component Value Date  ? VD25OH 72.2 12/09/2020  ? VD25OH 53.6 07/24/2020  ? VD25OH 60.0 04/08/2020  ? ?Lab Results  ?Component Value Date  ? WBC 5.3 03/27/2021  ? HGB 13.5 03/27/2021  ? HCT 40.4 03/27/2021  ? MCV 85.9 03/27/2021  ? PLT 229.0 03/27/2021  ? ?No results found for: IRON, TIBC, FERRITIN ? ?Attestation Statements:  ? ?Reviewed by clinician on day of visit: allergies, medications, problem list, medical history, surgical history, family history, social history, and previous encounter notes. ? ?I, Lizbeth Bark, RMA, am acting as transcriptionist for [employer], DO. ? ?I have reviewed the above documentation for accuracy and completeness, and I agree with the above. Jearld Lesch, DO ? ?

## 2021-07-15 ENCOUNTER — Other Ambulatory Visit (HOSPITAL_COMMUNITY): Payer: Self-pay

## 2021-07-15 ENCOUNTER — Encounter (INDEPENDENT_AMBULATORY_CARE_PROVIDER_SITE_OTHER): Payer: Self-pay | Admitting: Bariatrics

## 2021-07-15 MED ORDER — POTASSIUM CHLORIDE ER 10 MEQ PO CPCR
10.0000 meq | ORAL_CAPSULE | Freq: Every day | ORAL | 4 refills | Status: DC
  Filled 2021-07-15: qty 30, 30d supply, fill #0
  Filled 2021-08-18: qty 30, 30d supply, fill #1
  Filled 2021-09-12: qty 30, 30d supply, fill #2
  Filled 2021-09-26: qty 30, 30d supply, fill #3
  Filled 2021-11-12: qty 30, 30d supply, fill #4

## 2021-07-16 ENCOUNTER — Other Ambulatory Visit (HOSPITAL_COMMUNITY): Payer: Self-pay

## 2021-07-28 ENCOUNTER — Other Ambulatory Visit (HOSPITAL_COMMUNITY): Payer: Self-pay

## 2021-08-11 ENCOUNTER — Other Ambulatory Visit (HOSPITAL_COMMUNITY): Payer: Self-pay

## 2021-08-12 ENCOUNTER — Ambulatory Visit (INDEPENDENT_AMBULATORY_CARE_PROVIDER_SITE_OTHER): Payer: PPO | Admitting: Bariatrics

## 2021-08-12 ENCOUNTER — Encounter (INDEPENDENT_AMBULATORY_CARE_PROVIDER_SITE_OTHER): Payer: Self-pay | Admitting: Bariatrics

## 2021-08-12 VITALS — BP 132/68 | HR 82 | Temp 97.8°F | Ht 69.0 in | Wt 215.0 lb

## 2021-08-12 DIAGNOSIS — R7303 Prediabetes: Secondary | ICD-10-CM

## 2021-08-12 DIAGNOSIS — I10 Essential (primary) hypertension: Secondary | ICD-10-CM | POA: Diagnosis not present

## 2021-08-12 DIAGNOSIS — E669 Obesity, unspecified: Secondary | ICD-10-CM

## 2021-08-12 DIAGNOSIS — Z6831 Body mass index (BMI) 31.0-31.9, adult: Secondary | ICD-10-CM

## 2021-08-13 NOTE — Progress Notes (Signed)
Chief Complaint:   OBESITY Richard [name] is here to discuss his progress with his obesity treatment plan along with follow-up of his obesity related diagnoses. Richard [name] is on the Category 2 Plan and keeping a food journal and adhering to recommended goals of 1200 calories and 80 grams of protein and states he is following his eating plan approximately 80% of the time. Richard [name] states he is playing golf for 2 hours 2 times per week and going to the gym for 30 minutes 2 times per week.  Today's visit was #: 20 Starting weight: 258 lbs Starting date: 04/08/2020 Today's weight: 215 lbs Today's date: 08/12/2021 Total lbs lost to date: 43 lbs Total lbs lost since last in-office visit: 2 lbs  Interim History: Richard [name] is down 2 lbs since his last visit and he is doing well overall. He feels like he is back on track.   Subjective:   1. Primary hypertension Richard [name] is taking Norvasc, Coreg, and chlorthalidone currently. His blood pressure is controlled. His blood pressure today is 132/68.  2. Prediabetes Richard [name] is currently taking [place].   Assessment/Plan:   1. Primary hypertension Knowledge will continue taking Norvasc, Coreg, and chlorthalidone. He is working on healthy weight loss and exercise to improve blood pressure control. We will watch for signs of hypotension as he continues his lifestyle modifications.  2. Prediabetes Richard [name] will continue taking [place]. He will stay on plan. He will continue exercise. Good blood sugar control is important to decrease the likelihood of diabetic complications such as nephropathy, neuropathy, limb loss, blindness, coronary artery disease, and death. Intensive lifestyle modification including diet, exercise and weight loss are the first line of treatment for diabetes.   3. Obesity with current BMI of 31.8 Richard [name] is currently in the action stage of change. As such, his goal is to continue with weight loss efforts. He has agreed to the Category 2 Plan and keeping a food  journal and adhering to recommended goals of 1200 calories and 80 grams of protein.   Richard [name] will continue meal planning and he will continue intentional eating. He will weigh his protein.   Exercise goals:  Richard [name] is playing golf and going to the gym.   Behavioral modification strategies: increasing lean protein intake, decreasing simple carbohydrates, increasing vegetables, increasing water intake, decreasing eating out, no skipping meals, meal planning and cooking strategies, keeping healthy foods in the home, and planning for success.  Richard [name] has agreed to follow-up with our clinic in 4-5 weeks. He was informed of the importance of frequent follow-up visits to maximize his success with intensive lifestyle modifications for his multiple health conditions.   Objective:   Blood pressure 132/68, pulse 82, temperature 97.8 F (36.6 C), height '5\' 9"'$  (1.753 m), weight 215 lb (97.5 kg), SpO2 96 %. Body mass index is 31.75 kg/m.  General: Cooperative, alert, well developed, in no acute distress. HEENT: Conjunctivae and lids unremarkable. Cardiovascular: Regular rhythm.  Lungs: Normal work of breathing. Neurologic: No focal deficits.   Lab Results  Component Value Date   CREATININE 0.86 06/10/2021   BUN 12 06/10/2021   NA 130 (L) 06/10/2021   K 4.2 06/10/2021   CL 91 (L) 06/10/2021   CO2 25 06/10/2021   Lab Results  Component Value Date   ALT 13 12/09/2020   AST 22 12/09/2020   ALKPHOS 124 (H) 12/09/2020   BILITOT 0.6 12/09/2020   Lab Results  Component Value Date   HGBA1C 6.0 (H) 12/09/2020   HGBA1C 6.3 (  H) 07/24/2020   HGBA1C 6.2 (H) 04/08/2020   HGBA1C 6.1 (H) 04/30/2013   Lab Results  Component Value Date   INSULIN 12.3 07/24/2020   INSULIN 5.3 04/08/2020   Lab Results  Component Value Date   TSH 1.01 03/27/2021   Lab Results  Component Value Date   CHOL 157 12/09/2020   HDL 63 12/09/2020   LDLCALC 79 12/09/2020   TRIG 78 12/09/2020   CHOLHDL 2.8 07/26/2019    Lab Results  Component Value Date   VD25OH 72.2 12/09/2020   VD25OH 53.6 07/24/2020   VD25OH 60.0 04/08/2020   Lab Results  Component Value Date   WBC 5.3 03/27/2021   HGB 13.5 03/27/2021   HCT 40.4 03/27/2021   MCV 85.9 03/27/2021   PLT 229.0 03/27/2021   No results found for: IRON, TIBC, FERRITIN  Attestation Statements:   Reviewed by clinician on day of visit: allergies, medications, problem list, medical history, surgical history, family history, social history, and previous encounter notes.  I, Lizbeth Bark, RMA, am acting as Location manager for [employer], DO.  I have reviewed the above documentation for accuracy and completeness, and I agree with the above. Jearld Lesch, DO

## 2021-08-18 ENCOUNTER — Other Ambulatory Visit (HOSPITAL_COMMUNITY): Payer: Self-pay

## 2021-08-18 ENCOUNTER — Encounter (INDEPENDENT_AMBULATORY_CARE_PROVIDER_SITE_OTHER): Payer: Self-pay | Admitting: Bariatrics

## 2021-09-07 ENCOUNTER — Other Ambulatory Visit: Payer: Self-pay | Admitting: Interventional Cardiology

## 2021-09-08 ENCOUNTER — Other Ambulatory Visit (HOSPITAL_COMMUNITY): Payer: Self-pay

## 2021-09-08 ENCOUNTER — Ambulatory Visit (INDEPENDENT_AMBULATORY_CARE_PROVIDER_SITE_OTHER): Payer: PPO | Admitting: Bariatrics

## 2021-09-08 ENCOUNTER — Encounter (INDEPENDENT_AMBULATORY_CARE_PROVIDER_SITE_OTHER): Payer: Self-pay | Admitting: Bariatrics

## 2021-09-08 VITALS — BP 151/84 | HR 71 | Temp 97.8°F | Ht 69.0 in | Wt 211.0 lb

## 2021-09-08 DIAGNOSIS — E669 Obesity, unspecified: Secondary | ICD-10-CM | POA: Diagnosis not present

## 2021-09-08 DIAGNOSIS — I10 Essential (primary) hypertension: Secondary | ICD-10-CM | POA: Diagnosis not present

## 2021-09-08 DIAGNOSIS — Z7984 Long term (current) use of oral hypoglycemic drugs: Secondary | ICD-10-CM

## 2021-09-08 DIAGNOSIS — R7303 Prediabetes: Secondary | ICD-10-CM

## 2021-09-08 DIAGNOSIS — Z6831 Body mass index (BMI) 31.0-31.9, adult: Secondary | ICD-10-CM | POA: Diagnosis not present

## 2021-09-08 MED ORDER — CARVEDILOL 12.5 MG PO TABS
12.5000 mg | ORAL_TABLET | Freq: Two times a day (BID) | ORAL | 1 refills | Status: DC
  Filled 2021-09-08 – 2021-09-26 (×2): qty 180, 90d supply, fill #0
  Filled 2022-01-12: qty 180, 90d supply, fill #1

## 2021-09-09 NOTE — Progress Notes (Unsigned)
Chief Complaint:   OBESITY Richard [name] is here to discuss his progress with his obesity treatment plan along with follow-up of his obesity related diagnoses. Richard [name] is on the Category 2 Plan and keeping a food journal and adhering to recommended goals of 1200 calories and 80 grams of protein daily and states he is following his eating plan approximately 85% of the time. Richard [name] states he is playing golf for 120 minutes and at the gym for 30 minutes 2 times per week.  Today's visit was #: 21 Starting weight: 258 lbs Starting date: 04/08/2020 Today's weight: 211 lbs Today's date: 09/08/2021 Total lbs lost to date: 47 Total lbs lost since last in-office visit: 4  Interim History: Richard [name] is down another 4 pounds and he is doing well overall.  His goal weight is between 195-200 pounds for the future.  Subjective:   1. Essential hypertension Richard [name] is taking Coreg, Norvasc, and [place].  His blood pressure is reasonably well controlled.  2. Pre-diabetes Richard [name] is taking [place] as directed.  Assessment/Plan:   1. Essential hypertension Richard [name] will continue his medications, and he will check his blood pressure at home as needed.  2. Pre-diabetes Richard [name] will continue his medications as directed.  3. Obesity with current BMI of 31.2 Richard [name] is currently in the action stage of change. As such, his goal is to continue with weight loss efforts. He has agreed to the Category 2 Plan or keeping a food journal and adhering to recommended goals of 1200 calories and 80 grams of protein daily.   Richard [name] will continue to follow the plan 80-90 %. Low carbohydrate snack ideas were given.  Exercise goals: As is.   Behavioral modification strategies: increasing lean protein intake, decreasing simple carbohydrates, increasing vegetables, increasing water intake, decreasing eating out, no skipping meals, meal planning and cooking strategies, keeping healthy foods in the home, and planning for success.  Richard [name] has  agreed to follow-up with our clinic in 4 weeks. He was informed of the importance of frequent follow-up visits to maximize his success with intensive lifestyle modifications for his multiple health conditions.   Objective:   Blood pressure (!) 151/84, pulse 71, temperature 97.8 F (36.6 C), height '5\' 9"'$  (1.753 m), weight 211 lb (95.7 kg), SpO2 99 %. Body mass index is 31.16 kg/m.  General: Cooperative, alert, well developed, in no acute distress. HEENT: Conjunctivae and lids unremarkable. Cardiovascular: Regular rhythm.  Lungs: Normal work of breathing. Neurologic: No focal deficits.   Lab Results  Component Value Date   CREATININE 0.86 06/10/2021   BUN 12 06/10/2021   NA 130 (L) 06/10/2021   K 4.2 06/10/2021   CL 91 (L) 06/10/2021   CO2 25 06/10/2021   Lab Results  Component Value Date   ALT 13 12/09/2020   AST 22 12/09/2020   ALKPHOS 124 (H) 12/09/2020   BILITOT 0.6 12/09/2020   Lab Results  Component Value Date   HGBA1C 6.0 (H) 12/09/2020   HGBA1C 6.3 (H) 07/24/2020   HGBA1C 6.2 (H) 04/08/2020   HGBA1C 6.1 (H) 04/30/2013   Lab Results  Component Value Date   INSULIN 12.3 07/24/2020   INSULIN 5.3 04/08/2020   Lab Results  Component Value Date   TSH 1.01 03/27/2021   Lab Results  Component Value Date   CHOL 157 12/09/2020   HDL 63 12/09/2020   LDLCALC 79 12/09/2020   TRIG 78 12/09/2020   CHOLHDL 2.8 07/26/2019   Lab Results  Component Value Date  VD25OH 72.2 12/09/2020   VD25OH 53.6 07/24/2020   VD25OH 60.0 04/08/2020   Lab Results  Component Value Date   WBC 5.3 03/27/2021   HGB 13.5 03/27/2021   HCT 40.4 03/27/2021   MCV 85.9 03/27/2021   PLT 229.0 03/27/2021   No results found for: "IRON", "TIBC", "FERRITIN"  Attestation Statements:   Reviewed by clinician on day of visit: allergies, medications, problem list, medical history, surgical history, family history, social history, and previous encounter notes.   Wilhemena Durie, am acting  as Location manager for [employer], DO.  I have reviewed the above documentation for accuracy and completeness, and I agree with the above. Jearld Lesch, DO

## 2021-09-10 ENCOUNTER — Encounter (INDEPENDENT_AMBULATORY_CARE_PROVIDER_SITE_OTHER): Payer: Self-pay | Admitting: Bariatrics

## 2021-09-12 ENCOUNTER — Other Ambulatory Visit (HOSPITAL_COMMUNITY): Payer: Self-pay

## 2021-09-26 ENCOUNTER — Other Ambulatory Visit: Payer: Self-pay | Admitting: Interventional Cardiology

## 2021-09-26 ENCOUNTER — Other Ambulatory Visit (HOSPITAL_COMMUNITY): Payer: Self-pay

## 2021-09-26 MED ORDER — CHLORTHALIDONE 25 MG PO TABS
12.5000 mg | ORAL_TABLET | Freq: Every day | ORAL | 1 refills | Status: DC
  Filled 2021-09-26: qty 45, 90d supply, fill #0
  Filled 2021-11-17 – 2021-12-29 (×2): qty 45, 90d supply, fill #1

## 2021-09-29 ENCOUNTER — Other Ambulatory Visit (HOSPITAL_COMMUNITY): Payer: Self-pay

## 2021-09-29 ENCOUNTER — Encounter: Payer: Self-pay | Admitting: Psychology

## 2021-09-29 DIAGNOSIS — Z860100 Personal history of colon polyps, unspecified: Secondary | ICD-10-CM | POA: Insufficient documentation

## 2021-09-29 DIAGNOSIS — K648 Other hemorrhoids: Secondary | ICD-10-CM | POA: Insufficient documentation

## 2021-09-29 DIAGNOSIS — E7800 Pure hypercholesterolemia, unspecified: Secondary | ICD-10-CM | POA: Insufficient documentation

## 2021-09-29 DIAGNOSIS — K625 Hemorrhage of anus and rectum: Secondary | ICD-10-CM | POA: Insufficient documentation

## 2021-09-29 DIAGNOSIS — I5030 Unspecified diastolic (congestive) heart failure: Secondary | ICD-10-CM | POA: Insufficient documentation

## 2021-09-29 DIAGNOSIS — K219 Gastro-esophageal reflux disease without esophagitis: Secondary | ICD-10-CM | POA: Insufficient documentation

## 2021-09-29 DIAGNOSIS — I10 Essential (primary) hypertension: Secondary | ICD-10-CM | POA: Insufficient documentation

## 2021-09-29 MED ORDER — MONTELUKAST SODIUM 10 MG PO TABS
10.0000 mg | ORAL_TABLET | Freq: Every day | ORAL | 3 refills | Status: DC
  Filled 2021-09-29: qty 90, 90d supply, fill #0
  Filled 2021-12-29: qty 90, 90d supply, fill #1
  Filled 2022-03-27: qty 90, 90d supply, fill #2
  Filled 2022-06-28: qty 90, 90d supply, fill #3

## 2021-09-29 MED ORDER — FLUTICASONE FUROATE-VILANTEROL 200-25 MCG/ACT IN AEPB
1.0000 | INHALATION_SPRAY | Freq: Every day | RESPIRATORY_TRACT | 5 refills | Status: DC
  Filled 2021-09-29: qty 60, 30d supply, fill #0
  Filled 2021-11-17: qty 60, 30d supply, fill #1
  Filled 2021-12-29: qty 60, 30d supply, fill #2
  Filled 2022-02-16: qty 60, 30d supply, fill #3
  Filled 2022-03-16: qty 90, 90d supply, fill #4
  Filled 2022-03-16: qty 180, 90d supply, fill #4
  Filled 2022-06-16: qty 60, 30d supply, fill #5
  Filled 2022-08-18: qty 60, 30d supply, fill #6

## 2021-09-30 ENCOUNTER — Ambulatory Visit: Payer: PPO

## 2021-09-30 ENCOUNTER — Encounter: Payer: Self-pay | Admitting: Psychology

## 2021-09-30 ENCOUNTER — Ambulatory Visit: Payer: PPO | Admitting: Psychology

## 2021-09-30 DIAGNOSIS — G3184 Mild cognitive impairment, so stated: Secondary | ICD-10-CM | POA: Diagnosis not present

## 2021-09-30 DIAGNOSIS — R4189 Other symptoms and signs involving cognitive functions and awareness: Secondary | ICD-10-CM

## 2021-09-30 HISTORY — DX: Mild cognitive impairment of uncertain or unknown etiology: G31.84

## 2021-09-30 NOTE — Progress Notes (Signed)
   Psychometrician Note   Cognitive testing was administered to [employer] by Cruzita Lederer, B.S. (psychometrist) under the supervision of Dr. Christia Reading, Ph.D., licensed psychologist on 09/30/2021. Mr. Conant did not appear overtly distressed by the testing session per behavioral observation or responses across self-report questionnaires. Rest breaks were offered.    The battery of tests administered was selected by Dr. Christia Reading, Ph.D. with consideration to Mr. Culotta's current level of functioning, the nature of his symptoms, emotional and behavioral responses during interview, level of literacy, observed level of motivation/effort, and the nature of the referral question. This battery was communicated to the psychometrist. Communication between Dr. Christia Reading, Ph.D. and the psychometrist was ongoing throughout the evaluation and Dr. Christia Reading, Ph.D. was immediately accessible at all times. Dr. Christia Reading, Ph.D. provided supervision to the psychometrist on the date of this service to the extent necessary to assure the quality of all services provided.    Gerda Diss Chopra will return within approximately 1-2 weeks for an interactive feedback session with Dr. Melvyn Novas at which time his test performances, clinical impressions, and treatment recommendations will be reviewed in detail. Mr. Riviera understands he can contact our office should he require our assistance before this time.  A total of 135 minutes of billable time were spent face-to-face with Mr. Ballen by the psychometrist. This includes both test administration and scoring time. Billing for these services is reflected in the clinical report generated by Dr. Christia Reading, Ph.D.  This note reflects time spent with the psychometrician and does not include test scores or any clinical interpretations made by Dr. Melvyn Novas. The full report will follow in a separate note.

## 2021-09-30 NOTE — Progress Notes (Signed)
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 Comprehension 57 75 Above Average    Naming 31/31 (59) 82 Above Average       Visuospatial/Visuoconstruction:      Raw Score Percentile   Clock Drawing: 8/10 --- Within Normal Limits       NAB Spatial Module, Form 1: T Score Percentile     Visual Discrimination 33 5 Well Below Average        Scaled Score Percentile   WAIS-IV Block Design: 9 37 Average       Mood and Personality:      Raw Score Percentile   Geriatric Depression Scale: 7 --- Within Normal Limits  Geriatric Anxiety Scale: 2 --- Minimal    Somatic 1 --- Minimal    Cognitive 1 --- Minimal    Affective 0 --- Minimal       Additional Questionnaires:      Raw Score Percentile   PROMIS Sleep Disturbance Questionnaire: 21 --- None to Slight   Informed Consent and Coding/Compliance:   The current evaluation represents  a clinical evaluation for the purposes  previously outlined by the referral source and is in no way reflective of a forensic evaluation.   Mr. Fallin was provided with a verbal description of the nature and purpose of the present neuropsychological evaluation. Also reviewed were the foreseeable risks and/or discomforts and benefits of the procedure, limits of confidentiality, and mandatory reporting requirements of this provider. The patient was given the opportunity to ask questions and receive answers about the evaluation. Oral consent to participate was provided by the patient.   This evaluation was conducted by Christia Reading, Ph.D., ABPP-CN, board certified clinical neuropsychologist. Mr. Aguayo completed a clinical interview with Dr. Melvyn Novas, billed as one unit [phone], and 135 minutes of cognitive testing and scoring, billed as one unit [phone] and four additional units 96139. Psychometrist Cruzita Lederer, B.S., assisted Dr. Melvyn Novas with test administration and scoring procedures. As a separate and discrete service, Dr. Melvyn Novas spent a total of 160 minutes in interpretation and report writing billed as one unit [phone] and two units 96133.

## 2021-10-07 ENCOUNTER — Ambulatory Visit (INDEPENDENT_AMBULATORY_CARE_PROVIDER_SITE_OTHER): Payer: PPO | Admitting: Psychology

## 2021-10-07 ENCOUNTER — Ambulatory Visit (INDEPENDENT_AMBULATORY_CARE_PROVIDER_SITE_OTHER): Payer: PPO | Admitting: Bariatrics

## 2021-10-07 DIAGNOSIS — H9190 Unspecified hearing loss, unspecified ear: Secondary | ICD-10-CM | POA: Diagnosis not present

## 2021-10-07 DIAGNOSIS — G3184 Mild cognitive impairment, so stated: Secondary | ICD-10-CM

## 2021-10-07 NOTE — Progress Notes (Signed)
   Neuropsychology Feedback Session Richard [name]. Richard [name] Department of Neurology  Reason for Referral:   Richard [name] is a 76 y.o. right-handed African-American male referred by  Sharene Butters, PA-C , to characterize his current cognitive functioning and assist with diagnostic clarity and treatment planning in the context of subjective cognitive decline and an extensive family history of Alzheimer's disease.   Feedback:   Richard [name] completed a comprehensive neuropsychological evaluation on 09/30/2021. Please refer to that encounter for the full report and recommendations. Briefly, results suggested a primary impairment across encoding (i.e., learning) aspects of verbal memory. Additional performance variability was exhibited across executive functioning, visuospatial abilities, and retrieval aspects of memory. The etiology of ongoing dysfunction is unclear at the present time. Specific to his concerns surrounding Alzheimer's disease, current testing does not yield a pattern consistent with typical presentations of this illness. Delayed retrieval of previously learned information was improved relative to initial learning abilities from a normative standpoint. Performances were also appropriate across recognition trials and not suggestive of rapid forgetting or a notable storage deficit. Impairments encoding novel information could be related to hearing loss given that both he and his wife reported ongoing concerns and that he likely needs a formal hearing evaluation. Deficits surrounding executive functioning and visuospatial abilities can be seen in certain neurodegenerative illnesses early on, specifically Lewy body disease and corticobasal degeneration. However, Richard [name] does not currently display any behavioral features of these illnesses (e.g., REM sleep behaviors, fully-formed hallucinations, myoclonic jerking, alien limb experiences, gait abnormalities,  rigidity/bradykinesia). While this makes these conditions less likely, continued monitoring will be important, especially if any of these symptoms emerge in the future.  Richard [name] was accompanied by his wife during the current telephone call. He was within his residence while I was within my office. I discussed the limitations of evaluation and management by telemedicine and the availability of in person appointments. Richard [name] expressed his understanding and agreed to proceed. Content of the current session focused on the results of his neuropsychological evaluation. Richard [name] was given the opportunity to ask questions and his questions were answered. He was encouraged to reach out should additional questions arise. His report is available to him on MyChart.      18 minutes were spent conducting the current feedback session with Richard [name], billed as one unit [phone].

## 2021-10-13 ENCOUNTER — Other Ambulatory Visit (HOSPITAL_COMMUNITY): Payer: Self-pay

## 2021-10-14 ENCOUNTER — Encounter: Payer: Self-pay | Admitting: Physician Assistant

## 2021-10-14 ENCOUNTER — Ambulatory Visit: Payer: PPO | Admitting: Physician Assistant

## 2021-10-14 VITALS — BP 139/77 | HR 88 | Resp 20 | Ht 68.0 in | Wt 202.0 lb

## 2021-10-14 DIAGNOSIS — G3184 Mild cognitive impairment, so stated: Secondary | ICD-10-CM | POA: Diagnosis not present

## 2021-10-14 DIAGNOSIS — H9193 Unspecified hearing loss, bilateral: Secondary | ICD-10-CM

## 2021-10-14 NOTE — Progress Notes (Signed)
Assessment/Plan:   Mild Cognitive Impairment   Richard [name] is a very pleasant 76 y.o. RH male  with extensive cardiac history, OSA on BiPAP, low B12, and strong family history of Alzheimer's disease, presenting in follow-up for evaluation of memory difficulties.  Last MoCA on 03/27/2021 was 26/30.  MRI was unremarkable for acute findings, with mild chronic small vessel disease.   Recommendations:    Continue donepezil 10 mg daily Side effects were discussed Follow up in   months.   Case discussed with Dr. Delice Lesch who agrees with Richard plan     Subjective:   This [name] is accompanied in Richard office by male who supplements Richard history.  Previous records as well as any outside records available were reviewed prior to todays visit.    [name] is currently on    Any changes in memory since last visit? [name] lives with:  repeats oneself?  Endorsed Disoriented when walking into a room?  [name] denies   Leaving objects in unusual places?  [name] denies   Ambulates  with difficulty?   [name] denies   Recent falls?  [name] denies   Any head injuries?  [name] denies   History of seizures?   [name] denies   Wandering behavior?  [name] denies   [name] drives?   [name] no longer drives  Any mood changes since last visit?  [name] denies   Any worsening depression?:  [name] denies   Hallucinations?  [name] denies   Paranoia?  [name] denies   [name] reports that male  sleeps well without vivid dreams, REM behavior or sleepwalking   History of sleep apnea?  Endorsed, uses CPAP Any hygiene concerns?  [name] denies   Independent of bathing and dressing?  Endorsed  Does Richard [name] needs help with medications?  [name] is in charge. He places his medications in a pillbox Who is in charge of Richard finances?  Wife is in charge she always did Richard finances. Any changes in appetite?  [name] denies   [name] have trouble swallowing? [name] denies   Does Richard [name]  cook?  [name] denies   Any kitchen accidents such as leaving Richard stove on? [name] denies   Any headaches?  [name] denies   Double vision? [name] denies   Any focal numbness or tingling?  [name] denies   Chronic back pain [name] denies   Unilateral weakness?  [name] denies   Any tremors?  [name] denies   Any history of anosmia?  [name] denies   Any incontinence of urine?  [name] denies   Any bowel dysfunction?   [name] denies          Initial Visit 03/27/21 Richard [name] is seen in neurologic consultation at Richard request of Vincente Liberty, MD for Richard evaluation of memory.  Richard [name] is accompanied by his wife who supplements Richard history. This is a 76 y.o. year old RH  male who has had memory issues for about "a while" during Richard visit to their daughter in [place], she noticed that there was a mild decline in his father's cognition.  He was asking Richard same questions within minutes, and if confronted about it, he would become defensive.  He is less detail oriented than before.  He denies repeating Richard same stories.  Sometimes, "he cannot find what he is looking for "when he gets to a room.  His mood may be changing, every day he does his wife "has not been a good day, has not started well "but  when asked to elaborate, he "snaps "he continues to drive without any issues.  He enjoys doing crossword puzzles and word finding.  He sleeps well, except when he has to get up to urinate which is between 3 and 4 times a night.  His wife states that he tosses and turns and his hands move during Richard night, especially than in Richard last 8 months, but he denies having any vivid dreams.  He snores even with CPAP.  He denies any sleepwalking.  He denies any hallucinations or paranoia.  No hygiene concerns.  His medications are in a pillbox, he denies forgetting any doses.  His wife always did Richard finances.  His appetite is good, denies trouble swallowing.  Over Richard last 8 months, he has been in and controlled  diet, but he discontinued all forms of sodium intake, which in turn turned to be provoking hyponatremia.  "He is all or nothing-his wife says ".  He cooks and denies leaving Richard stove on accidentally or forgetting common recipe.  He denies any head injuries, falls, seizure, or history of headaches.  He denies any vision changes, dizziness, focal numbness or tingling, unilateral weakness or tremors or anosmia.  No reported incontinence, retention, constipation or diarrhea.  He denies any alcohol, or tobacco.  Family history strong for Alzheimer's disease in mother, and her 38 sisters.  He is retired from [employer], he retired 4 years ago   Neuropsych evaluation 10/07/2021 briefly, results suggested a primary impairment across encoding (i.e., learning) aspects of verbal memory. Additional performance variability was exhibited across executive functioning, visuospatial abilities, and retrieval aspects of memory. Richard etiology of ongoing dysfunction is unclear at Richard present time. Specific to his concerns surrounding Alzheimer's disease, current testing does not yield a pattern consistent with typical presentations of this illness. Delayed retrieval of previously learned information was improved relative to initial learning abilities from a normative standpoint. Performances were also appropriate across recognition trials and not suggestive of rapid forgetting or a notable storage deficit. Impairments encoding novel information could be related to hearing loss given that both he and his wife reported ongoing concerns and that he likely needs a formal hearing evaluation. Deficits surrounding executive functioning and visuospatial abilities can be seen in certain neurodegenerative illnesses early on, specifically Lewy body disease and corticobasal degeneration. However, Mr. Mooneyhan does not currently display any behavioral features of these illnesses (e.g., REM sleep behaviors, fully-formed  hallucinations, myoclonic jerking, alien limb experiences, gait abnormalities, rigidity/bradykinesia). While this makes these conditions less likely, continued monitoring will be important, especially if any of these symptoms emerge in Richard future.        MRI brain 04/26/21 No acute intracranial abnormality. Mild for age signal changes.suggestive of chronic small vessel disease. 2. 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 Tamala Julian, at [facility], yearly visits   OSA treated with BiPAP 04/24/2018   Mild OSA with an AHI of 13.5/hr and O2 sats of 88%. Now on BiPAP at 13/9cm H2O.    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   Prediabetes 04/09/2020   Pure hypercholesterolemia    Right bundle branch block 06/27/2014    EKG performed 06/27/2014   Vitamin D deficiency      Past Surgical History:  Procedure Laterality Date   CARDIAC CATHETERIZATION  2004   with 2 stents   CARDIOVASCULAR STRESS TEST  2011   COLONOSCOPY      POLYPECTOMY  02/17/2011   Procedure: POLYPECTOMY NASAL;  Surgeon: Delsa Bern;  Location: [facility];  Service: ENT;  Laterality: N/A;   SINUS ENDO W/FUSION  02/17/2011   Procedure: ENDOSCOPIC SINUS SURGERY WITH FUSION NAVIGATION;  Surgeon: Delsa Bern;  Location: [facility];  Service: ENT;  Laterality: N/A;   TONSILLECTOMY     US ECHOCARDIOGRAPHY       PREVIOUS MEDICATIONS:   CURRENT MEDICATIONS:  Outpatient Encounter Medications as of 10/14/2021  Medication Sig   amLODipine (NORVASC) 5 MG tablet Take 1 tablet (5 mg total) by mouth daily.   aspirin EC 81 MG tablet Take 1 tablet (81 mg total) by mouth daily.   atorvastatin (LIPITOR) 80 MG tablet Take 1 tablet (80 mg total) by mouth at bedtime.   carvedilol (COREG) 12.5 MG tablet Take 1 tablet (12.5 mg total) by mouth 2 (two) times daily.   chlorthalidone (HYGROTON) 25 MG tablet Take 0.5 tablets (12.5 mg total) by mouth daily.   COVID-19 mRNA Vac-TriS, Pfizer, (PFIZER-BIONT COVID-19 VAC-TRIS) SUSP injection Inject into Richard muscle.   dapagliflozin propanediol (FARXIGA) 10 MG TABS tablet Take 1 tablet (10 mg total) by mouth daily before breakfast.   dapagliflozin propanediol (FARXIGA) 10 MG TABS tablet 1 tablet in Richard morning   ergocalciferol (VITAMIN D2) 1.25 MG (50000 UT) capsule Take 1 capsule (50,000 Units total) by mouth once a week.   ezetimibe (ZETIA) 10 MG tablet Take 1 tablet (10 mg total) by mouth daily.   fluticasone furoate-vilanterol (BREO ELLIPTA) 200-25 MCG/ACT AEPB Inhale 1 puff into Richard lungs daily.   fluticasone furoate-vilanterol (BREO ELLIPTA) 200-25 MCG/ACT AEPB Inhale 1 puff into Richard lungs daily.   fluticasone furoate-vilanterol (BREO ELLIPTA) 200-25 MCG/INH AEPB Inhale 1 puff into Richard lungs daily.   guaiFENesin (MUCINEX) 600 MG 12 hr tablet Take by mouth 2 (two) times daily.   ipratropium-albuterol (DUONEB) 0.5-2.5 (3) MG/3ML SOLN USE 1 VIAL VIA NEBULIZER 2 TIMES DAILY & AT BEDTIME IF NEEDED   ipratropium-albuterol  (DUONEB) 0.5-2.5 (3) MG/3ML SOLN Take 3 mLs by nebulization 2 times daily and if needed once at bedtime   montelukast (SINGULAIR) 10 MG tablet Take 1 tablet (10 mg total) by mouth daily.   nitroGLYCERIN (NITROSTAT) 0.4 MG SL tablet Place 1 tablet (0.4 mg total) under Richard tongue every 5 (five) minutes as needed. For chest pain   omeprazole (PRILOSEC) 40 MG capsule TAKE 1 CAPSULE BY MOUTH TWICE DAILY.   omeprazole (PRILOSEC) 40 MG capsule Take 1 capsule (40 mg total) by mouth 2 (two)  times daily.   polyethylene glycol powder (GLYCOLAX/MIRALAX) 17 GM/SCOOP powder See admin instructions.   polyethylene glycol-electrolytes (NULYTELY) 420 g solution Use as directed   potassium chloride (MICRO-K) 10 MEQ CR capsule Take 1 capsule (10 mEq total) by mouth daily.   tamsulosin (FLOMAX) 0.4 MG CAPS capsule Take 1 capsule (0.4 mg total) by mouth daily.   No facility-administered encounter medications on file as of 10/14/2021.     Objective:     PHYSICAL EXAMINATION:    VITALS:  There were no vitals filed for this visit.  GEN:  Richard [name] appears stated age and is in NAD. HEENT:  Normocephalic, atraumatic.   Neurological examination:  General: NAD, well-groomed, appears stated age. Orientation: Richard [name] is alert. Oriented to person, place and date Cranial nerves: There is good facial symmetry.Richard speech is fluent and clear. No aphasia or dysarthria. Fund of knowledge is appropriate. Recent memory impaired and remote memory is normal.  Attention and concentration are normal.  Able to name objects and repeat phrases.  Hearing is intact to conversational tone.    Sensation: Sensation is intact to light touch throughout Motor: Strength is at least antigravity x4. Tremors: none  DTR's 2/4 in UE/LE      03/27/2021   10:00 AM  Montreal Cognitive Assessment   Visuospatial/ Executive (0/5) 2  Naming (0/3) 3  Attention: Read list of digits (0/2) 2  Attention: Read list of letters (0/1) 1   Attention: Serial 7 subtraction starting at 100 (0/3) 3  Language: Repeat phrase (0/2) 2  Language : Fluency (0/1) 1  Abstraction (0/2) 2  Delayed Recall (0/5) 4  Orientation (0/6) 6  Total 26        No data to display             Movement examination: Tone: There is normal tone in Richard UE/LE Abnormal movements:  no tremor.  No myoclonus.  No asterixis.   Coordination:  There is no decremation with RAM's. Normal finger to nose  Gait and Station: Richard [name] has no difficulty arising out of a deep-seated chair without Richard use of Richard hands. Richard [name]'s stride length is good.  Gait is cautious and narrow.   Thank you for allowing [place] Richard opportunity to participate in Richard care of this nice [name]. Please do not hesitate to contact us for any questions or concerns.   Total time spent on today's visit was *** minutes dedicated to this [name] today, preparing to see [name], examining Richard [name], ordering tests and/or medications and counseling Richard [name], documenting clinical information in Richard EHR or other health record, independently interpreting results and communicating results to Richard [name]/family, discussing treatment and goals, answering [name]'s questions and coordinating care.  Cc:  Vincente Liberty, MD  Sharene Butters 10/14/2021 7:03 AM

## 2021-10-14 NOTE — Patient Instructions (Signed)
It was a pleasure to see you today at our office.   Recommendations:  Follow up in  6 months Referral to ENT for hearing aids for sinus disease  Whom to call:  Memory  decline, memory medications: Call our office [phone]   For psychiatric meds, mood meds: Please have your primary care physician manage these medications.   Counseling regarding caregiver distress, including caregiver depression, anxiety and issues regarding community resources, adult day care programs, adult living facilities, or memory care questions:   Feel free to contact [place], Social Worker at [phone]   For assessment of decision of mental capacity and competency:  Call Dr. Anthoney Harada, geriatric psychiatrist at [phone]  For guidance in geriatric dementia issues please call [facility] [phone]    Feel free to go to the following database for funded clinical studies conducted around the world: http://saunders.com/   https://www.triadclinicaltrials.com/     RECOMMENDATIONS FOR ALL PATIENTS WITH MEMORY PROBLEMS: 1. Continue to exercise (Recommend 30 minutes of walking everyday, or 3 hours every week) 2. Increase social interactions - continue going to [place] and enjoy social gatherings with friends and family 3. Eat healthy, avoid fried foods and eat more fruits and vegetables 4. Maintain adequate blood pressure, blood sugar, and blood cholesterol level. Reducing the risk of stroke and cardiovascular disease also helps promoting better memory. 5. Avoid stressful situations. Live a simple life and avoid aggravations. Organize your time and prepare for the next day in anticipation. 6. Sleep well, avoid any interruptions of sleep and avoid any distractions in the bedroom that may interfere with adequate sleep quality 7. Avoid sugar, avoid sweets as there is a strong link between excessive sugar intake, diabetes, and cognitive impairment We discussed the  Mediterranean diet, which has been shown to help patients reduce the risk of progressive memory disorders and reduces cardiovascular risk. This includes eating fish, eat fruits and green leafy vegetables, nuts like almonds and hazelnuts, walnuts, and also use olive oil. Avoid fast foods and fried foods as much as possible. Avoid sweets and sugar as sugar use has been linked to worsening of memory function.  There is always a concern of gradual progression of memory problems. If this is the case, then we may need to adjust level of care according to patient needs. Support, both to the patient and caregiver, should then be put into place.    FALL PRECAUTIONS: Be cautious when walking. Scan the area for obstacles that may increase the risk of trips and falls. When getting up in the mornings, sit up at the edge of the bed for a few minutes before getting out of bed. Consider elevating the bed at the head end to avoid drop of blood pressure when getting up. Walk always in a well-lit room (use night lights in the walls). Avoid area rugs or power cords from appliances in the middle of the walkways. Use a walker or a cane if necessary and consider physical therapy for balance exercise. Get your eyesight checked regularly.  FINANCIAL OVERSIGHT: Supervision, especially oversight when making financial decisions or transactions is also recommended.  HOME SAFETY: Consider the safety of the kitchen when operating appliances like stoves, microwave oven, and blender. Consider having supervision and share cooking responsibilities until no longer able to participate in those. Accidents with firearms and other hazards in the house should be identified and addressed as well.   ABILITY TO BE LEFT ALONE: If patient is unable to contact 911 operator, consider  using [employer], or when the need is there, arrange for someone to stay with patients. Smoking is a fire hazard, consider supervision or cessation. Risk of wandering  should be assessed by caregiver and if detected at any point, supervision and safe proof recommendations should be instituted.  MEDICATION SUPERVISION: Inability to self-administer medication needs to be constantly addressed. Implement a mechanism to ensure safe administration of the medications.   DRIVING: Regarding driving, in patients with progressive memory problems, driving will be impaired. We advise to have someone else do the driving if trouble finding directions or if minor accidents are reported. Independent driving assessment is available to determine safety of driving.   If you are interested in the driving assessment, you can contact the following:  [employer] in [place]  [place] [phone]  [place]  [facility] [phone] or [phone]

## 2021-10-15 ENCOUNTER — Encounter (INDEPENDENT_AMBULATORY_CARE_PROVIDER_SITE_OTHER): Payer: Self-pay

## 2021-10-23 ENCOUNTER — Other Ambulatory Visit (HOSPITAL_COMMUNITY): Payer: Self-pay

## 2021-10-27 ENCOUNTER — Ambulatory Visit (INDEPENDENT_AMBULATORY_CARE_PROVIDER_SITE_OTHER): Payer: PPO | Admitting: Bariatrics

## 2021-10-28 ENCOUNTER — Ambulatory Visit: Payer: PPO | Admitting: Physician Assistant

## 2021-10-29 ENCOUNTER — Encounter (INDEPENDENT_AMBULATORY_CARE_PROVIDER_SITE_OTHER): Payer: Self-pay | Admitting: Bariatrics

## 2021-10-29 ENCOUNTER — Ambulatory Visit (INDEPENDENT_AMBULATORY_CARE_PROVIDER_SITE_OTHER): Payer: PPO | Admitting: Bariatrics

## 2021-10-29 VITALS — BP 135/69 | HR 81 | Temp 98.1°F | Ht 69.0 in | Wt 212.0 lb

## 2021-10-29 DIAGNOSIS — E669 Obesity, unspecified: Secondary | ICD-10-CM | POA: Diagnosis not present

## 2021-10-29 DIAGNOSIS — I10 Essential (primary) hypertension: Secondary | ICD-10-CM | POA: Diagnosis not present

## 2021-10-29 DIAGNOSIS — E7800 Pure hypercholesterolemia, unspecified: Secondary | ICD-10-CM | POA: Diagnosis not present

## 2021-10-29 DIAGNOSIS — Z6831 Body mass index (BMI) 31.0-31.9, adult: Secondary | ICD-10-CM

## 2021-11-03 NOTE — Progress Notes (Unsigned)
Chief Complaint:   OBESITY Richard [name] is here to discuss his progress with his obesity treatment plan along with follow-up of his obesity related diagnoses. Richard [name] is on the Category 2 Plan and keeping a food journal and adhering to recommended goals of 1200 calories and 80 gms protein and states he is following his eating plan approximately 70-75% of the time. Richard [name] states he is golfing for 3 hours 3 times per week and working out at [employer] for 30 minutes 3 times per week.  Today's visit was #: 57 Starting weight: 258 lbs Starting date: 04/08/2020 Today's weight: 212 lbs Today's date: 10/29/21 Total lbs lost to date: 46 Total lbs lost since last in-office visit: +1  Interim History: He is up 1 pound since his last visit but has been doing well overall.  He has been on vacation and had more celebrations.  He is getting in water and protein.  Subjective:   1. Benign essential hypertension Controlled.  2. Pure hypercholesterolemia Taking Lipitor.  Assessment/Plan:   1. Benign essential hypertension Continue medications.  2. Pure hypercholesterolemia 1.  Continue medication. 2.  Minimize unhealthy fats.  3. Obesity with current BMI of 31.4 1.  Meal planning 2.  Mindful eating 3.  Will increase raw vegetables.  Richard [name] is currently in the action stage of change. As such, his goal is to continue with weight loss efforts. He has agreed to the Category 2 Plan and keeping a food journal and adhering to recommended goals of 1200 calories and 80 gms protein.   Exercise goals: Continue to play golf, will get back to his weights.  Behavioral modification strategies: increasing lean protein intake, decreasing simple carbohydrates, increasing vegetables, increasing water intake, decreasing eating out, no skipping meals, meal planning and cooking strategies, and keeping healthy foods in the home.  Richard [name] has agreed to follow-up with our clinic in 4 weeks with Dr. Valetta Close. He was informed of  the importance of frequent follow-up visits to maximize his success with intensive lifestyle modifications for his multiple health conditions.   Objective:   Blood pressure 135/69, pulse 81, temperature 98.1 F (36.7 C), height '5\' 9"'$  (1.753 m), weight 212 lb (96.2 kg), SpO2 97 %. Body mass index is 31.31 kg/m.  General: Cooperative, alert, well developed, in no acute distress. HEENT: Conjunctivae and lids unremarkable. Cardiovascular: Regular rhythm.  Lungs: Normal work of breathing. Neurologic: No focal deficits.   Lab Results  Component Value Date   CREATININE 0.86 06/10/2021   BUN 12 06/10/2021   NA 130 (L) 06/10/2021   K 4.2 06/10/2021   CL 91 (L) 06/10/2021   CO2 25 06/10/2021   Lab Results  Component Value Date   ALT 13 12/09/2020   AST 22 12/09/2020   ALKPHOS 124 (H) 12/09/2020   BILITOT 0.6 12/09/2020   Lab Results  Component Value Date   HGBA1C 6.0 (H) 12/09/2020   HGBA1C 6.3 (H) 07/24/2020   HGBA1C 6.2 (H) 04/08/2020   HGBA1C 6.1 (H) 04/30/2013   Lab Results  Component Value Date   INSULIN 12.3 07/24/2020   INSULIN 5.3 04/08/2020   Lab Results  Component Value Date   TSH 1.01 03/27/2021   Lab Results  Component Value Date   CHOL 157 12/09/2020   HDL 63 12/09/2020   LDLCALC 79 12/09/2020   TRIG 78 12/09/2020   CHOLHDL 2.8 07/26/2019   Lab Results  Component Value Date   VD25OH 72.2 12/09/2020   VD25OH 53.6 07/24/2020  VD25OH 60.0 04/08/2020   Lab Results  Component Value Date   WBC 5.3 03/27/2021   HGB 13.5 03/27/2021   HCT 40.4 03/27/2021   MCV 85.9 03/27/2021   PLT 229.0 03/27/2021   No results found for: "IRON", "TIBC", "FERRITIN"   Attestation Statements:   Reviewed by clinician on day of visit: allergies, medications, problem list, medical history, surgical history, family history, social history, and previous encounter notes.  I, Dawn Whitmire, FNP-C, am acting as transcriptionist for Dr. Jearld Lesch.  I have reviewed the  above documentation for accuracy and completeness, and I agree with the above. Jearld Lesch, DO

## 2021-11-04 ENCOUNTER — Encounter (INDEPENDENT_AMBULATORY_CARE_PROVIDER_SITE_OTHER): Payer: Self-pay | Admitting: Bariatrics

## 2021-11-12 ENCOUNTER — Other Ambulatory Visit (HOSPITAL_COMMUNITY): Payer: Self-pay

## 2021-11-14 ENCOUNTER — Other Ambulatory Visit (HOSPITAL_COMMUNITY): Payer: Self-pay

## 2021-11-15 LAB — GLUCOSE, POCT (MANUAL RESULT ENTRY): POC Glucose: 132 mg/dl — AB (ref 70–99)

## 2021-11-17 ENCOUNTER — Other Ambulatory Visit (HOSPITAL_COMMUNITY): Payer: Self-pay

## 2021-11-17 MED ORDER — ERGOCALCIFEROL 1.25 MG (50000 UT) PO CAPS
50000.0000 [IU] | ORAL_CAPSULE | ORAL | 4 refills | Status: DC
  Filled 2021-11-17: qty 4, 28d supply, fill #0
  Filled 2022-01-12: qty 4, 28d supply, fill #1
  Filled 2022-03-27: qty 4, 28d supply, fill #2
  Filled 2022-05-18: qty 4, 28d supply, fill #3
  Filled 2022-07-20: qty 4, 28d supply, fill #4

## 2021-11-21 ENCOUNTER — Other Ambulatory Visit (HOSPITAL_COMMUNITY): Payer: Self-pay

## 2021-11-24 ENCOUNTER — Other Ambulatory Visit (HOSPITAL_COMMUNITY): Payer: Self-pay

## 2021-12-01 ENCOUNTER — Ambulatory Visit (INDEPENDENT_AMBULATORY_CARE_PROVIDER_SITE_OTHER): Payer: PPO | Admitting: Family Medicine

## 2021-12-02 ENCOUNTER — Encounter (INDEPENDENT_AMBULATORY_CARE_PROVIDER_SITE_OTHER): Payer: Self-pay | Admitting: Family Medicine

## 2021-12-02 ENCOUNTER — Ambulatory Visit (INDEPENDENT_AMBULATORY_CARE_PROVIDER_SITE_OTHER): Payer: PPO | Admitting: Family Medicine

## 2021-12-02 VITALS — BP 153/83 | HR 69 | Temp 98.8°F | Ht 69.0 in | Wt 209.0 lb

## 2021-12-02 DIAGNOSIS — R7303 Prediabetes: Secondary | ICD-10-CM

## 2021-12-02 DIAGNOSIS — I10 Essential (primary) hypertension: Secondary | ICD-10-CM

## 2021-12-02 DIAGNOSIS — E669 Obesity, unspecified: Secondary | ICD-10-CM

## 2021-12-02 DIAGNOSIS — Z6831 Body mass index (BMI) 31.0-31.9, adult: Secondary | ICD-10-CM | POA: Diagnosis not present

## 2021-12-02 HISTORY — DX: Essential (primary) hypertension: I10

## 2021-12-04 NOTE — Progress Notes (Signed)
Chief Complaint:   OBESITY Richard [name] is here to discuss his progress with his obesity treatment plan along with follow-up of his obesity related diagnoses. Richard [name] is on the Category 2 Plan and keeping a food journal and adhering to recommended goals of 1200 calories and 80 protein and states he is following his eating plan approximately 70% of the time. Richard [name] states he is golf, and gym exercise 60 minutes 2 times per week.  Today's visit was #: 23 Starting weight: 258 lbs Starting date: 04/08/2021 Today's weight: 209 lbs Today's date: 10/29/2021 Total lbs lost to date: 46 lbs Total lbs lost since last in-office visit: 3 lbs  Interim History: Getting meals in each day.  No meal skipping.  Doing cardio and some free weights 2 times per week.  Plans to increase gym frequency.  Good support.  Getting some fruits and veggies in.  Denies hunger and still has some cravings.   Subjective:   1. Essential hypertension He is on Amlodipine 5 mg, carvedilol 12.5 BID, chlorthalidone 1/2 tablet daily.  Home blood pressures 130's/70's.    2. Pre-diabetes Patient had A1c done with PCP, not available for review.  Last A1c 6.0.   Assessment/Plan:   1. Essential hypertension Continue current BP medications per PCP.    2. Pre-diabetes Continue Farxiga 10 mg per PCP.  Bring in copy of A1c from PCP office.   3. Obesity,current BMI 31.0 Repeat IC test next visit.   Richard [name] is currently in the action stage of change. As such, his goal is to continue with weight loss efforts. He has agreed to the Category 2 Plan.   Exercise goals:  Gym 3 times per week.   Behavioral modification strategies: increasing lean protein intake, increasing vegetables, increasing water intake, decreasing eating out, no skipping meals, meal planning and cooking strategies, better snacking choices, and decreasing junk food.  Richard [name] has agreed to follow-up with our clinic in 4 weeks. He was informed of the importance of frequent  follow-up visits to maximize his success with intensive lifestyle modifications for his multiple health conditions.   Objective:   Blood pressure (!) 153/83, pulse 69, temperature 98.8 F (37.1 C), height '5\' 9"'$  (1.753 m), weight 209 lb (94.8 kg), SpO2 100 %. Body mass index is 30.86 kg/m.  General: Cooperative, alert, well developed, in no acute distress. HEENT: Conjunctivae and lids unremarkable. Cardiovascular: Regular rhythm.  Lungs: Normal work of breathing. Neurologic: No focal deficits.   Lab Results  Component Value Date   CREATININE 0.86 06/10/2021   BUN 12 06/10/2021   NA 130 (L) 06/10/2021   K 4.2 06/10/2021   CL 91 (L) 06/10/2021   CO2 25 06/10/2021   Lab Results  Component Value Date   ALT 13 12/09/2020   AST 22 12/09/2020   ALKPHOS 124 (H) 12/09/2020   BILITOT 0.6 12/09/2020   Lab Results  Component Value Date   HGBA1C 6.0 (H) 12/09/2020   HGBA1C 6.3 (H) 07/24/2020   HGBA1C 6.2 (H) 04/08/2020   HGBA1C 6.1 (H) 04/30/2013   Lab Results  Component Value Date   INSULIN 12.3 07/24/2020   INSULIN 5.3 04/08/2020   Lab Results  Component Value Date   TSH 1.01 03/27/2021   Lab Results  Component Value Date   CHOL 157 12/09/2020   HDL 63 12/09/2020   LDLCALC 79 12/09/2020   TRIG 78 12/09/2020   CHOLHDL 2.8 07/26/2019   Lab Results  Component Value Date   VD25OH 72.2 12/09/2020  VD25OH 53.6 07/24/2020   VD25OH 60.0 04/08/2020   Lab Results  Component Value Date   WBC 5.3 03/27/2021   HGB 13.5 03/27/2021   HCT 40.4 03/27/2021   MCV 85.9 03/27/2021   PLT 229.0 03/27/2021   No results found for: "IRON", "TIBC", "FERRITIN"  Obesity Behavioral Intervention:   Approximately 15 minutes were spent on the discussion below.  ASK: We discussed the diagnosis of obesity with Richard [name] today and Richard [name] agreed to give [place] permission to discuss obesity behavioral modification therapy today.  ASSESS: Richard [name] has the diagnosis of obesity and his BMI today is  31.0. Richard [name] is in the action stage of change.   ADVISE: Richard [name] was educated on the multiple health risks of obesity as well as the benefit of weight loss to improve his health. He was advised of the need for long term treatment and the importance of lifestyle modifications to improve his current health and to decrease his risk of future health problems.  AGREE: Multiple dietary modification options and treatment options were discussed and Richard [name] agreed to follow the recommendations documented in the above note.  ARRANGE: Richard [name] was educated on the importance of frequent visits to treat obesity as outlined per CMS and USPSTF guidelines and agreed to schedule his next follow up appointment today.  Attestation Statements:   Reviewed by clinician on day of visit: allergies, medications, problem list, medical history, surgical history, family history, social history, and previous encounter notes.  I, Davy Pique, am acting as Location manager for Loyal Gambler, DO.  I have reviewed the above documentation for accuracy and completeness, and I agree with the above. Dell Ponto, DO

## 2021-12-15 ENCOUNTER — Other Ambulatory Visit (HOSPITAL_COMMUNITY): Payer: Self-pay

## 2021-12-15 MED ORDER — ATORVASTATIN CALCIUM 80 MG PO TABS
80.0000 mg | ORAL_TABLET | Freq: Every day | ORAL | 4 refills | Status: DC
  Filled 2021-12-15: qty 90, 90d supply, fill #0
  Filled 2022-03-16: qty 90, 90d supply, fill #1
  Filled 2022-06-09: qty 90, 90d supply, fill #2
  Filled 2022-08-30: qty 90, 90d supply, fill #3
  Filled 2022-12-06: qty 90, 90d supply, fill #4

## 2021-12-15 MED ORDER — POTASSIUM CHLORIDE ER 10 MEQ PO CPCR
10.0000 meq | ORAL_CAPSULE | Freq: Every day | ORAL | 4 refills | Status: DC
  Filled 2021-12-15: qty 90, 90d supply, fill #0
  Filled 2022-03-16: qty 90, 90d supply, fill #1
  Filled 2022-08-30: qty 90, 90d supply, fill #2
  Filled 2022-12-14: qty 90, 90d supply, fill #3

## 2021-12-29 ENCOUNTER — Other Ambulatory Visit (HOSPITAL_COMMUNITY): Payer: Self-pay

## 2022-01-01 ENCOUNTER — Other Ambulatory Visit (HOSPITAL_COMMUNITY): Payer: Self-pay

## 2022-01-01 MED ORDER — AMLODIPINE BESYLATE 5 MG PO TABS
5.0000 mg | ORAL_TABLET | Freq: Every day | ORAL | 3 refills | Status: DC
  Filled 2022-01-01: qty 90, 90d supply, fill #0
  Filled 2022-04-08: qty 90, 90d supply, fill #1
  Filled 2022-07-05: qty 90, 90d supply, fill #2
  Filled 2022-10-02: qty 90, 90d supply, fill #3

## 2022-01-05 DIAGNOSIS — H6123 Impacted cerumen, bilateral: Secondary | ICD-10-CM | POA: Insufficient documentation

## 2022-01-05 HISTORY — DX: Impacted cerumen, bilateral: H61.23

## 2022-01-06 ENCOUNTER — Ambulatory Visit (INDEPENDENT_AMBULATORY_CARE_PROVIDER_SITE_OTHER): Payer: PPO | Admitting: Family Medicine

## 2022-01-06 ENCOUNTER — Encounter (INDEPENDENT_AMBULATORY_CARE_PROVIDER_SITE_OTHER): Payer: Self-pay | Admitting: Family Medicine

## 2022-01-06 VITALS — BP 129/76 | HR 65 | Temp 97.8°F | Ht 69.0 in | Wt 207.0 lb

## 2022-01-06 DIAGNOSIS — R5383 Other fatigue: Secondary | ICD-10-CM

## 2022-01-06 DIAGNOSIS — E669 Obesity, unspecified: Secondary | ICD-10-CM | POA: Diagnosis not present

## 2022-01-06 DIAGNOSIS — Z6830 Body mass index (BMI) 30.0-30.9, adult: Secondary | ICD-10-CM

## 2022-01-06 DIAGNOSIS — G47411 Narcolepsy with cataplexy: Secondary | ICD-10-CM

## 2022-01-06 DIAGNOSIS — R0602 Shortness of breath: Secondary | ICD-10-CM

## 2022-01-06 HISTORY — DX: Other fatigue: R53.83

## 2022-01-06 HISTORY — DX: Narcolepsy with cataplexy: G47.411

## 2022-01-12 ENCOUNTER — Other Ambulatory Visit (HOSPITAL_COMMUNITY): Payer: Self-pay

## 2022-01-19 NOTE — Progress Notes (Unsigned)
Chief Complaint:   OBESITY Richard [name] is here to discuss his progress with his obesity treatment plan along with follow-up of his obesity related diagnoses. Richard [name] is on the Category 2 Plan and states he is following his eating plan approximately 70% of the time. Richard [name] states he is golfing for 2 days per week.   Today's visit was #: 24 Starting weight: 258 lbs Starting date: 04/08/2021 Today's weight: 207 lbs Today's date: 01/06/2022 Total lbs lost to date: 51 lbs Total lbs lost since last in-office visit: 2 lbs  Interim History: Has a [employer], he plans to start going.  Has been doing well with food choices and avoids meal skipping.  Gets protein with meals.  He would like to lose 10 more lbs.   Subjective:   1. SOBOE (shortness of breath on exertion) Reviewed labs and recommend 7-8 hours of sleep per night.  IC results reviewed  2. Other fatigue Reviewed labs and recommend 7-8 hours of sleep per night.  IC results reviewed  3. Transient total loss of muscle tone With 48 lbs of weight loss, he has lost 11 lbs of muscle and saw a huge drop in BMR.    Assessment/Plan:   1. SOBOE (shortness of breath on exertion) Begin resistance band training exercises at least 3 times per week and 85 grams of protein daily due to decrease in REE.   2. Other fatigue Begin resistance band training exercises at least 3 times per week and 85 grams of protein daily due to decrease in REE.  3. Transient total loss of muscle tone Begin resistance band training exercises at least 3 times per week with a trainer at the gym and log intake with a goal of 85 grams of protein daily.   4. Obesity,current BMI 30.6 Reviewed progress from January 2022.  Body fat 34.9 to 29.8%, muscle mass 160.8 to 139.8 lbs.   Richard [name] is currently in the action stage of change. As such, his goal is to continue with weight loss efforts. He has agreed to the Category 2 Plan +85 grams of protein daily.   Exercise  goals:  add weight training at the gym at least 3 times per week, see trainer for guidance.   Behavioral modification strategies: increasing lean protein intake, increasing vegetables, increasing water intake, increasing high fiber foods, no skipping meals, keeping healthy foods in the home, and planning for success.  Richard [name] has agreed to follow-up with our clinic in 6 weeks. He was informed of the importance of frequent follow-up visits to maximize his success with intensive lifestyle modifications for his multiple health conditions.   Objective:   Blood pressure 129/76, pulse 65, temperature 97.8 F (36.6 C), height '5\' 9"'$  (1.753 m), weight 207 lb (93.9 kg), SpO2 99 %. Body mass index is 30.57 kg/m.  General: Cooperative, alert, well developed, in no acute distress. HEENT: Conjunctivae and lids unremarkable. Cardiovascular: Regular rhythm.  Lungs: Normal work of breathing. Neurologic: No focal deficits.   Lab Results  Component Value Date   CREATININE 0.86 06/10/2021   BUN 12 06/10/2021   NA 130 (L) 06/10/2021   K 4.2 06/10/2021   CL 91 (L) 06/10/2021   CO2 25 06/10/2021   Lab Results  Component Value Date   ALT 13 12/09/2020   AST 22 12/09/2020   ALKPHOS 124 (H) 12/09/2020   BILITOT 0.6 12/09/2020   Lab Results  Component Value Date   HGBA1C 6.0 (H) 12/09/2020   HGBA1C 6.3 (  H) 07/24/2020   HGBA1C 6.2 (H) 04/08/2020   HGBA1C 6.1 (H) 04/30/2013   Lab Results  Component Value Date   INSULIN 12.3 07/24/2020   INSULIN 5.3 04/08/2020   Lab Results  Component Value Date   TSH 1.01 03/27/2021   Lab Results  Component Value Date   CHOL 157 12/09/2020   HDL 63 12/09/2020   LDLCALC 79 12/09/2020   TRIG 78 12/09/2020   CHOLHDL 2.8 07/26/2019   Lab Results  Component Value Date   VD25OH 72.2 12/09/2020   VD25OH 53.6 07/24/2020   VD25OH 60.0 04/08/2020   Lab Results  Component Value Date   WBC 5.3 03/27/2021   HGB 13.5 03/27/2021   HCT 40.4 03/27/2021   MCV  85.9 03/27/2021   PLT 229.0 03/27/2021   No results found for: "IRON", "TIBC", "FERRITIN"  Attestation Statements:   Reviewed by clinician on day of visit: allergies, medications, problem list, medical history, surgical history, family history, social history, and previous encounter notes.  I, Davy Pique, am acting as Location manager for Loyal Gambler, DO.  I have reviewed the above documentation for accuracy and completeness, and I agree with the above. Dell Ponto, DO

## 2022-02-05 ENCOUNTER — Other Ambulatory Visit (HOSPITAL_COMMUNITY): Payer: Self-pay

## 2022-02-05 MED ORDER — EZETIMIBE 10 MG PO TABS
10.0000 mg | ORAL_TABLET | Freq: Every day | ORAL | 4 refills | Status: DC
  Filled 2022-02-05: qty 90, 90d supply, fill #0
  Filled 2022-04-27: qty 90, 90d supply, fill #1
  Filled 2022-08-11: qty 90, 90d supply, fill #2
  Filled 2022-11-16: qty 90, 90d supply, fill #3

## 2022-02-06 ENCOUNTER — Other Ambulatory Visit (HOSPITAL_COMMUNITY): Payer: Self-pay

## 2022-02-16 ENCOUNTER — Other Ambulatory Visit (HOSPITAL_COMMUNITY): Payer: Self-pay

## 2022-02-16 ENCOUNTER — Other Ambulatory Visit: Payer: Self-pay

## 2022-02-17 ENCOUNTER — Ambulatory Visit (INDEPENDENT_AMBULATORY_CARE_PROVIDER_SITE_OTHER): Payer: PPO | Admitting: Family Medicine

## 2022-02-17 LAB — HEPATIC FUNCTION PANEL
ALT: 8 U/L — AB (ref 10–40)
AST: 16 (ref 14–40)
Alkaline Phosphatase: 107 (ref 25–125)
Bilirubin, Total: 0.5

## 2022-02-17 LAB — CBC: RBC: 4.96 (ref 3.87–5.11)

## 2022-02-17 LAB — CBC AND DIFFERENTIAL
HCT: 45 (ref 41–53)
Hemoglobin: 14.7 (ref 13.5–17.5)
Neutrophils Absolute: 2528
Platelets: 234 10*3/uL (ref 150–400)
WBC: 4.3

## 2022-02-17 LAB — TSH: TSH: 1.28 (ref 0.41–5.90)

## 2022-02-17 LAB — LAB REPORT - SCANNED: EGFR: 89

## 2022-02-17 LAB — COMPREHENSIVE METABOLIC PANEL
Albumin: 4.2 (ref 3.5–5.0)
Calcium: 9.7 (ref 8.7–10.7)
Globulin: 2.7
eGFR: 89

## 2022-02-17 LAB — BASIC METABOLIC PANEL
BUN: 18 (ref 4–21)
CO2: 25 — AB (ref 13–22)
Chloride: 97 — AB (ref 99–108)
Creatinine: 0.9 (ref 0.6–1.3)
Glucose: 94
Potassium: 4 mEq/L (ref 3.5–5.1)
Sodium: 134 — AB (ref 137–147)

## 2022-02-18 ENCOUNTER — Ambulatory Visit (INDEPENDENT_AMBULATORY_CARE_PROVIDER_SITE_OTHER): Payer: PPO | Admitting: Family Medicine

## 2022-02-18 ENCOUNTER — Encounter (INDEPENDENT_AMBULATORY_CARE_PROVIDER_SITE_OTHER): Payer: Self-pay | Admitting: Family Medicine

## 2022-02-18 ENCOUNTER — Other Ambulatory Visit (HOSPITAL_COMMUNITY): Payer: Self-pay

## 2022-02-18 VITALS — BP 145/76 | HR 74 | Temp 98.4°F | Ht 69.0 in | Wt 210.0 lb

## 2022-02-18 DIAGNOSIS — Z6831 Body mass index (BMI) 31.0-31.9, adult: Secondary | ICD-10-CM

## 2022-02-18 DIAGNOSIS — R7303 Prediabetes: Secondary | ICD-10-CM | POA: Diagnosis not present

## 2022-02-18 DIAGNOSIS — E66812 Obesity, class 2: Secondary | ICD-10-CM

## 2022-02-18 DIAGNOSIS — E669 Obesity, unspecified: Secondary | ICD-10-CM | POA: Diagnosis not present

## 2022-02-19 ENCOUNTER — Encounter (HOSPITAL_COMMUNITY): Payer: PPO

## 2022-02-20 ENCOUNTER — Other Ambulatory Visit: Payer: Self-pay | Admitting: *Deleted

## 2022-02-24 ENCOUNTER — Other Ambulatory Visit (HOSPITAL_COMMUNITY): Payer: Self-pay | Admitting: Pulmonary Disease

## 2022-02-24 ENCOUNTER — Ambulatory Visit (HOSPITAL_COMMUNITY)
Admission: RE | Admit: 2022-02-24 | Discharge: 2022-02-24 | Disposition: A | Discharge status: Home / Self Care | Payer: PPO | Source: Ambulatory Visit | Attending: Vascular Surgery | Admitting: Vascular Surgery

## 2022-02-24 DIAGNOSIS — R42 Dizziness and giddiness: Secondary | ICD-10-CM

## 2022-02-26 NOTE — Progress Notes (Signed)
Chief Complaint:   OBESITY Richard [name] is here to discuss his progress with his obesity treatment plan along with follow-up of his obesity related diagnoses. Richard [name] is on the Category 2 Plan +85 protein and states he is following his eating plan approximately 65% of the time. Richard [name] states he is exercising.  Today's visit was #: 25 Starting weight: 12 LBS Starting date: 04/08/2021 Today's weight: 210 LBS Today's date: 02/18/2022 Total lbs lost to date: 70 LBS Total lbs lost since last in-office visit: +3 LBS  Interim History: Patient admits to getting off track with travel to [place] for 2 weeks over  the holidays.  He has not been as active but joined a gym.  He plans to go 3 times a week.  Getting in protein and water daily.  Has  sugar cravings.  Subjective:   1. Prediabetes Last A1c was 6.0 on 12/09/2020.  Actively working on diet, exercise, and weight loss.  Assessment/Plan:   1. Prediabetes Recheck A1c at next visit.  2. Obesity,current BMI 31.1 +0.8 lb muscle mass reviewed on bioimpedance.   Overall progress:  18.6 % TBW loss in 23 months.   Richard [name] is currently in the action stage of change. As such, his goal is to continue with weight loss efforts. He has agreed to the Category 2 Plan +85 protein daily.  Exercise goals:  Go to the gym 3 times per week.  Behavioral modification strategies: increasing lean protein intake, increasing vegetables, increasing water intake, keeping healthy foods in the home, holiday eating strategies , celebration eating strategies, and decreasing junk food.  Richard [name] has agreed to follow-up with our clinic in 6-8 weeks. He was informed of the importance of frequent follow-up visits to maximize his success with intensive lifestyle modifications for his multiple health conditions.   Objective:   Blood pressure (!) 145/76, pulse 74, temperature 98.4 F (36.9 C), height '5\' 9"'$  (1.753 m), weight 210 lb (95.3 kg), SpO2 98 %. Body mass index is 31.01  kg/m.  General: Cooperative, alert, well developed, in no acute distress. HEENT: Conjunctivae and lids unremarkable. Cardiovascular: Regular rhythm.  Lungs: Normal work of breathing. Neurologic: No focal deficits.   Lab Results  Component Value Date   CREATININE 0.86 06/10/2021   BUN 12 06/10/2021   NA 130 (L) 06/10/2021   K 4.2 06/10/2021   CL 91 (L) 06/10/2021   CO2 25 06/10/2021   Lab Results  Component Value Date   ALT 13 12/09/2020   AST 22 12/09/2020   ALKPHOS 124 (H) 12/09/2020   BILITOT 0.6 12/09/2020   Lab Results  Component Value Date   HGBA1C 6.0 (H) 12/09/2020   HGBA1C 6.3 (H) 07/24/2020   HGBA1C 6.2 (H) 04/08/2020   HGBA1C 6.1 (H) 04/30/2013   Lab Results  Component Value Date   INSULIN 12.3 07/24/2020   INSULIN 5.3 04/08/2020   Lab Results  Component Value Date   TSH 1.01 03/27/2021   Lab Results  Component Value Date   CHOL 157 12/09/2020   HDL 63 12/09/2020   LDLCALC 79 12/09/2020   TRIG 78 12/09/2020   CHOLHDL 2.8 07/26/2019   Lab Results  Component Value Date   VD25OH 72.2 12/09/2020   VD25OH 53.6 07/24/2020   VD25OH 60.0 04/08/2020   Lab Results  Component Value Date   WBC 5.3 03/27/2021   HGB 13.5 03/27/2021   HCT 40.4 03/27/2021   MCV 85.9 03/27/2021   PLT 229.0 03/27/2021   No results found for: "  IRON", "TIBC", "FERRITIN"  Attestation Statements:   Reviewed by clinician on day of visit: allergies, medications, problem list, medical history, surgical history, family history, social history, and previous encounter notes.  I, Davy Pique, am acting as Location manager for Loyal Gambler, DO.  I have reviewed the above documentation for accuracy and completeness, and I agree with the above. Dell Ponto, DO

## 2022-03-04 ENCOUNTER — Other Ambulatory Visit (HOSPITAL_COMMUNITY): Payer: Self-pay

## 2022-03-12 ENCOUNTER — Ambulatory Visit
Admission: EM | Admit: 2022-03-12 | Discharge: 2022-03-12 | Disposition: A | Discharge status: Home / Self Care | Payer: PPO | Attending: Internal Medicine | Admitting: Internal Medicine

## 2022-03-12 ENCOUNTER — Other Ambulatory Visit (HOSPITAL_COMMUNITY): Payer: Self-pay

## 2022-03-12 DIAGNOSIS — J441 Chronic obstructive pulmonary disease with (acute) exacerbation: Secondary | ICD-10-CM | POA: Diagnosis not present

## 2022-03-12 DIAGNOSIS — J069 Acute upper respiratory infection, unspecified: Secondary | ICD-10-CM

## 2022-03-12 DIAGNOSIS — R062 Wheezing: Secondary | ICD-10-CM | POA: Diagnosis not present

## 2022-03-12 MED ORDER — PREDNISONE 20 MG PO TABS: 40.0000 mg | ORAL_TABLET | Freq: Every day | ORAL | 0 refills | Status: AC

## 2022-03-12 MED ORDER — BENZONATATE 100 MG PO CAPS: 100.0000 mg | ORAL_CAPSULE | Freq: Three times a day (TID) | ORAL | 0 refills | Status: DC | PRN

## 2022-03-12 MED ORDER — PREDNISONE 20 MG PO TABS
40.0000 mg | ORAL_TABLET | Freq: Every day | ORAL | 0 refills | Status: DC
  Filled 2022-03-12: qty 10, 5d supply, fill #0

## 2022-03-12 MED ORDER — ALBUTEROL SULFATE (2.5 MG/3ML) 0.083% IN NEBU
2.5000 mg | INHALATION_SOLUTION | Freq: Once | RESPIRATORY_TRACT | Status: AC
  Administered 2022-03-12: 2.5 mg via RESPIRATORY_TRACT

## 2022-03-12 MED ORDER — BENZONATATE 100 MG PO CAPS
100.0000 mg | ORAL_CAPSULE | Freq: Three times a day (TID) | ORAL | 0 refills | Status: DC | PRN
  Filled 2022-03-12: qty 21, 7d supply, fill #0

## 2022-03-12 NOTE — ED Provider Notes (Addendum)
[facility]    CSN: [id number] Arrival date & time: 03/12/22  9242      History   Chief Complaint Chief Complaint  Patient presents with   Cough    HPI Richard [name] is a 77 y.o. male.   Patient presents with 6-day history of cough and nasal congestion.  Denies any known sick contacts or fever at home.  Patient reports that he had some increase in shortness of breath since symptoms started.  He reports history of asthma and COPD.  He uses twice daily nebulizers and Breo inhaler.  He also has albuterol inhaler to use as needed but has not used this.  Reports current shortness of breath and shortness of breath has been intermittent.  Denies chest pain, sore throat, ear pain, nausea, vomiting, diarrhea, abdominal pain.   Cough   Past Medical History:  Diagnosis Date   Acid reflux disease    Arthritis    Asthma 04/30/2013   Atherosclerosis of coronary artery 04/30/2013   Ventricular fibrillation; circumflex stent x2 in 2004   Benign essential hypertension    CAP (community acquired pneumonia) 04/30/2013   Class 2 severe obesity with serious comorbidity and body mass index (BMI) of 39.0 to 39.9 in adult 06/04/2020   COPD, 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   Dyspnea on exertion 09/13/2017   Edema, lower extremity    Heart failure, diastolic    Hemorrhage of rectum and anus    Hyperlipidemia 06/26/2013   Hypoxia 04/30/2013   Influenza due to identified novel influenza A virus with other respiratory manifestations 05/01/2013   Internal hemorrhoids    Mild cognitive impairment of uncertain or unknown etiology 09/30/2021   Myocardial infarction 2004   Dr. Tamala Julian, at [place], yearly visits   OSA treated with BiPAP 04/24/2018   Mild OSA with an AHI of 13.5/hr and O2 sats of 88%. Now on BiPAP at 13/9cm H2O.    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   Prediabetes 04/09/2020   Pure hypercholesterolemia    Right bundle branch block 06/27/2014     EKG performed 06/27/2014   Vitamin D deficiency     Patient Active Problem List   Diagnosis Date Noted   SOBOE (shortness of breath on exertion) 01/06/2022   Other fatigue 01/06/2022   Transient total loss of muscle tone 01/06/2022   Essential hypertension 12/02/2021   Pre-diabetes 12/02/2021   Mild cognitive impairment of uncertain or unknown etiology 09/30/2021   Acid reflux disease 09/29/2021   Heart failure, diastolic 68/34/1962   Benign essential hypertension    Internal hemorrhoids    Personal history of colonic polyps    Pure hypercholesterolemia    Class 2 severe obesity with serious comorbidity and body mass index (BMI) of 39.0 to 39.9 in adult 06/04/2020   COPD, 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   Prediabetes 04/09/2020   OSA treated with BiPAP 04/24/2018   Dyspnea on exertion 09/13/2017   Right bundle branch block 06/27/2014   Hyperlipidemia 06/26/2013   Influenza due to identified novel influenza A virus with other respiratory manifestations 05/01/2013   CAP (community acquired pneumonia) 04/30/2013   Atherosclerosis of coronary artery 04/30/2013   Asthma 04/30/2013   Hypoxia 04/30/2013   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    Class: Chronic   Myocardial infarction 2004    Past Surgical History:  Procedure Laterality Date   CARDIAC CATHETERIZATION  2004   with 2 stents   CARDIOVASCULAR STRESS TEST  2011   COLONOSCOPY  POLYPECTOMY  02/17/2011   Procedure: POLYPECTOMY NASAL;  Surgeon: Delsa Bern;  Location: [facility];  Service: ENT;  Laterality: N/A;   SINUS ENDO W/FUSION  02/17/2011   Procedure: ENDOSCOPIC SINUS SURGERY WITH FUSION NAVIGATION;  Surgeon: Delsa Bern;  Location: [facility];  Service: ENT;  Laterality: N/A;   TONSILLECTOMY     US ECHOCARDIOGRAPHY         Home Medications    Prior to Admission medications   Medication Sig Start Date End Date Taking? Authorizing Provider  amLODipine (NORVASC) 5 MG tablet Take 1 tablet (5 mg total) by mouth  daily. 01/01/22     aspirin EC 81 MG tablet Take 1 tablet (81 mg total) by mouth daily. 04/22/17   Daune Perch, NP  atorvastatin (LIPITOR) 80 MG tablet Take 1 tablet (80 mg total) by mouth at bedtime. 12/10/20     atorvastatin (LIPITOR) 80 MG tablet Take 1 tablet (80 mg total) by mouth at bedtime. 12/15/21     benzonatate (TESSALON) 100 MG capsule Take 1 capsule (100 mg total) by mouth every 8 (eight) hours as needed for cough. 03/12/22   Teodora Medici, FNP  carvedilol (COREG) 12.5 MG tablet Take 1 tablet (12.5 mg total) by mouth 2 (two) times daily. 09/08/21   Belva Crome, MD  chlorthalidone (HYGROTON) 25 MG tablet Take 0.5 tablets (12.5 mg total) by mouth daily. 09/26/21   Belva Crome, MD  COVID-19 mRNA Vac-TriS, Pfizer, (PFIZER-BIONT COVID-19 VAC-TRIS) SUSP injection Inject into the muscle. 09/24/20   Carlyle Basques, MD  dapagliflozin propanediol (FARXIGA) 10 MG TABS tablet Take 1 tablet (10 mg total) by mouth daily before breakfast. 03/14/21   Belva Crome, MD  dapagliflozin propanediol (FARXIGA) 10 MG TABS tablet 1 tablet in the morning    [provider]  ergocalciferol (VITAMIN D2) 1.25 MG (50000 UT) capsule Take 1 capsule (50,000 Units total) by mouth once a week. 11/17/21     ezetimibe (ZETIA) 10 MG tablet Take 1 tablet (10 mg total) by mouth daily. 01/16/21     ezetimibe (ZETIA) 10 MG tablet Take 1 tablet (10 mg total) by mouth daily. 02/05/22     fluticasone furoate-vilanterol (BREO ELLIPTA) 200-25 MCG/ACT AEPB Inhale 1 puff into the lungs daily. 07/30/20   Vincente Liberty, MD  fluticasone furoate-vilanterol (BREO ELLIPTA) 200-25 MCG/ACT AEPB Inhale 1 puff into the lungs daily. 09/29/21     fluticasone furoate-vilanterol (BREO ELLIPTA) 200-25 MCG/INH AEPB Inhale 1 puff into the lungs daily. 07/30/20     guaiFENesin (MUCINEX) 600 MG 12 hr tablet Take by mouth 2 (two) times daily.    [provider]  ipratropium-albuterol (DUONEB) 0.5-2.5 (3) MG/3ML SOLN USE 1 VIAL VIA  NEBULIZER 2 TIMES DAILY & AT BEDTIME IF NEEDED 03/28/20 03/28/21  Vincente Liberty, MD  ipratropium-albuterol (DUONEB) 0.5-2.5 (3) MG/3ML SOLN Take 3 mLs by nebulization 2 times daily and if needed once at bedtime 02/11/21     montelukast (SINGULAIR) 10 MG tablet Take 1 tablet (10 mg total) by mouth daily. 09/29/21     nitroGLYCERIN (NITROSTAT) 0.4 MG SL tablet Place 1 tablet (0.4 mg total) under the tongue every 5 (five) minutes as needed. For chest pain 03/14/21   Belva Crome, MD  omeprazole (PRILOSEC) 40 MG capsule TAKE 1 CAPSULE BY MOUTH TWICE DAILY. 01/09/20 01/08/21  Vincente Liberty, MD  omeprazole (PRILOSEC) 40 MG capsule Take 1 capsule (40 mg total) by mouth 2 (two) times daily. 01/16/21  polyethylene glycol powder (GLYCOLAX/MIRALAX) 17 GM/SCOOP powder See admin instructions.    [provider]  polyethylene glycol-electrolytes (NULYTELY) 420 g solution Use as directed 03/21/21     potassium chloride (MICRO-K) 10 MEQ CR capsule Take 1 capsule (10 mEq total) by mouth daily. 12/15/21     predniSONE (DELTASONE) 20 MG tablet Take 2 tablets (40 mg total) by mouth daily for 5 days. 03/12/22 03/17/22  Teodora Medici, FNP  tamsulosin (FLOMAX) 0.4 MG CAPS capsule Take 1 capsule (0.4 mg total) by mouth daily. 02/11/21       Family History Family History  Problem Relation Age of Onset   Cancer Mother    Alzheimer's disease Mother    Stroke Mother    Heart attack Father    Diabetes Father    Hypertension Father    Hypertension Sister    Diabetes Sister    Alzheimer's disease Maternal Aunt        total of 4 maternal aunts with AD   Alzheimer's disease Cousin    Alzheimer's disease Maternal Uncle     Social History Social History   Tobacco Use   Smoking status: Former   Smokeless tobacco: Never  Scientific laboratory technician Use: Never used  Substance Use Topics   Alcohol use: Not Currently    Comment: infrequent   Drug use: No     Allergies   Beef-derived products, Chicken  protein, Lisinopril, and Peanut-containing drug products   Review of Systems Review of Systems Per HPI  Physical Exam Triage Vital Signs ED Triage Vitals  Enc Vitals Group     BP 03/12/22 0837 134/74     Pulse Rate 03/12/22 0837 90     Resp 03/12/22 0837 18     Temp 03/12/22 0837 98.5 F (36.9 C)     Temp Source 03/12/22 0837 Oral     SpO2 03/12/22 0837 93 %     Weight --      Height --      Head Circumference --      Peak Flow --      Pain Score 03/12/22 0838 0     Pain Loc --      Pain Edu? --      Excl. in [place]? --    No data found.  Updated Vital Signs BP 134/74 (BP Location: Right Arm)   Pulse 90   Temp 98.5 F (36.9 C) (Oral)   Resp 18   SpO2 98%   Visual Acuity Right Eye Distance:   Left Eye Distance:   Bilateral Distance:    Right Eye Near:   Left Eye Near:    Bilateral Near:     Physical Exam Constitutional:      General: He is not in acute distress.    Appearance: Normal appearance. He is not toxic-appearing or diaphoretic.  HENT:     Head: Normocephalic and atraumatic.     Right Ear: Tympanic membrane and ear canal normal.     Left Ear: Tympanic membrane and ear canal normal.     Nose: Congestion present.     Mouth/Throat:     Mouth: Mucous membranes are moist.     Pharynx: No posterior oropharyngeal erythema.  Eyes:     Extraocular Movements: Extraocular movements intact.     Conjunctiva/sclera: Conjunctivae normal.     Pupils: Pupils are equal, round, and reactive to light.  Cardiovascular:     Rate and Rhythm: Normal rate and regular rhythm.  Pulses: Normal pulses.     Heart sounds: Normal heart sounds.  Pulmonary:     Effort: Pulmonary effort is normal. No respiratory distress.     Breath sounds: No stridor. Wheezing present. No rhonchi or rales.     Comments: Wheezing noted bilaterally. Abdominal:     General: Abdomen is flat. Bowel sounds are normal.     Palpations: Abdomen is soft.  Musculoskeletal:        General: Normal  range of motion.     Cervical back: Normal range of motion.  Skin:    General: Skin is warm and dry.  Neurological:     General: No focal deficit present.     Mental Status: He is alert and oriented to person, place, and time. Mental status is at baseline.  Psychiatric:        Mood and Affect: Mood normal.        Behavior: Behavior normal.      UC Treatments / Results  Labs (all labs ordered are listed, but only abnormal results are displayed) Labs Reviewed - No data to display  EKG   Radiology No results found.  Procedures Procedures (including critical care time)  Medications Ordered in UC Medications  albuterol (PROVENTIL) (2.5 MG/3ML) 0.083% nebulizer solution 2.5 mg (2.5 mg Nebulization Given 03/12/22 0917)    Initial Impression / Assessment and Plan / UC Course  I have reviewed the triage vital signs and the nursing notes.  Pertinent labs & imaging results that were available during my care of the patient were reviewed by me and considered in my medical decision making (see chart for details).     Patient presents with symptoms likely from a viral upper respiratory infection. Differential includes bacterial pneumonia, sinusitis, allergic rhinitis, COVID-19, flu, RSV.  Suspect asthma/COPD exacerbation given wheezing noted on exam and persistent symptoms.  Patient is nontoxic appearing and not in need of emergent medical intervention.  Patient declined COVID testing with shared decision making given duration of symptoms as it would not change treatment.  Oxygen was ranging from 90 to 93% on initial triage and physical exam.  Albuterol nebulizer treatment administered in urgent care with improvement in lung sounds and oxygen increasing to 98% and sustaining.  Therefore, patient is stable for discharge.  Will treat with prednisone steroid burst to help alleviate COPD exacerbation and wheezing given nebulizer treatments have been minimally helpful.  Diastolic heart failure is  listed in patient's chart but no recent echo or EF noted.  Patient also takes chlorthalidone which can increase the diuretic effect when taken with prednisone.  Despite these factors, I do think that benefits outweigh risks given patient's oxygen was mildly low on arrival, patient has wheezing, and COPD exacerbation.  Patient was advised to follow-up with PCP to have electrolytes rechecked given prednisone and chlorthalidone taken together.  He voiced understanding.  Also advised patient to get a pulse oximeter and check oxygen diligently at home over the next few days.  Blood glucose appears to be well controlled so prednisone should be safe with this as well. He was given strict ER precautions.  Patient states understanding and is agreeable.  Discharged with PCP followup.  Final Clinical Impressions(s) / UC Diagnoses   Final diagnoses:  Viral upper respiratory tract infection with cough  Wheezing  COPD exacerbation (HCC)     Discharge Instructions      It appears that you have a viral illness that is causing exacerbation of your COPD and asthma.  I have prescribed you prednisone to decrease inflammation and help alleviate symptoms.  Cough medication has also been prescribed.  I recommend that you get a pulse oximeter from the pharmacy and monitor oxygen very closely at home.  Please go to the hospital if symptoms persist or worsen or if your oxygen is consistently below 93%.  Please go straight to the hospital if shortness of breath worsens as well.     ED Prescriptions     Medication Sig Dispense Auth. Provider   predniSONE (DELTASONE) 20 MG tablet  (Status: Discontinued) Take 2 tablets (40 mg total) by mouth daily for 5 days. 10 tablet [place], [place] E, [place]   benzonatate (TESSALON) 100 MG capsule  (Status: Discontinued) Take 1 capsule (100 mg total) by mouth every 8 (eight) hours as needed for cough. 21 capsule [place], [place] E, [place]   benzonatate (TESSALON) 100 MG capsule Take 1 capsule  (100 mg total) by mouth every 8 (eight) hours as needed for cough. 21 capsule [place], [place] E, [place]   predniSONE (DELTASONE) 20 MG tablet Take 2 tablets (40 mg total) by mouth daily for 5 days. 10 tablet Teodora Medici, [place]      PDMP not reviewed this encounter.   Teodora Medici, [place] 03/12/22 1012    [address],  03/12/22 1012

## 2022-03-12 NOTE — ED Triage Notes (Signed)
Pt c/o cough, nasal congestion   Onset ~ Saturday   Denies pain in triage

## 2022-03-12 NOTE — Discharge Instructions (Addendum)
It appears that you have a viral illness that is causing exacerbation of your COPD and asthma.  I have prescribed you prednisone to decrease inflammation and help alleviate symptoms.  Cough medication has also been prescribed.  I recommend that you get a pulse oximeter from the pharmacy and monitor oxygen very closely at home.  Please go to the hospital if symptoms persist or worsen or if your oxygen is consistently below 93%.  Please go straight to the hospital if shortness of breath worsens as well.

## 2022-03-13 ENCOUNTER — Other Ambulatory Visit: Payer: Self-pay

## 2022-03-13 ENCOUNTER — Other Ambulatory Visit (HOSPITAL_COMMUNITY): Payer: Self-pay

## 2022-03-13 ENCOUNTER — Emergency Department (HOSPITAL_COMMUNITY)
Admission: EM | Admit: 2022-03-13 | Discharge: 2022-03-13 | Disposition: A | Discharge status: Home / Self Care | Payer: PPO | Attending: Emergency Medicine | Admitting: Emergency Medicine

## 2022-03-13 ENCOUNTER — Emergency Department (HOSPITAL_COMMUNITY): Payer: PPO

## 2022-03-13 DIAGNOSIS — Z1152 Encounter for screening for COVID-19: Secondary | ICD-10-CM | POA: Insufficient documentation

## 2022-03-13 DIAGNOSIS — I110 Hypertensive heart disease with heart failure: Secondary | ICD-10-CM | POA: Insufficient documentation

## 2022-03-13 DIAGNOSIS — R0602 Shortness of breath: Secondary | ICD-10-CM | POA: Diagnosis not present

## 2022-03-13 DIAGNOSIS — Z7951 Long term (current) use of inhaled steroids: Secondary | ICD-10-CM | POA: Insufficient documentation

## 2022-03-13 DIAGNOSIS — R509 Fever, unspecified: Secondary | ICD-10-CM | POA: Diagnosis not present

## 2022-03-13 DIAGNOSIS — J45909 Unspecified asthma, uncomplicated: Secondary | ICD-10-CM | POA: Diagnosis not present

## 2022-03-13 DIAGNOSIS — Z8679 Personal history of other diseases of the circulatory system: Secondary | ICD-10-CM | POA: Diagnosis not present

## 2022-03-13 DIAGNOSIS — Z7982 Long term (current) use of aspirin: Secondary | ICD-10-CM | POA: Diagnosis not present

## 2022-03-13 DIAGNOSIS — Z91010 Allergy to peanuts: Secondary | ICD-10-CM | POA: Insufficient documentation

## 2022-03-13 DIAGNOSIS — Z87891 Personal history of nicotine dependence: Secondary | ICD-10-CM | POA: Diagnosis not present

## 2022-03-13 DIAGNOSIS — J441 Chronic obstructive pulmonary disease with (acute) exacerbation: Secondary | ICD-10-CM | POA: Diagnosis not present

## 2022-03-13 DIAGNOSIS — J189 Pneumonia, unspecified organism: Secondary | ICD-10-CM | POA: Diagnosis not present

## 2022-03-13 DIAGNOSIS — Z79899 Other long term (current) drug therapy: Secondary | ICD-10-CM | POA: Insufficient documentation

## 2022-03-13 DIAGNOSIS — I5030 Unspecified diastolic (congestive) heart failure: Secondary | ICD-10-CM | POA: Insufficient documentation

## 2022-03-13 DIAGNOSIS — R059 Cough, unspecified: Secondary | ICD-10-CM | POA: Diagnosis not present

## 2022-03-13 DIAGNOSIS — J181 Lobar pneumonia, unspecified organism: Secondary | ICD-10-CM | POA: Diagnosis not present

## 2022-03-13 LAB — BASIC METABOLIC PANEL
Anion gap: 11 (ref 5–15)
BUN: 22 mg/dL (ref 8–23)
CO2: 24 mmol/L (ref 22–32)
Calcium: 8.9 mg/dL (ref 8.9–10.3)
Chloride: 93 mmol/L — ABNORMAL LOW (ref 98–111)
Creatinine, Ser: 1 mg/dL (ref 0.61–1.24)
GFR, Estimated: >60 mL/min (ref >60)
Glucose, Bld: 123 mg/dL — ABNORMAL HIGH (ref 70–99)
Potassium: 3.5 mmol/L (ref 3.5–5.1)
Sodium: 128 mmol/L — ABNORMAL LOW (ref 135–145)

## 2022-03-13 LAB — CBC
HCT: 40.4 % (ref 39.0–52.0)
Hemoglobin: 13.9 g/dL (ref 13.0–17.0)
MCH: 29.4 pg (ref 26.0–34.0)
MCHC: 34.4 g/dL (ref 30.0–36.0)
MCV: 85.4 fL (ref 80.0–100.0)
Platelets: 230 10*3/uL (ref 150–400)
RBC: 4.73 MIL/uL (ref 4.22–5.81)
RDW: 15.1 % (ref 11.5–15.5)
WBC: 9.2 10*3/uL (ref 4.0–10.5)
nRBC: 0 % (ref 0.0–0.2)

## 2022-03-13 LAB — RESP PANEL BY RT-PCR (RSV, FLU A&B, COVID)  RVPGX2
Influenza A by PCR: NEGATIVE
Influenza B by PCR: NEGATIVE
Resp Syncytial Virus by PCR: NEGATIVE
SARS Coronavirus 2 by RT PCR: NEGATIVE

## 2022-03-13 MED ORDER — AZITHROMYCIN 250 MG PO TABS
ORAL_TABLET | ORAL | 0 refills | Status: AC
  Filled 2022-03-13: qty 6, 5d supply, fill #0

## 2022-03-13 MED ORDER — ACETAMINOPHEN 500 MG PO TABS
1000.0000 mg | ORAL_TABLET | Freq: Once | ORAL | Status: DC
  Filled 2022-03-13: qty 2

## 2022-03-13 MED ORDER — AMOXICILLIN-POT CLAVULANATE 875-125 MG PO TABS
1.0000 | ORAL_TABLET | Freq: Two times a day (BID) | ORAL | 0 refills | Status: AC
  Filled 2022-03-13: qty 14, 7d supply, fill #0

## 2022-03-13 NOTE — ED Notes (Signed)
Patient ambulated up and down hall way. Patient pulse 90 and pulse ox 94% RA

## 2022-03-13 NOTE — ED Notes (Signed)
Patient ambulatory back to stretcher

## 2022-03-13 NOTE — ED Provider Notes (Signed)
[facility] EMERGENCY DEPARTMENT Provider Note  CSN: [id number] Arrival date & time: 03/13/22 4132  Chief Complaint(s) Shortness of Breath, Cough, and Nasal Congestion  HPI Richard [name] is a 77 y.o. male with history of COPD, CHF, coronary artery disease, sleep apnea presenting to the emergency department with shortness of breath.  He reports since Saturday, he has had a cough, congestion.  Reports increased shortness of breath and the cough is productive of green phlegm.  He has been compliant with his home nebulizer treatments and home inhalers.  He has been compliant with his other medications.  He denies any nausea, vomiting, chest pain, abdominal pain.  Denies any leg swelling.  He reports he feels okay at rest.  He went to an urgent care yesterday, they treated him for COPD exacerbation with improvement and prescribed steroids.  He reports that he was told to return to the emergency department if his oxygen saturation dipped, he brought a finger pulse ox and it apparently sat 83 at home.  He currently feels okay and not short of breath.   Past Medical History Past Medical History:  Diagnosis Date   Acid reflux disease    Arthritis    Asthma 04/30/2013   Atherosclerosis of coronary artery 04/30/2013   Ventricular fibrillation; circumflex stent x2 in 2004   Benign essential hypertension    CAP (community acquired pneumonia) 04/30/2013   Class 2 severe obesity with serious comorbidity and body mass index (BMI) of 39.0 to 39.9 in adult 06/04/2020   COPD, 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   Dyspnea on exertion 09/13/2017   Edema, lower extremity    Heart failure, diastolic    Hemorrhage of rectum and anus    Hyperlipidemia 06/26/2013   Hypoxia 04/30/2013   Influenza due to identified novel influenza A virus with other respiratory manifestations 05/01/2013   Internal hemorrhoids    Mild cognitive impairment of uncertain or unknown etiology 09/30/2021   Myocardial infarction  2004   Dr. Tamala Julian, at [facility], yearly visits   OSA treated with BiPAP 04/24/2018   Mild OSA with an AHI of 13.5/hr and O2 sats of 88%. Now on BiPAP at 13/9cm H2O.    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   Prediabetes 04/09/2020   Pure hypercholesterolemia    Right bundle branch block 06/27/2014    EKG performed 06/27/2014   Vitamin D deficiency    Patient Active Problem List   Diagnosis Date Noted   SOBOE (shortness of breath on exertion) 01/06/2022   Other fatigue 01/06/2022   Transient total loss of muscle tone 01/06/2022   Essential hypertension 12/02/2021   Pre-diabetes 12/02/2021   Mild cognitive impairment of uncertain or unknown etiology 09/30/2021   Acid reflux disease 09/29/2021   Heart failure, diastolic 44/03/270   Benign essential hypertension    Internal hemorrhoids    Personal history of colonic polyps    Pure hypercholesterolemia    Class 2 severe obesity with serious comorbidity and body mass index (BMI) of 39.0 to 39.9 in adult 06/04/2020   COPD, 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   Prediabetes 04/09/2020   OSA treated with BiPAP 04/24/2018   Dyspnea on exertion 09/13/2017   Right bundle branch block 06/27/2014   Hyperlipidemia 06/26/2013   Influenza due to identified novel influenza A virus with other respiratory manifestations 05/01/2013   CAP (community acquired pneumonia) 04/30/2013   Atherosclerosis of coronary artery 04/30/2013   Asthma 04/30/2013   Hypoxia 04/30/2013   Polypoid sinus  degeneration 02/17/2011    Class: Chronic   Myocardial infarction 2004   Home Medication(s) Prior to Admission medications   Medication Sig Start Date End Date Taking? Authorizing Provider  amoxicillin-clavulanate (AUGMENTIN) 875-125 MG tablet Take 1 tablet by mouth every 12 (twelve) hours for 7 days. 03/13/22 03/20/22 Yes Cristie Hem, MD  azithromycin (ZITHROMAX) 250 MG tablet Take 1 tablet (250 mg total) by mouth daily. Take first 2  tablets together, then 1 every day until finished. 03/13/22  Yes Cristie Hem, MD  amLODipine (NORVASC) 5 MG tablet Take 1 tablet (5 mg total) by mouth daily. 01/01/22     aspirin EC 81 MG tablet Take 1 tablet (81 mg total) by mouth daily. 04/22/17   Daune Perch, NP  atorvastatin (LIPITOR) 80 MG tablet Take 1 tablet (80 mg total) by mouth at bedtime. 12/10/20     atorvastatin (LIPITOR) 80 MG tablet Take 1 tablet (80 mg total) by mouth at bedtime. 12/15/21     benzonatate (TESSALON) 100 MG capsule Take 1 capsule (100 mg total) by mouth every 8 (eight) hours as needed for cough. 03/12/22   Teodora Medici, FNP  carvedilol (COREG) 12.5 MG tablet Take 1 tablet (12.5 mg total) by mouth 2 (two) times daily. 09/08/21   Belva Crome, MD  chlorthalidone (HYGROTON) 25 MG tablet Take 0.5 tablets (12.5 mg total) by mouth daily. 09/26/21   Belva Crome, MD  COVID-19 mRNA Vac-TriS, Pfizer, (PFIZER-BIONT COVID-19 VAC-TRIS) SUSP injection Inject into the muscle. 09/24/20   Carlyle Basques, MD  dapagliflozin propanediol (FARXIGA) 10 MG TABS tablet Take 1 tablet (10 mg total) by mouth daily before breakfast. 03/14/21   Belva Crome, MD  dapagliflozin propanediol (FARXIGA) 10 MG TABS tablet 1 tablet in the morning    [provider]  ergocalciferol (VITAMIN D2) 1.25 MG (50000 UT) capsule Take 1 capsule (50,000 Units total) by mouth once a week. 11/17/21     ezetimibe (ZETIA) 10 MG tablet Take 1 tablet (10 mg total) by mouth daily. 01/16/21     ezetimibe (ZETIA) 10 MG tablet Take 1 tablet (10 mg total) by mouth daily. 02/05/22     fluticasone furoate-vilanterol (BREO ELLIPTA) 200-25 MCG/ACT AEPB Inhale 1 puff into the lungs daily. 07/30/20   Vincente Liberty, MD  fluticasone furoate-vilanterol (BREO ELLIPTA) 200-25 MCG/ACT AEPB Inhale 1 puff into the lungs daily. 09/29/21     fluticasone furoate-vilanterol (BREO ELLIPTA) 200-25 MCG/INH AEPB Inhale 1 puff into the lungs daily. 07/30/20     guaiFENesin  (MUCINEX) 600 MG 12 hr tablet Take by mouth 2 (two) times daily.    [provider]  ipratropium-albuterol (DUONEB) 0.5-2.5 (3) MG/3ML SOLN USE 1 VIAL VIA NEBULIZER 2 TIMES DAILY & AT BEDTIME IF NEEDED 03/28/20 03/28/21  Vincente Liberty, MD  ipratropium-albuterol (DUONEB) 0.5-2.5 (3) MG/3ML SOLN Take 3 mLs by nebulization 2 times daily and if needed once at bedtime 02/11/21     montelukast (SINGULAIR) 10 MG tablet Take 1 tablet (10 mg total) by mouth daily. 09/29/21     nitroGLYCERIN (NITROSTAT) 0.4 MG SL tablet Place 1 tablet (0.4 mg total) under the tongue every 5 (five) minutes as needed. For chest pain 03/14/21   Belva Crome, MD  omeprazole (PRILOSEC) 40 MG capsule TAKE 1 CAPSULE BY MOUTH TWICE DAILY. 01/09/20 01/08/21  Vincente Liberty, MD  omeprazole (PRILOSEC) 40 MG capsule Take 1 capsule (40 mg total) by mouth 2 (two) times daily. 01/16/21     polyethylene glycol  powder (GLYCOLAX/MIRALAX) 17 GM/SCOOP powder See admin instructions.    [provider]  polyethylene glycol-electrolytes (NULYTELY) 420 g solution Use as directed 03/21/21     potassium chloride (MICRO-K) 10 MEQ CR capsule Take 1 capsule (10 mEq total) by mouth daily. 12/15/21     predniSONE (DELTASONE) 20 MG tablet Take 2 tablets (40 mg total) by mouth daily for 5 days. 03/12/22 03/17/22  Teodora Medici, FNP  tamsulosin (FLOMAX) 0.4 MG CAPS capsule Take 1 capsule (0.4 mg total) by mouth daily. 02/11/21                                                                                                                                       Past Surgical History Past Surgical History:  Procedure Laterality Date   CARDIAC CATHETERIZATION  2004   with 2 stents   CARDIOVASCULAR STRESS TEST  2011   COLONOSCOPY     POLYPECTOMY  02/17/2011   Procedure: POLYPECTOMY NASAL;  Surgeon: Delsa Bern;  Location: [facility];  Service: ENT;  Laterality: N/A;   SINUS ENDO W/FUSION  02/17/2011   Procedure: ENDOSCOPIC SINUS SURGERY WITH  FUSION NAVIGATION;  Surgeon: Delsa Bern;  Location: [facility];  Service: ENT;  Laterality: N/A;   TONSILLECTOMY     US ECHOCARDIOGRAPHY     Family History Family History  Problem Relation Age of Onset   Cancer Mother    Alzheimer's disease Mother    Stroke Mother    Heart attack Father    Diabetes Father    Hypertension Father    Hypertension Sister    Diabetes Sister    Alzheimer's disease Maternal Aunt        total of 4 maternal aunts with AD   Alzheimer's disease Cousin    Alzheimer's disease Maternal Uncle     Social History Social History   Tobacco Use   Smoking status: Former   Smokeless tobacco: Never  Scientific laboratory technician Use: Never used  Substance Use Topics   Alcohol use: Not Currently    Comment: infrequent   Drug use: No   Allergies Beef-derived products, Chicken protein, Lisinopril, and Peanut-containing drug products  Review of Systems Review of Systems  All other systems reviewed and are negative.   Physical Exam Vital Signs  I have reviewed the triage vital signs BP 123/71   Pulse 90   Temp 98.2 F (36.8 C)   Resp 20   Ht '5\' 7"'$  (1.702 m)   Wt 97.5 kg   SpO2 94%   BMI 33.67 kg/m  Physical Exam Vitals and nursing note reviewed.  Constitutional:      General: He is not in acute distress.    Appearance: Normal appearance.  HENT:     Nose: Congestion and rhinorrhea present.     Mouth/Throat:     Mouth: Mucous membranes are moist.  Eyes:  Conjunctiva/sclera: Conjunctivae normal.  Cardiovascular:     Rate and Rhythm: Normal rate and regular rhythm.  Pulmonary:     Comments: Normal work of breathing, no tachypnea, speaking in full sentences, mild diffuse wheezing, possible focal basilar crackles in the left lung base Abdominal:     General: Abdomen is flat.     Palpations: Abdomen is soft.     Tenderness: There is no abdominal tenderness.  Musculoskeletal:     Right lower leg: No edema.     Left lower leg: No edema.  Skin:     General: Skin is warm and dry.     Capillary Refill: Capillary refill takes less than 2 seconds.  Neurological:     Mental Status: He is alert and oriented to person, place, and time. Mental status is at baseline.  Psychiatric:        Mood and Affect: Mood normal.        Behavior: Behavior normal.     ED Results and Treatments Labs (all labs ordered are listed, but only abnormal results are displayed) Labs Reviewed  BASIC METABOLIC PANEL - Abnormal; Notable for the following components:      Result Value   Sodium 128 (*)    Chloride 93 (*)    Glucose, Bld 123 (*)    All other components within normal limits  RESP PANEL BY RT-PCR (RSV, FLU A&B, COVID)  RVPGX2  CBC                                                                                                                          Radiology DG Chest 2 View  Result Date: 03/13/2022 CLINICAL DATA:  Shortness of breath, cough, and fever EXAM: CHEST - 2 VIEW COMPARISON:  Chest radiograph dated 12/05/2019 FINDINGS: Patchy left lower lobe opacity. Normal lung volumes. No pleural effusion or pneumothorax. The heart size and mediastinal contours are within normal limits. The visualized skeletal structures are unremarkable. IMPRESSION: Patchy left lower lobe opacity, suspicious for pneumonia. Electronically Signed   By: Darrin Nipper M.D.   On: 03/13/2022 09:22    Pertinent labs & imaging results that were available during my care of the patient were reviewed by me and considered in my medical decision making (see MDM for details).  Medications Ordered in ED Medications  acetaminophen (TYLENOL) tablet 1,000 mg (1,000 mg Oral Patient Refused/Not Given 03/13/22 1059)  Procedures Procedures  (including critical care time)  Medical Decision Making / ED Course   MDM:  77 year old male presenting to the emergency  department with shortness of breath.  Patient well-appearing, physical exam with mild diffuse wheezing but no increased work of breathing.  He is not hypoxic in the emergency department.  Able to ambulate without desaturation.  Suspect possible community-acquired pneumonia versus COPD exacerbation.  He does have a borderline fever on arrival.  Chest x-ray shows left lower lobe infiltrate and he does have some subtle changes in the left lower lobe.  He does report green phlegm production.  He is already being treated with steroids prescribed at his urgent care visit yesterday, advised patient to continue these, will also prescribe antibiotics to treat for community-acquired pneumonia/COPD exacerbation.  Suspect reading of 83 at home may have been a false reading, finger pulse oximeter's have been inaccurate and patient reports he did not feel particularly short of breath when he got that reading.  Low concern for other causes of shortness of breath, no leg swelling to suggest CHF exacerbation, no pneumothorax on chest x-ray, doubt PE without chest pain or hypoxia, patient not anemic.  Labs notable for mild hyponatremia likely due to mild dehydration possibly due to chlorthalidone.  Discussed increasing fluid intake at home and follow-up with primary doctor for recheck.   Will discharge patient to home. All questions answered. Patient comfortable with plan of discharge. Return precautions discussed with patient and specified on the after visit summary.  Clinical Course as of 03/13/22 1115  Fri Mar 13, 2022  9629 DG Chest 2 View [DR]    Clinical Course User Index [DR] Pattricia Boss, MD     Additional history obtained: -Additional history obtained from spouse -External records from outside source obtained and reviewed including: Chart review including previous notes, labs, imaging, consultation notes including UC visit yesterday   Lab Tests: -I ordered, reviewed, and interpreted labs.   The  pertinent results include:   Labs Reviewed  BASIC METABOLIC PANEL - Abnormal; Notable for the following components:      Result Value   Sodium 128 (*)    Chloride 93 (*)    Glucose, Bld 123 (*)    All other components within normal limits  RESP PANEL BY RT-PCR (RSV, FLU A&B, COVID)  RVPGX2  CBC    Notable for mild hyponatremia  EKG   EKG Interpretation  Date/Time:  Friday March 13 2022 08:23:34 EST Ventricular Rate:  93 PR Interval:  184 QRS Duration: 152 QT Interval:  402 QTC Calculation: 499 R Axis:   -5 Text Interpretation: Normal sinus rhythm Right bundle branch block Abnormal ECG When compared with ECG of 25-Apr-2013 21:42, RBBB now present Confirmed by Garnette Gunner [id number]) on 03/13/2022 10:43:31 AM         Imaging Studies ordered: I ordered imaging studies including CXR On my interpretation imaging demonstrates possible LLL PNA I independently visualized and interpreted imaging. I agree with the radiologist interpretation   Medicines ordered and prescription drug management: Meds ordered this encounter  Medications   acetaminophen (TYLENOL) tablet 1,000 mg   amoxicillin-clavulanate (AUGMENTIN) 875-125 MG tablet    Sig: Take 1 tablet by mouth every 12 (twelve) hours for 7 days.    Dispense:  14 tablet    Refill:  0   azithromycin (ZITHROMAX) 250 MG tablet    Sig: Take 1 tablet (250 mg total) by mouth daily. Take first 2 tablets together, then 1 every day until  finished.    Dispense:  6 tablet    Refill:  0    -I have reviewed the patients home medicines and have made adjustments as needed   Social Determinants of Health:  Diagnosis or treatment significantly limited by social determinants of health: obesity   Reevaluation: After the interventions noted above, I reevaluated the patient and found that they have improved  Co morbidities that complicate the patient evaluation  Past Medical History:  Diagnosis Date   Acid reflux disease     Arthritis    Asthma 04/30/2013   Atherosclerosis of coronary artery 04/30/2013   Ventricular fibrillation; circumflex stent x2 in 2004   Benign essential hypertension    CAP (community acquired pneumonia) 04/30/2013   Class 2 severe obesity with serious comorbidity and body mass index (BMI) of 39.0 to 39.9 in adult 06/04/2020   COPD, 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   Dyspnea on exertion 09/13/2017   Edema, lower extremity    Heart failure, diastolic    Hemorrhage of rectum and anus    Hyperlipidemia 06/26/2013   Hypoxia 04/30/2013   Influenza due to identified novel influenza A virus with other respiratory manifestations 05/01/2013   Internal hemorrhoids    Mild cognitive impairment of uncertain or unknown etiology 09/30/2021   Myocardial infarction 2004   Dr. Tamala Julian, at [facility], yearly visits   OSA treated with BiPAP 04/24/2018   Mild OSA with an AHI of 13.5/hr and O2 sats of 88%. Now on BiPAP at 13/9cm H2O.    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    Final Clinical Impression(s) / ED Diagnoses Final diagnoses:  Community acquired pneumonia of left lower lobe of lung  COPD exacerbation ([place])     This chart was dictated using voice recognition software.  Despite best efforts to proofread,  errors can occur which can change the documentation meaning.    Cristie Hem, MD 03/13/22 1115

## 2022-03-13 NOTE — ED Triage Notes (Signed)
Pt. Stated, I was at the UC at [facility] and given 2 medications for breathing yesterday and they said if my oxygen got low to come here. This morning it was 83% so here I am. Richard [name] had some cough and congestion since Saturday

## 2022-03-13 NOTE — Discharge Instructions (Signed)
We evaluated you for your shortness of breath.  Your chest x-ray today showed a possible pneumonia in your left lung.  Since you also have COPD, we will treat you with antibiotics for 7 days.  Please continue to use your nebulizers and take the steroids that were prescribed to you at urgent care.  You can continue to use the pulse oximeter, but they are not always accurate.  If you feel well, you do not need to check your oxygen saturation.  Please return to the department if you feel increased short of breath or difficulty breathing with even short distances, your cough worsens, you develop chest pain or leg swelling, you develop fainting, vomiting, or any other concerning symptoms.

## 2022-03-16 ENCOUNTER — Other Ambulatory Visit: Payer: Self-pay

## 2022-03-16 ENCOUNTER — Other Ambulatory Visit: Payer: Self-pay | Admitting: Interventional Cardiology

## 2022-03-16 ENCOUNTER — Other Ambulatory Visit (HOSPITAL_COMMUNITY): Payer: Self-pay

## 2022-03-16 MED ORDER — DAPAGLIFLOZIN PROPANEDIOL 10 MG PO TABS
10.0000 mg | ORAL_TABLET | Freq: Every day | ORAL | 0 refills | Status: DC
  Filled 2022-03-16: qty 90, 90d supply, fill #0

## 2022-03-17 ENCOUNTER — Other Ambulatory Visit (HOSPITAL_COMMUNITY): Payer: Self-pay

## 2022-03-27 ENCOUNTER — Other Ambulatory Visit: Payer: Self-pay | Admitting: Interventional Cardiology

## 2022-03-27 ENCOUNTER — Other Ambulatory Visit (HOSPITAL_COMMUNITY): Payer: Self-pay

## 2022-03-27 MED ORDER — CHLORTHALIDONE 25 MG PO TABS
12.5000 mg | ORAL_TABLET | Freq: Every day | ORAL | 0 refills | Status: DC
  Filled 2022-03-27: qty 15, 30d supply, fill #0

## 2022-03-30 ENCOUNTER — Encounter (INDEPENDENT_AMBULATORY_CARE_PROVIDER_SITE_OTHER): Payer: Self-pay | Admitting: Adult Health

## 2022-03-30 ENCOUNTER — Ambulatory Visit (INDEPENDENT_AMBULATORY_CARE_PROVIDER_SITE_OTHER): Payer: PPO | Admitting: Adult Health

## 2022-03-30 VITALS — BP 139/78 | HR 68 | Temp 98.4°F | Ht 69.0 in | Wt 208.0 lb

## 2022-03-30 DIAGNOSIS — Z6838 Body mass index (BMI) 38.0-38.9, adult: Secondary | ICD-10-CM | POA: Diagnosis not present

## 2022-03-30 DIAGNOSIS — Z6839 Body mass index (BMI) 39.0-39.9, adult: Secondary | ICD-10-CM

## 2022-03-30 DIAGNOSIS — E871 Hypo-osmolality and hyponatremia: Secondary | ICD-10-CM

## 2022-03-30 DIAGNOSIS — E669 Obesity, unspecified: Secondary | ICD-10-CM | POA: Diagnosis not present

## 2022-03-30 DIAGNOSIS — J189 Pneumonia, unspecified organism: Secondary | ICD-10-CM

## 2022-04-07 ENCOUNTER — Other Ambulatory Visit (HOSPITAL_BASED_OUTPATIENT_CLINIC_OR_DEPARTMENT_OTHER): Payer: Self-pay

## 2022-04-07 ENCOUNTER — Other Ambulatory Visit (HOSPITAL_COMMUNITY): Payer: Self-pay

## 2022-04-07 ENCOUNTER — Other Ambulatory Visit: Payer: Self-pay

## 2022-04-07 DIAGNOSIS — G4733 Obstructive sleep apnea (adult) (pediatric): Secondary | ICD-10-CM | POA: Diagnosis not present

## 2022-04-07 MED ORDER — IPRATROPIUM-ALBUTEROL 0.5-2.5 (3) MG/3ML IN SOLN
3.0000 mL | Freq: Two times a day (BID) | RESPIRATORY_TRACT | 4 refills | Status: DC
  Filled 2022-04-07: qty 360, 40d supply, fill #0
  Filled 2022-06-09: qty 360, 40d supply, fill #1
  Filled 2022-08-12: qty 360, 40d supply, fill #2
  Filled 2022-10-05: qty 360, 40d supply, fill #3
  Filled 2022-12-06: qty 360, 40d supply, fill #4

## 2022-04-07 MED ORDER — TAMSULOSIN HCL 0.4 MG PO CAPS
0.4000 mg | ORAL_CAPSULE | Freq: Every day | ORAL | 3 refills | Status: DC
  Filled 2022-04-07: qty 90, 90d supply, fill #0
  Filled 2022-07-05: qty 90, 90d supply, fill #1
  Filled 2022-10-11: qty 90, 90d supply, fill #2
  Filled 2023-01-05 (×2): qty 90, 90d supply, fill #3

## 2022-04-08 ENCOUNTER — Other Ambulatory Visit (HOSPITAL_COMMUNITY): Payer: Self-pay

## 2022-04-08 ENCOUNTER — Other Ambulatory Visit: Payer: Self-pay

## 2022-04-09 ENCOUNTER — Other Ambulatory Visit (HOSPITAL_COMMUNITY): Payer: Self-pay

## 2022-04-09 ENCOUNTER — Telehealth: Payer: Self-pay | Admitting: Internal Medicine

## 2022-04-09 MED ORDER — CARVEDILOL 12.5 MG PO TABS
12.5000 mg | ORAL_TABLET | Freq: Two times a day (BID) | ORAL | 0 refills | Status: DC
  Filled 2022-04-09: qty 180, 90d supply, fill #0

## 2022-04-09 NOTE — Telephone Encounter (Signed)
*  STAT* If patient is at the pharmacy, call can be transferred to refill team.   1. Which medications need to be refilled? (please list name of each medication and dose if known) carvedilol (COREG) 12.5 MG tablet    2. Which pharmacy/location (including street and city if local pharmacy) is medication to be sent to?  [facility] [place]   3. Do they need a 30 day or 90 day supply? 27   Pt has a f/u scheduled for 04/17/22 with Dr. Gasper Sells

## 2022-04-09 NOTE — Telephone Encounter (Signed)
Pt's medication was sent to pt's pharmacy as requested. Confirmation received.

## 2022-04-13 ENCOUNTER — Encounter: Payer: Self-pay | Admitting: Physician Assistant

## 2022-04-13 ENCOUNTER — Ambulatory Visit (INDEPENDENT_AMBULATORY_CARE_PROVIDER_SITE_OTHER): Payer: PPO | Admitting: Physician Assistant

## 2022-04-13 VITALS — BP 146/89 | HR 72 | Resp 18 | Ht 69.0 in | Wt 213.0 lb

## 2022-04-13 DIAGNOSIS — G3184 Mild cognitive impairment, so stated: Secondary | ICD-10-CM

## 2022-04-13 NOTE — Patient Instructions (Signed)
It was a pleasure to see you today at our office.   Recommendations:  Follow up in  6 months    Whom to call:  Memory  decline, memory medications: Call our office [phone]   For psychiatric meds, mood meds: Please have your primary care physician manage these medications.   Counseling regarding caregiver distress, including caregiver depression, anxiety and issues regarding community resources, adult day care programs, adult living facilities, or memory care questions:   Feel free to contact [place], Social Worker at [phone]   For assessment of decision of mental capacity and competency:  Call Dr. Anthoney Harada, geriatric psychiatrist at [phone]  For guidance in geriatric dementia issues please call [facility] [phone]    Feel free to go to the following database for funded clinical studies conducted around the world: http://saunders.com/   https://www.triadclinicaltrials.com/     RECOMMENDATIONS FOR ALL PATIENTS WITH MEMORY PROBLEMS: 1. Continue to exercise (Recommend 30 minutes of walking everyday, or 3 hours every week) 2. Increase social interactions - continue going to [place] and enjoy social gatherings with friends and family 3. Eat healthy, avoid fried foods and eat more fruits and vegetables 4. Maintain adequate blood pressure, blood sugar, and blood cholesterol level. Reducing the risk of stroke and cardiovascular disease also helps promoting better memory. 5. Avoid stressful situations. Live a simple life and avoid aggravations. Organize your time and prepare for the next day in anticipation. 6. Sleep well, avoid any interruptions of sleep and avoid any distractions in the bedroom that may interfere with adequate sleep quality 7. Avoid sugar, avoid sweets as there is a strong link between excessive sugar intake, diabetes, and cognitive impairment We discussed the Mediterranean diet, which has been shown to help patients  reduce the risk of progressive memory disorders and reduces cardiovascular risk. This includes eating fish, eat fruits and green leafy vegetables, nuts like almonds and hazelnuts, walnuts, and also use olive oil. Avoid fast foods and fried foods as much as possible. Avoid sweets and sugar as sugar use has been linked to worsening of memory function.  There is always a concern of gradual progression of memory problems. If this is the case, then we may need to adjust level of care according to patient needs. Support, both to the patient and caregiver, should then be put into place.    FALL PRECAUTIONS: Be cautious when walking. Scan the area for obstacles that may increase the risk of trips and falls. When getting up in the mornings, sit up at the edge of the bed for a few minutes before getting out of bed. Consider elevating the bed at the head end to avoid drop of blood pressure when getting up. Walk always in a well-lit room (use night lights in the walls). Avoid area rugs or power cords from appliances in the middle of the walkways. Use a walker or a cane if necessary and consider physical therapy for balance exercise. Get your eyesight checked regularly.  FINANCIAL OVERSIGHT: Supervision, especially oversight when making financial decisions or transactions is also recommended.  HOME SAFETY: Consider the safety of the kitchen when operating appliances like stoves, microwave oven, and blender. Consider having supervision and share cooking responsibilities until no longer able to participate in those. Accidents with firearms and other hazards in the house should be identified and addressed as well.   ABILITY TO BE LEFT ALONE: If patient is unable to contact 911 operator, consider using LifeLine, or when the need is  there, arrange for someone to stay with patients. Smoking is a fire hazard, consider supervision or cessation. Risk of wandering should be assessed by caregiver and if detected at any point,  supervision and safe proof recommendations should be instituted.  MEDICATION SUPERVISION: Inability to self-administer medication needs to be constantly addressed. Implement a mechanism to ensure safe administration of the medications.   DRIVING: Regarding driving, in patients with progressive memory problems, driving will be impaired. We advise to have someone else do the driving if trouble finding directions or if minor accidents are reported. Independent driving assessment is available to determine safety of driving.   If you are interested in the driving assessment, you can contact the following:  [employer] in [place]  [place] [phone]  [place]  [facility] [phone] or [phone]

## 2022-04-13 NOTE — Progress Notes (Signed)
Assessment/Plan:      Mild Cognitive Impairment of unclear etiology  Richard [name] is a very pleasant 77 y.o. RH male with history of COPD, prediabetes, OSA on BiPAP, HOH, B12 deficiency, extensive cardiac history, and strong family history of Alzheimer's disease, presenting in follow-up for evaluation of memory difficulties.  Personally reviewed MRI of the brain was unremarkable, without acute findings, but with mild chronic small vessel disease.  He is not on antidementia medication at this time. Memory is stable, MMSE today at 29/30    Recommendations:   Follow up in  6 months. Continue BiPAP for OSA Continue good control of cardiovascular risk factors Continue to control mood as per PCP Continue B12 supplement    No antidementia medication is indicated for now                                                                                                   Subjective:   This patient is accompanied in the office by his wife  who supplements the history. Previous records as well as any outside records available were reviewed prior to todays visit.   Patient was last seen on 10/14/2021.  Last MoCA on 03/27/2021 was 26/30    Any changes in memory since last visit? About the same, no significant changes. Able to remember conversations and names. Enjoys crossword puzzles.  repeats oneself?  Endorsed by wife  Disoriented when walking into a room?  Patient denies  Leaving objects in unusual places?  Patient denies   Wandering behavior?   denies   Any changes since last visit?   denies   Any worsening depression?: denies   Hallucinations or paranoia?  denies   Seizures?   denies    Any sleep changes?  Denies  vivid dreams,  He moves during his sleep, denies sleepwalking  Sleep apnea?   Endorsed, he is on BiPAP. Any hygiene concerns?   denies   Independent of bathing and dressing?  Endorsed  Does the patient needs help with medications? Patient  is in charge   Who is in  charge of the finances? Wife  is in charge     Any changes in appetite?  denies    Patient have trouble swallowing?  denies   Does the patient cook? Yes   Any kitchen accidents such as leaving the stove on?   denies   Any headaches?    denies   Vision changes? denies Chronic back pain  denies   Ambulates with difficulty?    denies   Recent falls or head injuries?    denies     Unilateral weakness, numbness or tingling?   denies   Any tremors?  denies   Any anosmia?  Reduced sense of smell after sinus procedure  Any incontinence of urine?  Nocturia 3 x a night. Takes Flomax.  Any bowel dysfunction?  denies      Patient lives  wife  Does the patient drive?He does not drive as much, drives short distances     Initial Visit 03/27/21 The patient is seen in neurologic  consultation at the request of Richard Liberty, MD for the evaluation of memory.  The patient is accompanied by his wife who supplements the history. This is a 77 y.o. year old RH  male who has had memory issues for about "a while" during the visit to their daughter in [place], she noticed that there was a mild decline in his father's cognition.  He was asking the same questions within minutes, and if confronted about it, he would become defensive.  He is less detail oriented than before.  He denies repeating the same stories.  Sometimes, "he cannot find what he is looking for "when he gets to a room.  His mood may be changing, every day he does his wife "has not been a good day, has not started well "but when asked to elaborate, he "snaps "he continues to drive without any issues.  He enjoys doing crossword puzzles and word finding.  He sleeps well, except when he has to get up to urinate which is between 3 and 4 times a night.  His wife states that he tosses and turns and his hands move during the night, especially than in the last 8 months, but he denies having any vivid dreams.  He snores even with CPAP.  He denies any sleepwalking.   He denies any hallucinations or paranoia.  No hygiene concerns.  His medications are in a pillbox, he denies forgetting any doses.  His wife always did the finances.  His appetite is good, denies trouble swallowing.  Over the last 8 months, he has been in and controlled diet, but he discontinued all forms of sodium intake, which in turn turned to be provoking hyponatremia.  "He is all or nothing-his wife says ".  He cooks and denies leaving the stove on accidentally or forgetting common recipe.  He denies any head injuries, falls, seizure, or history of headaches.  He denies any vision changes, dizziness, focal numbness or tingling, unilateral weakness or tremors or anosmia.  No reported incontinence, retention, constipation or diarrhea.  He denies any alcohol, or tobacco.  Family history strong for Alzheimer's disease in mother, and her 41 sisters.  He is retired from [employer], he retired 4 years ago     Neuropsych evaluation 10/07/2021 briefly, results suggested a primary impairment across encoding (i.e., learning) aspects of verbal memory. Additional performance variability was exhibited across executive functioning, visuospatial abilities, and retrieval aspects of memory. The etiology of ongoing dysfunction is unclear at the present time. Specific to his concerns surrounding Alzheimer's disease, current testing does not yield a pattern consistent with typical presentations of this illness. Delayed retrieval of previously learned information was improved relative to initial learning abilities from a normative standpoint. Performances were also appropriate across recognition trials and not suggestive of rapid forgetting or a notable storage deficit. Impairments encoding novel information could be related to hearing loss given that both he and his wife reported ongoing concerns and that he likely needs a formal hearing evaluation. Deficits surrounding executive functioning and visuospatial  abilities can be seen in certain neurodegenerative illnesses early on, specifically Lewy body disease and corticobasal degeneration. However, Richard [name] does not currently display any behavioral features of these illnesses (e.g., REM sleep behaviors, fully-formed hallucinations, myoclonic jerking, alien limb experiences, gait abnormalities, rigidity/bradykinesia). While this makes these conditions less likely, continued monitoring will be important, especially if any of these symptoms emerge in the future.        MRI brain 04/26/21 No acute  intracranial abnormality. Mild for age signal changes.suggestive of chronic small vessel disease. 2. 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    Labs 03/27/21 B12 206,  TSH nl at 1.01    Past Medical History:  Diagnosis Date   Acid reflux disease    Arthritis    Asthma 04/30/2013   Atherosclerosis of coronary artery 04/30/2013   Ventricular fibrillation; circumflex stent x2 in 2004   Benign essential hypertension    CAP (community acquired pneumonia) 04/30/2013   Class 2 severe obesity with serious comorbidity and body mass index (BMI) of 39.0 to 39.9 in adult 06/04/2020   COPD, 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   Dyspnea on exertion 09/13/2017   Edema, lower extremity    Heart failure, diastolic    Hemorrhage of rectum and anus    Hyperlipidemia 06/26/2013   Hypoxia 04/30/2013   Influenza due to identified novel influenza A virus with other respiratory manifestations 05/01/2013   Internal hemorrhoids    Mild cognitive impairment of uncertain or unknown etiology 09/30/2021   Myocardial infarction 2004   Dr. Tamala Julian, at [facility], yearly visits   OSA treated with BiPAP 04/24/2018   Mild OSA with an AHI of 13.5/hr and O2 sats of 88%. Now on BiPAP at 13/9cm H2O.    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   Prediabetes 04/09/2020   Pure  hypercholesterolemia    Right bundle branch block 06/27/2014    EKG performed 06/27/2014   Vitamin D deficiency      Past Surgical History:  Procedure Laterality Date   CARDIAC CATHETERIZATION  2004   with 2 stents   CARDIOVASCULAR STRESS TEST  2011   COLONOSCOPY     POLYPECTOMY  02/17/2011   Procedure: POLYPECTOMY NASAL;  Surgeon: Delsa Bern;  Location: [facility];  Service: ENT;  Laterality: N/A;   SINUS ENDO W/FUSION  02/17/2011   Procedure: ENDOSCOPIC SINUS SURGERY WITH FUSION NAVIGATION;  Surgeon: Delsa Bern;  Location: [facility];  Service: ENT;  Laterality: N/A;   TONSILLECTOMY     US ECHOCARDIOGRAPHY       PREVIOUS MEDICATIONS:   CURRENT MEDICATIONS:  Outpatient Encounter Medications as of 04/13/2022  Medication Sig   amLODipine (NORVASC) 5 MG tablet Take 1 tablet (5 mg total) by mouth daily.   aspirin EC 81 MG tablet Take 1 tablet (81 mg total) by mouth daily.   atorvastatin (LIPITOR) 80 MG tablet Take 1 tablet (80 mg total) by mouth at bedtime.   atorvastatin (LIPITOR) 80 MG tablet Take 1 tablet (80 mg total) by mouth at bedtime.   benzonatate (TESSALON) 100 MG capsule Take 1 capsule (100 mg total) by mouth every 8 (eight) hours as needed for cough.   carvedilol (COREG) 12.5 MG tablet Take 1 tablet (12.5 mg total) by mouth 2 (two) times daily.   chlorthalidone (HYGROTON) 25 MG tablet Take 1/2 tablet (12.5 mg total) by mouth daily.   COVID-19 mRNA Vac-TriS, Pfizer, (PFIZER-BIONT COVID-19 VAC-TRIS) SUSP injection Inject into the muscle.   dapagliflozin propanediol (FARXIGA) 10 MG TABS tablet 1 tablet in the morning   dapagliflozin propanediol (FARXIGA) 10 MG TABS tablet Take 1 tablet (10 mg total) by mouth daily before breakfast. Please schedule yearly appointment for future refills. Thank you   ergocalciferol (VITAMIN D2) 1.25 MG (50000 UT) capsule Take 1 capsule (50,000 Units total) by mouth once a week.   ezetimibe (ZETIA) 10 MG tablet Take 1  tablet (10 mg total) by  mouth daily.   ezetimibe (ZETIA) 10 MG tablet Take 1 tablet (10 mg total) by mouth daily.   fluticasone furoate-vilanterol (BREO ELLIPTA) 200-25 MCG/ACT AEPB Inhale 1 puff into the lungs daily.   fluticasone furoate-vilanterol (BREO ELLIPTA) 200-25 MCG/ACT AEPB Inhale 1 puff into the lungs daily.   fluticasone furoate-vilanterol (BREO ELLIPTA) 200-25 MCG/INH AEPB Inhale 1 puff into the lungs daily.   guaiFENesin (MUCINEX) 600 MG 12 hr tablet Take by mouth 2 (two) times daily.   ipratropium-albuterol (DUONEB) 0.5-2.5 (3) MG/3ML SOLN Take 3 mLs by nebulization 2 times daily and if needed once at bedtime   ipratropium-albuterol (DUONEB) 0.5-2.5 (3) MG/3ML SOLN Inhale 3 mLs by nebulization 2 (two) times daily and once at bedtime as needed.   montelukast (SINGULAIR) 10 MG tablet Take 1 tablet (10 mg total) by mouth daily.   nitroGLYCERIN (NITROSTAT) 0.4 MG SL tablet Place 1 tablet (0.4 mg total) under the tongue every 5 (five) minutes as needed. For chest pain   omeprazole (PRILOSEC) 40 MG capsule Take 1 capsule (40 mg total) by mouth 2 (two) times daily.   polyethylene glycol powder (GLYCOLAX/MIRALAX) 17 GM/SCOOP powder See admin instructions.   polyethylene glycol-electrolytes (NULYTELY) 420 g solution Use as directed   potassium chloride (MICRO-K) 10 MEQ CR capsule Take 1 capsule (10 mEq total) by mouth daily.   tamsulosin (FLOMAX) 0.4 MG CAPS capsule Take 1 capsule (0.4 mg total) by mouth daily.   tamsulosin (FLOMAX) 0.4 MG CAPS capsule Take 1 capsule (0.4 mg total) by mouth daily.   ipratropium-albuterol (DUONEB) 0.5-2.5 (3) MG/3ML SOLN USE 1 VIAL VIA NEBULIZER 2 TIMES DAILY & AT BEDTIME IF NEEDED   omeprazole (PRILOSEC) 40 MG capsule TAKE 1 CAPSULE BY MOUTH TWICE DAILY.   No facility-administered encounter medications on file as of 04/13/2022.     Objective:     PHYSICAL EXAMINATION:    VITALS:   Vitals:   04/13/22 1459  BP: (!) 146/89  Pulse: 72  Resp: 18  SpO2: 98%  Weight: 213 lb  (96.6 kg)  Height: '5\' 9"'$  (1.753 m)    GEN:  The patient appears stated age and is in NAD. HEENT:  Normocephalic, atraumatic.   Neurological examination:  General: NAD, well-groomed, appears stated age. Orientation: The patient is alert. Oriented to person, place and date Cranial nerves: There is good facial symmetry.The speech is fluent and clear. No aphasia or dysarthria. Fund of knowledge is appropriate. Recent memory impaired and remote memory is normal.  Attention and concentration are normal.  Able to name objects and repeat phrases.  Hearing is intact to conversational tone.   Delayed recall 2/3 Sensation: Sensation is intact to light touch throughout Motor: Strength is at least antigravity x4. Tremors: none  DTR's 2/4 in UE/LE      03/27/2021   10:00 AM  Montreal Cognitive Assessment   Visuospatial/ Executive (0/5) 2  Naming (0/3) 3  Attention: Read list of digits (0/2) 2  Attention: Read list of letters (0/1) 1  Attention: Serial 7 subtraction starting at 100 (0/3) 3  Language: Repeat phrase (0/2) 2  Language : Fluency (0/1) 1  Abstraction (0/2) 2  Delayed Recall (0/5) 4  Orientation (0/6) 6  Total 26       04/13/2022    3:00 PM  MMSE - Mini Mental State Exam  Orientation to time 5  Orientation to Place 5  Registration 3  Attention/ Calculation 5  Recall 2  Language- name  2 objects 2  Language- repeat 1  Language- follow 3 step command 3  Language- read & follow direction 1  Write a sentence 1  Copy design 1  Total score 29       Movement examination: Tone: There is normal tone in the UE/LE Abnormal movements:  no tremor.  No myoclonus.  No asterixis.   Coordination:  There is no decremation with RAM's. Normal finger to nose  Gait and Station: The patient has no difficulty arising out of a deep-seated chair without the use of the hands. The patient's stride length is good.  Gait is cautious and narrow.   Thank you for allowing [place] the opportunity to  participate in the care of this nice patient. Please do not hesitate to contact us for any questions or concerns.   Total time spent on today's visit was 25 minutes dedicated to this patient today, preparing to see patient, examining the patient, ordering tests and/or medications and counseling the patient, documenting clinical information in the EHR or other health record, independently interpreting results and communicating results to the patient/family, discussing treatment and goals, answering patient's questions and coordinating care.  Cc:  Richard Liberty, MD  Sharene Butters 04/13/2022 3:50 PM

## 2022-04-13 NOTE — Progress Notes (Unsigned)
Chief Complaint:   OBESITY Richard [name] is here to discuss his progress with his obesity treatment plan along with follow-up of his obesity related diagnoses. Richard [name] is on the Category 2 Plan +85 protein daily and states he is following his eating plan approximately 70% of the time. Richard [name] states he is not exercising.  Today's visit was #: 52 Starting weight: 258 lbs Starting date: 04/08/2021 Today's weight: 208 lbs Today's date: 03/30/2022 Total lbs lost to date: 50 lbs Total lbs lost since last in-office visit: 2 lbs  Interim History: ***  Subjective:   1. Community acquired pneumonia of left lower lobe of lung ***2004  2. Hyponatremia Epic review demonstrates chronically low sodium.  Chlorthalidone 25 mg half tab daily, cardiology. Sharp as a tack.   Assessment/Plan:   1. Community acquired pneumonia of left lower lobe of lung Remain hydrated.  Resume regular exercise as tolerated.  2. Hyponatremia Changed to regular sandwich meat monitor for change in ***  3. Obesity,current BMI 30.8 Richard [name] is currently in the action stage of change. As such, his goal is to continue with weight loss efforts. He has agreed to the Category 2 Plan+ 85 protein daily.   Exercise goals:  2-3 days per week at the gym.  Behavioral modification strategies: increasing lean protein intake, decreasing simple carbohydrates, meal planning and cooking strategies, keeping healthy foods in the home, and planning for success.  Richard [name] has agreed to follow-up with our clinic in 4 weeks. He was informed of the importance of frequent follow-up visits to maximize his success with intensive lifestyle modifications for his multiple health conditions.   Objective:   Blood pressure 139/78, pulse 68, temperature 98.4 F (36.9 C), height '5\' 9"'$  (1.753 m), weight 208 lb (94.3 kg), SpO2 100 %. Body mass index is 30.72 kg/m.  General: Cooperative, alert, well developed, in no acute distress. HEENT: Conjunctivae and  lids unremarkable. Cardiovascular: Regular rhythm.  Lungs: Normal work of breathing. Neurologic: No focal deficits.   Lab Results  Component Value Date   CREATININE 1.00 03/13/2022   BUN 22 03/13/2022   NA 128 (L) 03/13/2022   K 3.5 03/13/2022   CL 93 (L) 03/13/2022   CO2 24 03/13/2022   Lab Results  Component Value Date   ALT 13 12/09/2020   AST 22 12/09/2020   ALKPHOS 124 (H) 12/09/2020   BILITOT 0.6 12/09/2020   Lab Results  Component Value Date   HGBA1C 6.0 (H) 12/09/2020   HGBA1C 6.3 (H) 07/24/2020   HGBA1C 6.2 (H) 04/08/2020   HGBA1C 6.1 (H) 04/30/2013   Lab Results  Component Value Date   INSULIN 12.3 07/24/2020   INSULIN 5.3 04/08/2020   Lab Results  Component Value Date   TSH 1.01 03/27/2021   Lab Results  Component Value Date   CHOL 157 12/09/2020   HDL 63 12/09/2020   LDLCALC 79 12/09/2020   TRIG 78 12/09/2020   CHOLHDL 2.8 07/26/2019   Lab Results  Component Value Date   VD25OH 72.2 12/09/2020   VD25OH 53.6 07/24/2020   VD25OH 60.0 04/08/2020   Lab Results  Component Value Date   WBC 9.2 03/13/2022   HGB 13.9 03/13/2022   HCT 40.4 03/13/2022   MCV 85.4 03/13/2022   PLT 230 03/13/2022   No results found for: "IRON", "TIBC", "FERRITIN"  Attestation Statements:   Reviewed by clinician on day of visit: allergies, medications, problem list, medical history, surgical history, family history, social history, and previous encounter notes.  I, Davy Pique, RMA, am acting as Location manager for Mina Marble, NP.  I have reviewed the above documentation for accuracy and completeness, and I agree with the above. -  ***

## 2022-04-16 ENCOUNTER — Ambulatory Visit: Payer: PPO | Admitting: Physician Assistant

## 2022-04-17 ENCOUNTER — Encounter: Payer: Self-pay | Admitting: Internal Medicine

## 2022-04-17 ENCOUNTER — Ambulatory Visit: Payer: PPO | Attending: Internal Medicine | Admitting: Internal Medicine

## 2022-04-17 VITALS — BP 130/66 | HR 76 | Ht 67.0 in | Wt 215.0 lb

## 2022-04-17 DIAGNOSIS — I4510 Unspecified right bundle-branch block: Secondary | ICD-10-CM

## 2022-04-17 DIAGNOSIS — I5032 Chronic diastolic (congestive) heart failure: Secondary | ICD-10-CM | POA: Insufficient documentation

## 2022-04-17 DIAGNOSIS — E785 Hyperlipidemia, unspecified: Secondary | ICD-10-CM

## 2022-04-17 DIAGNOSIS — I2510 Atherosclerotic heart disease of native coronary artery without angina pectoris: Secondary | ICD-10-CM

## 2022-04-17 HISTORY — DX: Chronic diastolic (congestive) heart failure: I50.32

## 2022-04-17 NOTE — Patient Instructions (Signed)
Medication Instructions:  Your physician recommends that you continue on your current medications as directed. Please refer to the Current Medication list given to you today.  *If you need a refill on your cardiac medications before your next appointment, please call your pharmacy*   Lab Work: NONE If you have labs (blood work) drawn today and your tests are completely normal, you will receive your results only by: [facility] (if you have MyChart) OR A paper copy in the mail If you have any lab test that is abnormal or we need to change your treatment, we will call you to review the results.   Testing/Procedures: Your physician has requested that you have an echocardiogram. Echocardiography is a painless test that uses sound waves to create images of your heart. It provides your doctor with information about the size and shape of your heart and how well your heart's chambers and valves are working. This procedure takes approximately one hour. There are no restrictions for this procedure. Please do NOT wear cologne, perfume, aftershave, or lotions (deodorant is allowed). Please arrive 15 minutes prior to your appointment time.    Follow-Up: At [facility], you and your health needs are our priority.  As part of our continuing mission to provide you with exceptional heart care, we have created designated Provider Care Teams.  These Care Teams include your primary Cardiologist (physician) and Advanced Practice Providers (APPs -  Physician Assistants and Nurse Practitioners) who all work together to provide you with the care you need, when you need it.   Your next appointment:   1 year(s)  Provider:   Rudean Haskell, MD

## 2022-04-17 NOTE — Progress Notes (Signed)
Cardiology Office Note:    Date:  04/17/2022   ID:  Richard [name], DOB 01-26-1946, MRN [id number]  PCP:  Vincente Liberty, MD   [place] [facility] Cardiologist:  Fransico Him, MD     Referring MD: Vincente Liberty, MD   CC: Transition to new cardiologist  History of Present Illness:    Richard [name] is a 77 y.o. male with a hx of CAD with MI in 2004, LCX PCI in 2004 with ischemia and VF.  HLD, HTN, and OSA on CPAP.  Has Asthma.  Has OSA and sees Dr. Radford Pax. 2023: Started of Farxiga for HFpEF and improvement in symptoms.  Patient notes that he is doing well.   Since last visit notes that he can gold and do ADLs  He is losing weight. In health weight and wellness and doing well. There are no interval hospital/ED visit.    No chest pain or pressure.   Chest discomfort and indigestion was his anginal equivalent. No SOB/DOE and no PND/Orthopnea. No palpitations or syncope.   Past Medical History:  Diagnosis Date   Acid reflux disease    Arthritis    Asthma 04/30/2013   Atherosclerosis of coronary artery 04/30/2013   Ventricular fibrillation; circumflex stent x2 in 2004   Benign essential hypertension    CAP (community acquired pneumonia) 04/30/2013   Class 2 severe obesity with serious comorbidity and body mass index (BMI) of 39.0 to 39.9 in adult 06/04/2020   COPD, 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   Dyspnea on exertion 09/13/2017   Edema, lower extremity    Heart failure, diastolic    Hemorrhage of rectum and anus    Hyperlipidemia 06/26/2013   Hypoxia 04/30/2013   Influenza due to identified novel influenza A virus with other respiratory manifestations 05/01/2013   Internal hemorrhoids    Mild cognitive impairment of uncertain or unknown etiology 09/30/2021   Myocardial infarction 2004   Dr. Tamala Julian, at [facility], yearly visits   OSA treated with BiPAP 04/24/2018   Mild OSA with an AHI of 13.5/hr and O2 sats of 88%. Now on BiPAP at 13/9cm H2O.    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   Prediabetes 04/09/2020   Pure hypercholesterolemia    Right bundle branch block 06/27/2014    EKG performed 06/27/2014   Vitamin D deficiency     Past Surgical History:  Procedure Laterality Date   CARDIAC CATHETERIZATION  2004   with 2 stents   CARDIOVASCULAR STRESS TEST  2011   COLONOSCOPY     POLYPECTOMY  02/17/2011   Procedure: POLYPECTOMY NASAL;  Surgeon: Delsa Bern;  Location: [facility];  Service: ENT;  Laterality: N/A;   SINUS ENDO W/FUSION  02/17/2011   Procedure: ENDOSCOPIC SINUS SURGERY WITH FUSION NAVIGATION;  Surgeon: Delsa Bern;  Location: [facility];  Service: ENT;  Laterality: N/A;   TONSILLECTOMY     US ECHOCARDIOGRAPHY      Current Medications: Current Meds  Medication Sig   amLODipine (NORVASC) 5 MG tablet Take 1 tablet (5 mg total) by mouth daily.   aspirin EC 81 MG tablet Take 1 tablet (81 mg total) by mouth daily.   atorvastatin (LIPITOR) 80 MG tablet Take 1 tablet (80 mg total) by mouth at bedtime.   benzonatate (TESSALON) 100 MG capsule Take 1 capsule (100 mg total) by mouth every 8 (eight) hours as needed for cough.   carvedilol (COREG) 12.5 MG tablet Take 1 tablet (12.5 mg  total) by mouth 2 (two) times daily.   chlorthalidone (HYGROTON) 25 MG tablet Take 1/2 tablet (12.5 mg total) by mouth daily.   dapagliflozin propanediol (FARXIGA) 10 MG TABS tablet Take 1 tablet (10 mg total) by mouth daily before breakfast. Please schedule yearly appointment for future refills. Thank you   ergocalciferol (VITAMIN D2) 1.25 MG (50000 UT) capsule Take 1 capsule (50,000 Units total) by mouth once a week.   ezetimibe (ZETIA) 10 MG tablet Take 1 tablet (10 mg total) by mouth daily.   fluticasone furoate-vilanterol (BREO ELLIPTA) 200-25 MCG/ACT AEPB Inhale 1 puff into the lungs daily.   guaiFENesin (MUCINEX) 600 MG 12 hr tablet Take by mouth 2 (two) times daily.   ipratropium-albuterol (DUONEB) 0.5-2.5  (3) MG/3ML SOLN Inhale 3 mLs by nebulization 2 (two) times daily and once at bedtime as needed.   montelukast (SINGULAIR) 10 MG tablet Take 1 tablet (10 mg total) by mouth daily.   nitroGLYCERIN (NITROSTAT) 0.4 MG SL tablet Place 1 tablet (0.4 mg total) under the tongue every 5 (five) minutes as needed. For chest pain   omeprazole (PRILOSEC) 40 MG capsule Take 1 capsule (40 mg total) by mouth 2 (two) times daily.   polyethylene glycol powder (GLYCOLAX/MIRALAX) 17 GM/SCOOP powder See admin instructions.   polyethylene glycol-electrolytes (NULYTELY) 420 g solution Use as directed   potassium chloride (MICRO-K) 10 MEQ CR capsule Take 1 capsule (10 mEq total) by mouth daily.   tamsulosin (FLOMAX) 0.4 MG CAPS capsule Take 1 capsule (0.4 mg total) by mouth daily.     Allergies:   Beef-derived products, Chicken protein, Lisinopril, and Peanut-containing drug products   Social History   Socioeconomic History   Marital status: Married    Spouse name: Wyanet   Number of children: Not on file   Years of education: 17   Highest education level: Bachelor's degree (e.g., BA, AB, BS)  Occupational History   Not on file  Tobacco Use   Smoking status: Former   Smokeless tobacco: Never  Vaping Use   Vaping Use: Never used  Substance and Sexual Activity   Alcohol use: Not Currently    Comment: infrequent   Drug use: No   Sexual activity: Not on file  Other Topics Concern   Not on file  Social History Narrative   Right handed   One story home   Drinks caffeine   Lives with wife   retired   Science writer Determinants of Radio broadcast assistant Strain: Not on file  Food Insecurity: Not on file  Transportation Needs: Not on file  Physical Activity: Not on file  Stress: Not on file  Social Connections: Not on file    Family History: The patient's family history includes Alzheimer's disease in his cousin, maternal aunt, maternal uncle, and mother; Cancer in his mother; Diabetes in his father  and sister; Heart attack in his father; Hypertension in his father and sister; Stroke in his mother.  ROS:   Please see the history of present illness.     All other systems reviewed and are negative.  EKGs/Labs/Other Studies Reviewed:    The following studies were reviewed today:  EKG:  EKG is  ordered today.  The ekg ordered today demonstrates  03/16/22: SR RBBB  Cardiac Studies & Procedures     STRESS TESTS  MYOCARDIAL PERFUSION IMAGING 04/27/2017  Narrative  There was no ST segment deviation noted during stress.  The study is normal.  This is a low risk study.  Normal pharmacologic nuclear study with no evidence for prior infarct or ischemia.   ECHOCARDIOGRAM  ECHOCARDIOGRAM COMPLETE 05/17/2017  Narrative *[facility]* [address], [place] [address] [phone]  ------------------------------------------------------------------- Transthoracic Echocardiography  Patient:    Merlen, Kohlhoff MR #:       [id number] Study Date: 05/17/2017 Gender:     M Age:        39 Height:     172.7 cm Weight:     115.4 kg BSA:        2.4 m^2 Pt. Status: Room:  ATTENDING    Aram Beecham     Isaiah Serge REFERRING    Cecilie Kicks R SONOGRAPHER  Marygrace Drought, RCS PERFORMING   [facility], Outpatient  cc:  ------------------------------------------------------------------- LV EF: 55% -   60%  ------------------------------------------------------------------- Indications:      SOB (R06.02).  ------------------------------------------------------------------- History:   PMH:  Hyperlipidemia.  Coronary artery disease.  PMH: Myocardial infarction.  Risk factors:  Hypertension.  ------------------------------------------------------------------- Study Conclusions  - Left ventricle: The cavity size was normal. There was moderate concentric hypertrophy. Systolic function was normal. The estimated ejection fraction was in the range of 55%  to 60%. Wall motion was normal; there were no regional wall motion abnormalities. Doppler parameters are consistent with abnormal left ventricular relaxation (grade 1 diastolic dysfunction). Doppler parameters are consistent with indeterminate ventricular filling pressure. - Aortic valve: Transvalvular velocity was within the normal range. There was no stenosis. There was no regurgitation. - Mitral valve: Transvalvular velocity was within the normal range. There was no evidence for stenosis. There was trivial regurgitation. - Right ventricle: The cavity size was normal. Wall thickness was normal. - Atrial septum: No defect or patent foramen ovale was identified by color flow Doppler. - Tricuspid valve: There was trivial regurgitation. - Pulmonary arteries: Systolic pressure was within the normal range. - Global longitudinal strain -19.0% (normal).  ------------------------------------------------------------------- Study data:  No prior study was available for comparison.  Study status:  Routine.  Procedure:  The patient reported no pain pre or post test. Transthoracic echocardiography. Image quality was adequate.          Transthoracic echocardiography.  M-mode, complete 2D, spectral Doppler, and color Doppler.  Birthdate: Patient birthdate: 11/02/1945.  Age:  Patient is 77 yr old.  Sex: Gender: male.    BMI: 38.7 kg/m^2.  Blood pressure:     126/80 Patient status:  Outpatient.  Study date:  Study date: 05/17/2017. Study time: 09:25 AM.  Location:  [facility]  -------------------------------------------------------------------  ------------------------------------------------------------------- Left ventricle:  The cavity size was normal. There was moderate concentric hypertrophy. Systolic function was normal. The estimated ejection fraction was in the range of 55% to 60%. Wall motion was normal; there were no regional wall motion abnormalities. Doppler parameters are  consistent with abnormal left ventricular relaxation (grade 1 diastolic dysfunction). Doppler parameters are consistent with indeterminate ventricular filling pressure.  ------------------------------------------------------------------- Aortic valve:   Trileaflet; normal thickness leaflets. Mobility was not restricted.  Doppler:  Transvalvular velocity was within the normal range. There was no stenosis. There was no regurgitation.  ------------------------------------------------------------------- Aorta:  Aortic root: The aortic root was normal in size.  ------------------------------------------------------------------- Mitral valve:   Structurally normal valve.   Mobility was not restricted.  Doppler:  Transvalvular velocity was within the normal range. There was no evidence for stenosis. There was trivial regurgitation.  ------------------------------------------------------------------- Left atrium:  The atrium was normal in size.  ------------------------------------------------------------------- Atrial septum:  No defect or patent foramen ovale was identified by color flow Doppler.  ------------------------------------------------------------------- Right ventricle:  The cavity size was normal. Wall thickness was normal. Systolic function was normal.  ------------------------------------------------------------------- Pulmonic valve:    Structurally normal valve.   Cusp separation was normal.  Doppler:  Transvalvular velocity was within the normal range. There was no evidence for stenosis. There was trivial regurgitation.  ------------------------------------------------------------------- Tricuspid valve:   Structurally normal valve.    Doppler: Transvalvular velocity was within the normal range. There was trivial regurgitation.  ------------------------------------------------------------------- Pulmonary artery:   The main pulmonary artery was  normal-sized. Systolic pressure was within the normal range.  ------------------------------------------------------------------- Right atrium:  The atrium was normal in size.  ------------------------------------------------------------------- Pericardium:  There was no pericardial effusion.  ------------------------------------------------------------------- Systemic veins: Inferior vena cava: The vessel was normal in size. The respirophasic diameter changes were in the normal range (= 50%), consistent with normal central venous pressure. Diameter: 11 mm.  ------------------------------------------------------------------- Measurements  IVC                                        Value        Reference ID                                         11    mm     ----------  Left ventricle                             Value        Reference LV ID, ED, PLAX chordal             (L)    38.9  mm     43 - 52 LV ID, ES, PLAX chordal                    27.2  mm     23 - 38 LV fx shortening, PLAX chordal             30    %      >=29 LV PW thickness, ED                        15.5  mm     ---------- IVS/LV PW ratio, ED                        0.8          <=1.3 Stroke volume, 2D                          68    ml     ---------- Stroke volume/bsa, 2D                      28    ml/m^2 ---------- LV e&', lateral                             7.4   cm/s   ---------- LV E/e&', lateral  8.65         ---------- LV e&', medial                              6.53  cm/s   ---------- LV E/e&', medial                            9.8          ---------- LV e&', average                             6.97  cm/s   ---------- LV E/e&', average                           9.19         ---------- Longitudinal strain, TDI                   19    %      ----------  Ventricular septum                         Value        Reference IVS thickness, ED                          12.4  mm      ----------  LVOT                                       Value        Reference LVOT ID, S                                 21    mm     ---------- LVOT area                                  3.46  cm^2   ---------- LVOT peak velocity, S                      97.5  cm/s   ---------- LVOT mean velocity, S                      59.2  cm/s   ---------- LVOT VTI, S                                19.7  cm     ----------  Aorta                                      Value        Reference Aortic root ID, ED                         29    mm     ----------  Left atrium  Value        Reference LA ID, A-P, ES                             43    mm     ---------- LA ID/bsa, A-P                             1.79  cm/m^2 <=2.2 LA volume, S                               51.9  ml     ---------- LA volume/bsa, S                           21.6  ml/m^2 ---------- LA volume, ES, 1-p A4C                     52.4  ml     ---------- LA volume/bsa, ES, 1-p A4C                 21.8  ml/m^2 ---------- LA volume, ES, 1-p A2C                     51.1  ml     ---------- LA volume/bsa, ES, 1-p A2C                 21.3  ml/m^2 ----------  Mitral valve                               Value        Reference Mitral E-wave peak velocity                64    cm/s   ---------- Mitral A-wave peak velocity                72.8  cm/s   ---------- Mitral deceleration time            (H)    246   ms     150 - 230 Mitral E/A ratio, peak                     0.9          ----------  Tricuspid valve                            Value        Reference Tricuspid regurg peak velocity             115   cm/s   ---------- Tricuspid peak RV-RA gradient              5     mm Hg  ---------- Tricuspid maximal regurg velocity,         115   cm/s   ---------- PISA  Right atrium                               Value        Reference RA ID, S-I, ES, A4C                 (  H)    49.3  mm     34 - 49 RA area, ES, A4C                            12.7  cm^2   8.3 - 19.5 RA volume, ES, A/L                         26.3  ml     ---------- RA volume/bsa, ES, A/L                     10.9  ml/m^2 ----------  Systemic veins                             Value        Reference Estimated CVP                              3     mm Hg  ----------  Right ventricle                            Value        Reference TAPSE                                      21.9  mm     ---------- RV pressure, S, DP                         8     mm Hg  <=30 RV s&', lateral, S                          10    cm/s   ----------  Legend: (L)  and  (H)  mark values outside specified reference range.  ------------------------------------------------------------------- Prepared and Electronically Authenticated by  Skeet Latch, MD 2019-03-11T12:45:11              Recent Labs: 03/13/2022: BUN 22; Creatinine, Ser 1.00; Hemoglobin 13.9; Platelets 230; Potassium 3.5; Sodium 128  Recent Lipid Panel    Component Value Date/Time   CHOL 157 12/09/2020 1046   TRIG 78 12/09/2020 1046   HDL 63 12/09/2020 1046   CHOLHDL 2.8 07/26/2019 1142   LDLCALC 79 12/09/2020 1046        Physical Exam:    VS:  BP 130/66   Pulse 76   Ht 5' 7"$  (1.702 m)   Wt 215 lb (97.5 kg)   BMI 33.67 kg/m     Wt Readings from Last 3 Encounters:  04/17/22 215 lb (97.5 kg)  04/13/22 213 lb (96.6 kg)  03/30/22 208 lb (94.3 kg)   GEN:  Well nourished, well developed in no acute distress HEENT: Normal NECK: No JVD CARDIAC: RRR, no murmurs, rubs, gallops RESPIRATORY:  Clear to auscultation without rales, wheezing or rhonchi  ABDOMEN: Soft, non-tender, non-distended MUSCULOSKELETAL:  No edema; No deformity  SKIN: Warm and dry NEUROLOGIC:  Alert and oriented x 3 PSYCHIATRIC:  Normal affect   ASSESSMENT:    1. Chronic diastolic heart failure ([place])   2. Chronic heart failure with preserved ejection fraction (  HFpEF) ([place])   3. Coronary artery disease involving native  coronary artery of native heart without angina pectoris   4. Right bundle branch block    PLAN:    CAD HLD - distant LCX intervention - no further ectopy after remote revascularization - has lost 50 lbs with health weight and wellness - needs labs from Dr. 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        Medication Adjustments/Labs and Tests Ordered: Current medicines are reviewed at length with the patient today.  Concerns regarding medicines are outlined above.  Orders Placed This Encounter  Procedures   ECHOCARDIOGRAM COMPLETE   No orders of the defined types were placed in this encounter.   Patient Instructions  Medication Instructions:  Your physician recommends that you continue on your current medications as directed. Please refer to the Current Medication list given to you today.  *If you need a refill on your cardiac medications before your next appointment, please call your pharmacy*   Lab Work: NONE If you have labs (blood work) drawn today and your tests are completely normal, you will receive your results only by: [place] (if you have MyChart) OR A paper copy in the mail If you have any lab test that is abnormal or we need to change your treatment, we will call you to review the results.   Testing/Procedures: Your physician has requested that you have an echocardiogram. Echocardiography is a painless test that uses sound waves to create images of your heart. It provides your doctor with  information about the size and shape of your heart and how well your heart's chambers and valves are working. This procedure takes approximately one hour. There are no restrictions for this procedure. Please do NOT wear cologne, perfume, aftershave, or lotions (deodorant is allowed). Please arrive 15 minutes prior to your appointment time.    Follow-Up: At [facility], you and your health needs are our priority.  As part of our continuing mission to provide you with exceptional heart care, we have created designated Provider Care Teams.  These Care Teams include your primary Cardiologist (physician) and Advanced Practice Providers (APPs -  Physician Assistants and Nurse Practitioners) who all work together to provide you with the care you need, when you need it.   Your next appointment:   1 year(s)  Provider:   Rudean Haskell, MD    Signed, Werner Lean, MD  04/17/2022 9:56 AM    [place]

## 2022-04-27 ENCOUNTER — Other Ambulatory Visit (HOSPITAL_COMMUNITY): Payer: Self-pay

## 2022-04-27 ENCOUNTER — Other Ambulatory Visit: Payer: Self-pay | Admitting: Interventional Cardiology

## 2022-04-27 ENCOUNTER — Other Ambulatory Visit: Payer: Self-pay | Admitting: Cardiology

## 2022-04-27 ENCOUNTER — Other Ambulatory Visit: Payer: Self-pay

## 2022-04-27 MED ORDER — CHLORTHALIDONE 25 MG PO TABS
12.5000 mg | ORAL_TABLET | Freq: Every day | ORAL | 2 refills | Status: DC
  Filled 2022-04-27: qty 45, 90d supply, fill #0
  Filled 2022-07-20: qty 45, 90d supply, fill #1
  Filled 2022-10-18: qty 45, 90d supply, fill #2
  Filled 2023-01-17: qty 45, 90d supply, fill #3

## 2022-04-27 MED ORDER — NITROGLYCERIN 0.4 MG SL SUBL
0.4000 mg | SUBLINGUAL_TABLET | SUBLINGUAL | 5 refills | Status: AC | PRN
  Filled 2022-04-27: qty 25, 10d supply, fill #0

## 2022-04-28 ENCOUNTER — Ambulatory Visit (INDEPENDENT_AMBULATORY_CARE_PROVIDER_SITE_OTHER): Payer: PPO | Admitting: Family Medicine

## 2022-04-28 ENCOUNTER — Encounter (INDEPENDENT_AMBULATORY_CARE_PROVIDER_SITE_OTHER): Payer: Self-pay | Admitting: Family Medicine

## 2022-04-28 VITALS — BP 126/64 | HR 67 | Temp 97.7°F | Ht 67.0 in | Wt 208.0 lb

## 2022-04-28 DIAGNOSIS — Z6832 Body mass index (BMI) 32.0-32.9, adult: Secondary | ICD-10-CM | POA: Diagnosis not present

## 2022-04-28 DIAGNOSIS — E669 Obesity, unspecified: Secondary | ICD-10-CM

## 2022-04-28 DIAGNOSIS — R7303 Prediabetes: Secondary | ICD-10-CM

## 2022-05-07 DIAGNOSIS — G4733 Obstructive sleep apnea (adult) (pediatric): Secondary | ICD-10-CM | POA: Diagnosis not present

## 2022-05-11 NOTE — Progress Notes (Unsigned)
Chief Complaint:   OBESITY Nate is here to discuss his progress with his obesity treatment plan along with follow-up of his obesity related diagnoses. Jubal is on the Category 2 Plan and states he is following his eating plan approximately 75% of the time. Johandry states he is doing 0 minutes 0 times per week.  Today's visit was #: 72 Starting weight: 258 lbs Starting date: 04/08/2020 Today's weight: 208 lbs Today's date: 04/28/2022 Total lbs lost to date: 50 Total lbs lost since last in-office visit: 0  Interim History: Jovian has done well with maintaining his weight loss over the last month.  He is working on meeting his protein goals.  He knows that he should increase his exercise.  Subjective:   1. Prediabetes Benedict is on Farxiga for his congestive heart failure.  He continues to work on his diet to improve his glucose and decrease the risk of diabetes mellitus.  No side effects were noted or signs hypoglycemia.  Assessment/Plan:   1. Prediabetes Jacayden will continue with his diet and add exercise as we discussed, and we will monitor his progress and follow-up in 1 month.  2. BMI 32.0-32.9,adult  3. Obesity, Beginning BMI 39.23 Azra is currently in the action stage of change. As such, his goal is to continue with weight loss efforts. He has agreed to the Category 2 Plan.   Exercise goals: [place] [employer] videos.   Behavioral modification strategies: increasing lean protein intake and increasing water intake in the AM.  Kabren has agreed to follow-up with our clinic in 4 weeks. He was informed of the importance of frequent follow-up visits to maximize his success with intensive lifestyle modifications for his multiple health conditions.   Objective:   Blood pressure 126/64, pulse 67, temperature 97.7 F (36.5 C), height '5\' 7"'$  (1.702 m), weight 208 lb (94.3 kg), SpO2 97 %. Body mass index is 32.58 kg/m.  Lab Results  Component Value Date   CREATININE 1.00 03/13/2022    BUN 22 03/13/2022   NA 128 (L) 03/13/2022   K 3.5 03/13/2022   CL 93 (L) 03/13/2022   CO2 24 03/13/2022   Lab Results  Component Value Date   ALT 13 12/09/2020   AST 22 12/09/2020   ALKPHOS 124 (H) 12/09/2020   BILITOT 0.6 12/09/2020   Lab Results  Component Value Date   HGBA1C 6.0 (H) 12/09/2020   HGBA1C 6.3 (H) 07/24/2020   HGBA1C 6.2 (H) 04/08/2020   HGBA1C 6.1 (H) 04/30/2013   Lab Results  Component Value Date   INSULIN 12.3 07/24/2020   INSULIN 5.3 04/08/2020   Lab Results  Component Value Date   TSH 1.01 03/27/2021   Lab Results  Component Value Date   CHOL 157 12/09/2020   HDL 63 12/09/2020   LDLCALC 79 12/09/2020   TRIG 78 12/09/2020   CHOLHDL 2.8 07/26/2019   Lab Results  Component Value Date   VD25OH 72.2 12/09/2020   VD25OH 53.6 07/24/2020   VD25OH 60.0 04/08/2020   Lab Results  Component Value Date   WBC 9.2 03/13/2022   HGB 13.9 03/13/2022   HCT 40.4 03/13/2022   MCV 85.4 03/13/2022   PLT 230 03/13/2022   No results found for: "IRON", "TIBC", "FERRITIN"  Attestation Statements:   Reviewed by clinician on day of visit: allergies, medications, problem list, medical history, surgical history, family history, social history, and previous encounter notes.  Time spent on visit including pre-visit chart review and post-visit care and  charting was 33 minutes.   I, Trixie Dredge, am acting as transcriptionist for Dennard Nip, MD.  I have reviewed the above documentation for accuracy and completeness, and I agree with the above. -  Dennard Nip, MD

## 2022-05-14 ENCOUNTER — Ambulatory Visit (HOSPITAL_COMMUNITY): Payer: PPO | Attending: Internal Medicine

## 2022-05-14 DIAGNOSIS — I5032 Chronic diastolic (congestive) heart failure: Secondary | ICD-10-CM | POA: Insufficient documentation

## 2022-05-14 LAB — ECHOCARDIOGRAM COMPLETE
Area-P 1/2: 4.77 cm2
S' Lateral: 3.8 cm

## 2022-05-25 ENCOUNTER — Encounter (INDEPENDENT_AMBULATORY_CARE_PROVIDER_SITE_OTHER): Payer: Self-pay | Admitting: Adult Health

## 2022-05-25 ENCOUNTER — Ambulatory Visit (INDEPENDENT_AMBULATORY_CARE_PROVIDER_SITE_OTHER): Payer: PPO | Admitting: Adult Health

## 2022-05-25 VITALS — BP 112/71 | HR 76 | Temp 98.0°F | Ht 67.0 in | Wt 208.0 lb

## 2022-05-25 DIAGNOSIS — R7303 Prediabetes: Secondary | ICD-10-CM

## 2022-05-25 DIAGNOSIS — E669 Obesity, unspecified: Secondary | ICD-10-CM | POA: Diagnosis not present

## 2022-05-25 DIAGNOSIS — Z6832 Body mass index (BMI) 32.0-32.9, adult: Secondary | ICD-10-CM

## 2022-05-25 DIAGNOSIS — I5032 Chronic diastolic (congestive) heart failure: Secondary | ICD-10-CM

## 2022-05-25 NOTE — Progress Notes (Signed)
WEIGHT SUMMARY AND BIOMETRICS  Vitals Temp: 77 F (36.7 C) BP: 112/71 Pulse Rate: 76 SpO2: 98 %   Anthropometric Measurements Height: 5\' 7"  (1.702 m) Weight: 208 lb (94.3 kg) BMI (Calculated): 32.57 Weight at Last Visit: 208lb Weight Lost Since Last Visit: 0   Body Composition  Body Fat %: 32.2 % Fat Mass (lbs): 67 lbs Muscle Mass (lbs): 134 lbs Total Body Water (lbs): 92.8 lbs Visceral Fat Rating : 21   Other Clinical Data Fasting: no Labs: no Today's Visit #: 28    Chief Complaint:   OBESITY Richard [name] is here to discuss his progress with his obesity treatment plan. He is on the the Category 2 Plan and states he is following his eating plan approximately 75 % of the time.  He states he is exercising golfing 2 times per week. He will walk the front 9 holes and ride in cart the back 9.   Interim History:  He provided the following food recall that is typical of day" Breakfast: 2 eggs, 2 slices of toast and sometimes either [place] bacon or sausage. Black coffee\ Lunch: Meat sandwich (regular sandwich meat), slice of cheese, serving of fruit. Dinner: Clinical biochemist, green vegetable.  He will travel to [facility] this week for golfing trip with 20 friends. They will be at [facility]- Sat, staying in a large suite. Breakfast provided at hotel, will eat out at seafood establishments in evening.   Of note- [facility] does not currently manage any Rx therapy for Richard [name]  Subjective:   1. Prediabetes Lab Results  Component Value Date   HGBA1C 6.0 (H) 12/09/2020   HGBA1C 6.3 (H) 07/24/2020   HGBA1C 6.2 (H) 04/08/2020   Currently on Farxiga 10mg  QD- managed by Cards (HF tx)  2. Chronic heart failure with preserved ejection fraction (HFpEF) ([place]) Cards OV 04/17/2022 Richard [name] is a 77 y.o. male with a hx of CAD with MI in 2004, LCX PCI in 2004 with ischemia and VF.  HLD, HTN, and OSA on CPAP.  Has Asthma.  Has OSA and sees Dr. Radford Pax. 2023: Started of  Farxiga for HFpEF and improvement in symptoms.   Patient notes that he is doing well.   Since last visit notes that he can gold and do ADLs  He is losing weight. In health weight and wellness and doing well. There are no interval hospital/ED visit.     No chest pain or pressure.   Chest discomfort and indigestion was his anginal equivalent. No SOB/DOE and no PND/Orthopnea. No palpitations or syncope. CAD HLD - distant LCX intervention - no further ectopy after remote revascularization - has lost 50 lbs with health weight and wellness - needs labs from Dr. 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 Prediabetes Continue Farxiga 10mg  QD and limit sugar/CHO intake  2. Chronic heart failure with preserved ejection fraction (HFpEF) ([facility]) Continue daily Farxiga 10mg  an other cardiac medications.  3. Obesity,current BMI 32.6  Richard [name] is currently in the action stage of change. As such, his goal is to continue with weight loss efforts. He has agreed to the Category 2 Plan.   Exercise goals: Older adults should follow the adult guidelines. When older adults cannot meet the adult guidelines, they should be as physically active as their abilities and conditions will allow.  Older adults should do exercises that maintain or improve balance if they are at risk of falling.  Older adults with chronic conditions should understand whether and how their conditions affect their ability to do regular physical activity safely.  Behavioral modification strategies:  increasing lean protein intake, decreasing simple carbohydrates, increasing vegetables, increasing water intake, and planning for success.  Richard [name] has agreed to follow-up with our clinic in 4 weeks. He was informed of the importance of frequent follow-up visits to maximize his success with intensive lifestyle modifications for his multiple health conditions.   Objective:   Blood pressure 112/71, pulse 76, temperature 98 F (36.7 C), height 5\' 7"  (1.702 m), weight 208 lb (94.3 kg), SpO2 98 %. Body mass index is 32.58 kg/m.  General: Cooperative, alert, well developed, in no acute distress. HEENT: Conjunctivae and lids unremarkable. Cardiovascular: Regular rhythm.  Lungs: Normal work of breathing. Neurologic: No focal deficits.   Lab Results  Component Value Date   CREATININE 1.00 03/13/2022   BUN 22 03/13/2022   NA 128 (L) 03/13/2022   K 3.5 03/13/2022   CL 93 (L) 03/13/2022   CO2 24 03/13/2022   Lab Results  Component Value Date   ALT 13 12/09/2020   AST 22 12/09/2020   ALKPHOS 124 (H) 12/09/2020   BILITOT 0.6 12/09/2020   Lab Results  Component Value Date   HGBA1C 6.0 (H) 12/09/2020   HGBA1C 6.3 (H) 07/24/2020   HGBA1C 6.2 (H) 04/08/2020   HGBA1C 6.1 (H) 04/30/2013   Lab Results  Component Value Date   INSULIN 12.3 07/24/2020   INSULIN 5.3 04/08/2020   Lab Results  Component Value Date   TSH 1.01 03/27/2021   Lab Results  Component Value Date   CHOL 157 12/09/2020   HDL 63 12/09/2020   LDLCALC 79 12/09/2020   TRIG 78 12/09/2020   CHOLHDL 2.8 07/26/2019   Lab Results  Component Value Date   VD25OH 72.2 12/09/2020   VD25OH 53.6 07/24/2020   VD25OH 60.0 04/08/2020   Lab Results  Component Value Date   WBC 9.2 03/13/2022   HGB 13.9 03/13/2022   HCT 40.4 03/13/2022   MCV 85.4 03/13/2022   PLT 230 03/13/2022   No results found for: "IRON", "TIBC", "FERRITIN"  Attestation Statements:   Reviewed by clinician on day of visit: allergies, medications,  problem list, medical history, surgical history, family history, social history, and previous encounter notes.  Time spent on visit including pre-visit chart review and post-visit care and charting was 27 minutes.   I have reviewed the above documentation for accuracy and completeness, and I agree with the above. -  Anothony Bursch d. Pasco Marchitto, NP-C

## 2022-05-26 ENCOUNTER — Ambulatory Visit (INDEPENDENT_AMBULATORY_CARE_PROVIDER_SITE_OTHER): Payer: PPO | Admitting: Adult Health

## 2022-06-09 ENCOUNTER — Other Ambulatory Visit: Payer: Self-pay

## 2022-06-12 ENCOUNTER — Telehealth: Payer: Self-pay | Admitting: Internal Medicine

## 2022-06-12 NOTE — Telephone Encounter (Signed)
Spoke with patient and shared echo results with him. Patient verbalized understanding and expressed appreciation for call.

## 2022-06-12 NOTE — Telephone Encounter (Signed)
Left message to call the clinic. 

## 2022-06-12 NOTE — Telephone Encounter (Signed)
Patient returned RN's call and noted he was having problems with his mobile number and requested a call back to his home number - [phone].

## 2022-06-12 NOTE — Telephone Encounter (Signed)
Patient is returning call in regards to recent results he received a letter for. He is requesting return call.

## 2022-06-12 NOTE — Telephone Encounter (Signed)
Left detailed message (OK per DPR) for patient informing him his echocardiogram results from 05/14/22 were within normal limits, no changes to current treatment plan.  Provided office number for callback if any further questions.

## 2022-06-12 NOTE — Telephone Encounter (Signed)
  Patient is returning call for lab results 

## 2022-06-13 ENCOUNTER — Other Ambulatory Visit: Payer: Self-pay | Admitting: Cardiology

## 2022-06-15 ENCOUNTER — Other Ambulatory Visit (HOSPITAL_COMMUNITY): Payer: Self-pay

## 2022-06-15 MED ORDER — DAPAGLIFLOZIN PROPANEDIOL 10 MG PO TABS
10.0000 mg | ORAL_TABLET | Freq: Every day | ORAL | 2 refills | Status: DC
  Filled 2022-06-15: qty 90, 90d supply, fill #0
  Filled 2022-09-14: qty 30, 30d supply, fill #1
  Filled 2022-09-14 (×2): qty 90, 90d supply, fill #1
  Filled 2022-09-16: qty 30, 30d supply, fill #1
  Filled 2022-10-18: qty 30, 30d supply, fill #2
  Filled 2022-11-16: qty 30, 30d supply, fill #3
  Filled 2022-12-14: qty 30, 30d supply, fill #4
  Filled 2023-01-17: qty 30, 30d supply, fill #5
  Filled 2023-02-15: qty 30, 30d supply, fill #6

## 2022-06-15 NOTE — Telephone Encounter (Signed)
Rx refill sent to pharmacy. 

## 2022-06-17 ENCOUNTER — Other Ambulatory Visit (HOSPITAL_COMMUNITY): Payer: Self-pay

## 2022-06-18 ENCOUNTER — Other Ambulatory Visit (HOSPITAL_COMMUNITY): Payer: Self-pay

## 2022-06-23 ENCOUNTER — Encounter (INDEPENDENT_AMBULATORY_CARE_PROVIDER_SITE_OTHER): Payer: Self-pay | Admitting: Adult Health

## 2022-06-23 ENCOUNTER — Ambulatory Visit (INDEPENDENT_AMBULATORY_CARE_PROVIDER_SITE_OTHER): Payer: PPO | Admitting: Adult Health

## 2022-06-23 VITALS — BP 121/73 | HR 69 | Temp 98.2°F | Ht 67.0 in | Wt 206.0 lb

## 2022-06-23 DIAGNOSIS — E669 Obesity, unspecified: Secondary | ICD-10-CM | POA: Diagnosis not present

## 2022-06-23 DIAGNOSIS — Z6832 Body mass index (BMI) 32.0-32.9, adult: Secondary | ICD-10-CM | POA: Diagnosis not present

## 2022-06-23 DIAGNOSIS — R7303 Prediabetes: Secondary | ICD-10-CM

## 2022-06-23 DIAGNOSIS — G4733 Obstructive sleep apnea (adult) (pediatric): Secondary | ICD-10-CM | POA: Diagnosis not present

## 2022-06-23 DIAGNOSIS — I10 Essential (primary) hypertension: Secondary | ICD-10-CM

## 2022-06-23 NOTE — Progress Notes (Signed)
WEIGHT SUMMARY AND BIOMETRICS  Vitals Temp: 98.2 F (36.8 C) BP: 121/73 Pulse Rate: 69 SpO2: 99 %   Anthropometric Measurements Height:  (1.702 m) Weight: 206 lb (93.4 kg) BMI (Calculated): 32.26 Weight at Last Visit: 208lb Weight Lost Since Last Visit: 2b Weight Gained Since Last Visit: 0 Starting Weight: 258lb Total Weight Loss (lbs): 52 lb (23.6 kg)   Body Composition  Body Fat %: 31.8 % Fat Mass (lbs): 65.6 lbs Muscle Mass (lbs): 134 lbs Total Body Water (lbs): 93.4 lbs Visceral Fat Rating : 20   Other Clinical Data Fasting: No Labs: No Today's Visit #: 3 Starting Date: 04/08/20    Chief Complaint:   OBESITY Richard [name] is here to discuss his progress with his obesity treatment plan. He is on the the Category 2 Plan and states he is following his eating plan approximately 75 % of the time.  He states he is exercising GOLF minutes 2-3 Rounds- 18 holes   Interim History:  As the weather has improved, Richard [name] has increased golf play. He will play 18 holes- walk the front 9 and ride cart for the back 9. When playing at his peak he will play 3 rounds a 18 over a week span.  Reviewed Bioempednce results with pt: Muscle Mass: No Change Adipose Mass: + 1.4 lbs  He rarely snacks, if he does, he will choose fruits or vegetables.  Subjective:   1. OSA treated with BiPAP 04/2021 Dr. Kym Groom OV: Richard [name] is a 77 y.o. male with a hx of Obesity, snoring and witnessed Apnea.  He was referred for sleep study which showed mild OSA with an AHI of 13.5/hr and O2 sats of 88% with events.  He underwent BiPAP titration to 13/9cm H2O.     He is doing well with his PAP device and thinks that he has gotten used to it.  He tolerates the mask and feels the pressure is adequate. He has never really felt and difference in how rested he feels in the am after starting PAP therapy but does not have to take any naps. He committed denies any significant mouth  or nasal dryness or nasal congestion.  He does not think that he snores.     OSA - The patient is tolerating PAP therapy well without any problems. The PAP download performed by his DME was personally reviewed and interpreted by me today and showed an AHI of 1.6 /hr on 13/9 cm H2O with 87% compliance in using more than 4 hours nightly.  The patient has been using and benefiting from PAP use and will continue to benefit from therapy.   2. Essential hypertension BP at excellent at OV. He is on: aspirin EC 81 MG tablet  atorvastatin (LIPITOR) 80 MG tablet  amLODipine (NORVASC) 5 MG tablet  ezetimibe (ZETIA) 10 MG tablet  carvedilol (COREG) 12.5 MG tablet  chlorthalidone (HYGROTON) 25 MG tablet  nitroGLYCERIN (NITROSTAT) 0.4 MG SL tablet  He denies CP or dyspnea with exertion.  3. Prediabetes Lab Results  Component Value Date   HGBA1C 6.0 (H) 12/09/2020   HGBA1C 6.3 (H) 07/24/2020   HGBA1C 6.2 (H) 04/08/2020   He is not currently on any antidiabetic medications He denies polyphagia and prefers to snack on fruits and vegetables.  Assessment/Plan:   1. OSA treated with BiPAP Continue BiPAP per Dr. Mayford Knife.  2. Essential hypertension Continue current antihypertensive regime  3. Prediabetes Continue weight loss efforts.  4. Obesity,current BMI  32.26  Ab is currently in the action stage of change. As such, his goal is to continue with weight loss efforts. He has agreed to the Category 2 Plan.   Exercise goals: Older adults should follow the adult guidelines. When older adults cannot meet the adult guidelines, they should be as physically active as their abilities and conditions will allow.  Older adults should do exercises that maintain or improve balance if they are at risk of falling.  Older adults should determine their level of effort for physical activity relative to their level of fitness.  Older adults with chronic conditions should understand whether and how their conditions  affect their ability to do regular physical activity safely.  Behavioral modification strategies: increasing lean protein intake, decreasing simple carbohydrates, increasing vegetables, increasing water intake, no skipping meals, meal planning and cooking strategies, and planning for success.  Richard [name] has agreed to follow-up with our clinic in 4 weeks. He was informed of the importance of frequent follow-up visits to maximize his success with intensive lifestyle modifications for his multiple health conditions.   Objective:   Blood pressure 121/73, pulse 69, temperature 98.2 F (36.8 C), height  (1.702 m), weight 206 lb (93.4 kg), SpO2 99 %. Body mass index is 32.26 kg/m.  General: Cooperative, alert, well developed, in no acute distress. HEENT: Conjunctivae and lids unremarkable. Cardiovascular: Regular rhythm.  Lungs: Normal work of breathing. Neurologic: No focal deficits.   Lab Results  Component Value Date   CREATININE 1.00 03/13/2022   BUN 22 03/13/2022   NA 128 (L) 03/13/2022   K 3.5 03/13/2022   CL 93 (L) 03/13/2022   CO2 24 03/13/2022   Lab Results  Component Value Date   ALT 13 12/09/2020   AST 22 12/09/2020   ALKPHOS 124 (H) 12/09/2020   BILITOT 0.6 12/09/2020   Lab Results  Component Value Date   HGBA1C 6.0 (H) 12/09/2020   HGBA1C 6.3 (H) 07/24/2020   HGBA1C 6.2 (H) 04/08/2020   HGBA1C 6.1 (H) 04/30/2013   Lab Results  Component Value Date   INSULIN 12.3 07/24/2020   INSULIN 5.3 04/08/2020   Lab Results  Component Value Date   TSH 1.01 03/27/2021   Lab Results  Component Value Date   CHOL 157 12/09/2020   HDL 63 12/09/2020   LDLCALC 79 12/09/2020   TRIG 78 12/09/2020   CHOLHDL 2.8 07/26/2019   Lab Results  Component Value Date   VD25OH 72.2 12/09/2020   VD25OH 53.6 07/24/2020   VD25OH 60.0 04/08/2020   Lab Results  Component Value Date   WBC 9.2 03/13/2022   HGB 13.9 03/13/2022   HCT 40.4 03/13/2022   MCV 85.4 03/13/2022   PLT 230  03/13/2022   No results found for: "IRON", "TIBC", "FERRITIN"  Attestation Statements:   Reviewed by clinician on day of visit: allergies, medications, problem list, medical history, surgical history, family history, social history, and previous encounter notes.  Time spent on visit including pre-visit chart review and post-visit care and charting was 27 minutes.   I have reviewed the above documentation for accuracy and completeness, and I agree with the above. -  Henlee Donovan d. Alia Parsley, NP-C

## 2022-07-05 ENCOUNTER — Other Ambulatory Visit: Payer: Self-pay | Admitting: Internal Medicine

## 2022-07-06 ENCOUNTER — Other Ambulatory Visit (HOSPITAL_COMMUNITY): Payer: Self-pay

## 2022-07-06 MED ORDER — CARVEDILOL 12.5 MG PO TABS
12.5000 mg | ORAL_TABLET | Freq: Two times a day (BID) | ORAL | 3 refills | Status: DC
  Filled 2022-07-06: qty 180, 90d supply, fill #0
  Filled 2022-10-18: qty 180, 90d supply, fill #1
  Filled 2023-01-17: qty 180, 90d supply, fill #2
  Filled 2023-04-16: qty 180, 90d supply, fill #3

## 2022-07-07 ENCOUNTER — Other Ambulatory Visit (HOSPITAL_COMMUNITY): Payer: Self-pay

## 2022-07-07 DIAGNOSIS — I119 Hypertensive heart disease without heart failure: Secondary | ICD-10-CM | POA: Diagnosis not present

## 2022-07-07 DIAGNOSIS — E559 Vitamin D deficiency, unspecified: Secondary | ICD-10-CM | POA: Diagnosis not present

## 2022-07-07 DIAGNOSIS — Z79899 Other long term (current) drug therapy: Secondary | ICD-10-CM | POA: Diagnosis not present

## 2022-07-07 DIAGNOSIS — E876 Hypokalemia: Secondary | ICD-10-CM | POA: Diagnosis not present

## 2022-07-07 DIAGNOSIS — E669 Obesity, unspecified: Secondary | ICD-10-CM | POA: Diagnosis not present

## 2022-07-07 DIAGNOSIS — I5030 Unspecified diastolic (congestive) heart failure: Secondary | ICD-10-CM | POA: Diagnosis not present

## 2022-07-07 DIAGNOSIS — R7302 Impaired glucose tolerance (oral): Secondary | ICD-10-CM | POA: Diagnosis not present

## 2022-07-07 DIAGNOSIS — I2510 Atherosclerotic heart disease of native coronary artery without angina pectoris: Secondary | ICD-10-CM | POA: Diagnosis not present

## 2022-07-07 DIAGNOSIS — J4530 Mild persistent asthma, uncomplicated: Secondary | ICD-10-CM | POA: Diagnosis not present

## 2022-07-07 DIAGNOSIS — E7800 Pure hypercholesterolemia, unspecified: Secondary | ICD-10-CM | POA: Diagnosis not present

## 2022-07-07 DIAGNOSIS — E049 Nontoxic goiter, unspecified: Secondary | ICD-10-CM | POA: Diagnosis not present

## 2022-07-07 DIAGNOSIS — J441 Chronic obstructive pulmonary disease with (acute) exacerbation: Secondary | ICD-10-CM | POA: Diagnosis not present

## 2022-07-07 MED ORDER — OMEPRAZOLE 40 MG PO CPDR
40.0000 mg | DELAYED_RELEASE_CAPSULE | Freq: Two times a day (BID) | ORAL | 3 refills | Status: DC
  Filled 2022-07-07: qty 90, 45d supply, fill #0
  Filled 2022-10-18: qty 90, 45d supply, fill #1
  Filled 2023-01-17: qty 90, 45d supply, fill #2
  Filled 2023-04-16: qty 90, 45d supply, fill #3

## 2022-07-21 ENCOUNTER — Ambulatory Visit (INDEPENDENT_AMBULATORY_CARE_PROVIDER_SITE_OTHER): Payer: PPO | Admitting: Adult Health

## 2022-07-21 ENCOUNTER — Encounter (INDEPENDENT_AMBULATORY_CARE_PROVIDER_SITE_OTHER): Payer: Self-pay | Admitting: Adult Health

## 2022-07-21 VITALS — BP 117/65 | HR 78 | Temp 98.0°F | Ht 67.0 in | Wt 210.0 lb

## 2022-07-21 DIAGNOSIS — Z6839 Body mass index (BMI) 39.0-39.9, adult: Secondary | ICD-10-CM

## 2022-07-21 DIAGNOSIS — I10 Essential (primary) hypertension: Secondary | ICD-10-CM | POA: Diagnosis not present

## 2022-07-21 DIAGNOSIS — E669 Obesity, unspecified: Secondary | ICD-10-CM | POA: Diagnosis not present

## 2022-07-21 DIAGNOSIS — I5032 Chronic diastolic (congestive) heart failure: Secondary | ICD-10-CM

## 2022-07-21 DIAGNOSIS — Z6832 Body mass index (BMI) 32.0-32.9, adult: Secondary | ICD-10-CM | POA: Diagnosis not present

## 2022-07-21 NOTE — Progress Notes (Signed)
WEIGHT SUMMARY AND BIOMETRICS  Vitals Temp: 98 F (36.7 C) BP: 117/65 Pulse Rate: 78 SpO2: 96 %   Anthropometric Measurements Height: 5\' 7"  (1.702 m) Weight: 210 lb (95.3 kg) BMI (Calculated): 32.88 Weight at Last Visit: 206lb Weight Lost Since Last Visit: 0 Weight Gained Since Last Visit: 4lb Starting Weight: 258lb Total Weight Loss (lbs): 48 lb (21.8 kg)   Body Composition  Body Fat %: 32.2 % Fat Mass (lbs): 67.8 lbs Muscle Mass (lbs): 135.6 lbs Total Body Water (lbs): 93.4 lbs Visceral Fat Rating : 21   Other Clinical Data Fasting: no Labs: no Today's Visit #: 30 Starting Date: 04/08/20    Chief Complaint:   OBESITY Richard [name] is here to discuss his progress with his obesity treatment plan. He is on the the Category 2 Plan and states he is following his eating plan approximately 70 % of the time. He states he is exercising Golfing 4 hours- several times per week.   Interim History:  Richard [name] reports eating of plan at least 30% the last several weeks while attending High School and [place] graduation celebrations. He has been out golfing more frequently- does not track steps or calorie expenditures. He will experience muscle soreness the first few weeks when he get back on the links.  Hunger/appetite-he endorses stable appetite levels.  Stress- he denies increase in stress or anxiety levels.  He is happily married and retired. Sleep- he estimates to sleep 7 solid hours/night. Exercise-golfing- cart/walking course. Hydration-he estimates to drink 2-3 24 oz cups of water.  Reviewed Bioempedence results with pt: Muscle Mass: +1.6 lbs Adipose Mass: + 2.2 lbs  Subjective:   1. Chronic heart failure with preserved ejection fraction (HFpEF) ([facility]) 04/17/2022 Cards OV Notes: Richard [name] is a 77 y.o. male with a hx of CAD with MI in 2004, LCX PCI in 2004 with ischemia and VF.  HLD, HTN, and OSA on CPAP.  Has Asthma.  Has OSA and sees Dr.  Mayford [name]. 2023: Started of Farxiga for HFpEF and improvement in symptoms.   Patient notes that he is doing well.   Since last visit notes that he can gold and do ADLs  He is losing weight. In health weight and wellness and doing well. There are no interval hospital/ED visit.     No chest pain or pressure.   Chest discomfort and indigestion was his anginal equivalent. No SOB/DOE and no PND/Orthopnea. No palpitations or syncope. 05/14/2022 ECHO 1. Left ventricular ejection fraction, by estimation, is 55 to 60%.  Left  ventricular ejection fraction by 3D volume is 58 %. The left ventricle has  normal function. The left ventricle has no regional wall motion  abnormalities. Left ventricular diastolic   parameters are consistent with Grade I diastolic dysfunction (impaired  relaxation).   2. Right ventricular systolic function is normal. The right ventricular  size is normal. There is normal pulmonary artery systolic pressure. The  estimated right ventricular systolic pressure is 16.5 mmHg.   3. The mitral valve is grossly normal. Trivial mitral valve  regurgitation.   4. The aortic valve is tricuspid. Aortic valve regurgitation is not  visualized.   5. The inferior vena cava is normal in size with greater than 50%  respiratory variability, suggesting right atrial pressure of 3 mmHg.   Comparison(s):  Prior images unable to be directly viewed, comparison made  by report only. No significant change from prior study. 05/17/2017: LVEF  55-60%.   2. Essential hypertension  BP at goal at OV. He reports ambulatory readings. SBP 120-130s DBP 70s He is currently on: aspirin EC 81 MG tablet  atorvastatin (LIPITOR) 80 MG tablet  amLODipine (NORVASC) 5 MG tablet  ezetimibe (ZETIA) 10 MG tablet  chlorthalidone (HYGROTON) 25 MG tablet  nitroGLYCERIN (NITROSTAT) 0.4 MG SL tablet  carvedilol (COREG) 12.5 MG tablet     Assessment/Plan:   1. Chronic heart failure with preserved ejection  fraction (HFpEF) ([facility]) Continue with weight loss efforts. Continue daily Farxiga 10mg  F/u with Cards as directed  2. Essential hypertension Continue healthy eating and regular activity.  3. Obesity,current BMI 32.88  Richard [name] is currently in the action stage of change. As such, his goal is to continue with weight loss efforts. He has agreed to the Category 2 Plan.   Exercise goals: Older adults should follow the adult guidelines. When older adults cannot meet the adult guidelines, they should be as physically active as their abilities and conditions will allow.  Older adults should do exercises that maintain or improve balance if they are at risk of falling.  Older adults with chronic conditions should understand whether and how their conditions affect their ability to do regular physical activity safely.  Behavioral modification strategies: increasing lean protein intake, decreasing simple carbohydrates, increasing vegetables, increasing water intake, meal planning and cooking strategies, and better snacking choices.  Richard [name] has agreed to follow-up with our clinic in 4 weeks. He was informed of the importance of frequent follow-up visits to maximize his success with intensive lifestyle modifications for his multiple health conditions.   Objective:   Blood pressure 117/65, pulse 78, temperature 98 F (36.7 C), height 5\' 7"  (1.702 m), weight 210 lb (95.3 kg), SpO2 96 %. Body mass index is 32.89 kg/m.  General: Cooperative, alert, well developed, in no acute distress. HEENT: Conjunctivae and lids unremarkable. Cardiovascular: Regular rhythm.  Lungs: Normal work of breathing. Neurologic: No focal deficits.   Lab Results  Component Value Date   CREATININE 1.00 03/13/2022   BUN 22 03/13/2022   NA 128 (L) 03/13/2022   K 3.5 03/13/2022   CL 93 (L) 03/13/2022   CO2 24 03/13/2022   Lab Results  Component Value Date   ALT 13 12/09/2020   AST 22 12/09/2020   ALKPHOS 124 (H) 12/09/2020    BILITOT 0.6 12/09/2020   Lab Results  Component Value Date   HGBA1C 6.0 (H) 12/09/2020   HGBA1C 6.3 (H) 07/24/2020   HGBA1C 6.2 (H) 04/08/2020   HGBA1C 6.1 (H) 04/30/2013   Lab Results  Component Value Date   INSULIN 12.3 07/24/2020   INSULIN 5.3 04/08/2020   Lab Results  Component Value Date   TSH 1.01 03/27/2021   Lab Results  Component Value Date   CHOL 157 12/09/2020   HDL 63 12/09/2020   LDLCALC 79 12/09/2020   TRIG 78 12/09/2020   CHOLHDL 2.8 07/26/2019   Lab Results  Component Value Date   VD25OH 72.2 12/09/2020   VD25OH 53.6 07/24/2020   VD25OH 60.0 04/08/2020   Lab Results  Component Value Date   WBC 9.2 03/13/2022   HGB 13.9 03/13/2022   HCT 40.4 03/13/2022   MCV 85.4 03/13/2022   PLT 230 03/13/2022   No results found for: "IRON", "TIBC", "FERRITIN"  Attestation Statements:   Reviewed by clinician on day of visit: allergies, medications, problem list, medical history, surgical history, family history, social history, and previous encounter notes.  Time spent on visit including pre-visit chart review and post-visit care  and charting was 27 minutes.   I have reviewed the above documentation for accuracy and completeness, and I agree with the above. -  Ettamae Barkett d. Irisha Grandmaison, NP-C

## 2022-08-11 ENCOUNTER — Other Ambulatory Visit: Payer: Self-pay

## 2022-08-11 ENCOUNTER — Ambulatory Visit
Admission: EM | Admit: 2022-08-11 | Discharge: 2022-08-11 | Disposition: A | Discharge status: Home / Self Care | Payer: PPO | Attending: Family Medicine | Admitting: Family Medicine

## 2022-08-11 ENCOUNTER — Encounter: Payer: Self-pay | Admitting: Emergency Medicine

## 2022-08-11 DIAGNOSIS — H1031 Unspecified acute conjunctivitis, right eye: Secondary | ICD-10-CM | POA: Diagnosis not present

## 2022-08-11 MED ORDER — GENTAMICIN SULFATE 0.3 % OP SOLN: 2.0000 [drp] | Freq: Three times a day (TID) | OPHTHALMIC | 0 refills | Status: AC

## 2022-08-11 NOTE — ED Provider Notes (Signed)
[facility]    CSN: [id number] Arrival date & time: 08/11/22  4098      History   Chief Complaint Chief Complaint  Patient presents with   Conjunctivitis    HPI Richard [name] is a 77 y.o. male.    Conjunctivitis   Here for right eye redness, irritation and drainage.  No fever.  Has had a very slight cough.  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 Katrinka Blazing, at [facility], yearly visits   OSA treated with BiPAP 04/24/2018   Mild OSA with an AHI of 13.5/hr and O2 sats of 88%. Now on BiPAP at 13/9cm H2O.    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   Prediabetes 04/09/2020   Pure hypercholesterolemia    Right bundle branch block 06/27/2014    EKG performed 06/27/2014   Vitamin D deficiency     Patient Active Problem List   Diagnosis Date Noted   BMI 32.0-32.9,adult 04/28/2022   Obesity, Beginning BMI 39.23 04/28/2022   Chronic heart failure with preserved ejection fraction (HFpEF) (HCC) 04/17/2022   SOBOE (shortness of breath on exertion) 01/06/2022   Other fatigue 01/06/2022   Transient total loss of muscle tone  01/06/2022   Essential hypertension 12/02/2021   Pre-diabetes 12/02/2021   Mild cognitive impairment of uncertain or unknown etiology 09/30/2021   Acid reflux disease 09/29/2021   Heart failure, diastolic 09/29/2021   Benign essential hypertension    Internal hemorrhoids    Personal history of colonic polyps    Pure hypercholesterolemia    Class 2 severe obesity with serious comorbidity and body mass index (BMI) of 39.0 to 39.9 in adult 06/04/2020   COPD, 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   Prediabetes 04/09/2020   OSA treated with BiPAP 04/24/2018   Right bundle branch block 06/27/2014   Hyperlipidemia 06/26/2013   Influenza due to identified novel influenza A virus with other respiratory manifestations 05/01/2013   CAP (community acquired pneumonia) 04/30/2013   Atherosclerosis of coronary artery 04/30/2013   Asthma 04/30/2013   Hypoxia 04/30/2013   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    Class: Chronic   Myocardial infarction 2004    Past Surgical History:  Procedure Laterality Date   CARDIAC CATHETERIZATION  2004   with 2 stents   CARDIOVASCULAR STRESS TEST  2011   COLONOSCOPY     POLYPECTOMY  02/17/2011   Procedure: POLYPECTOMY NASAL;  Surgeon: Barbee Cough;  Location: [facility] OR;  Service: ENT;  Laterality: N/A;   SINUS ENDO W/FUSION  02/17/2011   Procedure: ENDOSCOPIC SINUS SURGERY WITH FUSION NAVIGATION;  Surgeon: Barbee Cough;  Location: [facility] OR;  Service: ENT;  Laterality: N/A;   TONSILLECTOMY     US ECHOCARDIOGRAPHY         Home Medications    Prior to Admission medications   Medication Sig Start Date End Date Taking? Authorizing Provider  gentamicin (GARAMYCIN) 0.3 % ophthalmic solution Place 2 drops into the right eye 3 (three) times daily for 5 days. 08/11/22 08/16/22 Yes Zenia Resides, MD  amLODipine (NORVASC) 5 MG tablet Take 1 tablet (5 mg total) by mouth daily. 01/01/22     aspirin EC 81 MG tablet Take 1 tablet (81 mg total) by mouth daily. 04/22/17    Berton Bon, NP  atorvastatin (LIPITOR) 80 MG tablet Take 1 tablet (80 mg total) by mouth at bedtime. 12/15/21     carvedilol (COREG) 12.5 MG tablet Take 1 tablet (12.5 mg total) by mouth 2 (two) times daily. 07/06/22   Christell Constant, MD  chlorthalidone (HYGROTON) 25 MG tablet Take 1/2 tablet (12.5 mg total) by mouth daily. 04/27/22   Quintella Reichert, MD  COVID-19 mRNA Vac-TriS, Pfizer, (PFIZER-BIONT COVID-19 VAC-TRIS) SUSP injection Inject into the muscle. 09/24/20   Judyann Munson, MD  dapagliflozin propanediol (FARXIGA) 10 MG TABS tablet Take 1 tablet (10 mg total) by mouth daily before breakfast. 06/15/22   Quintella Reichert, MD  ergocalciferol (VITAMIN D2) 1.25 MG (50000 UT) capsule Take 1 capsule (50,000 Units total) by mouth once a week. 11/17/21     ezetimibe (ZETIA) 10 MG tablet Take 1 tablet (10 mg total) by mouth daily. 02/05/22     fluticasone furoate-vilanterol (BREO ELLIPTA) 200-25 MCG/ACT AEPB Inhale 1 puff into the lungs daily. 09/29/21     guaiFENesin (MUCINEX) 600 MG 12 hr tablet Take by mouth 2 (two) times daily.    [provider]  ipratropium-albuterol (DUONEB) 0.5-2.5 (3) MG/3ML SOLN Inhale 3 mLs by nebulization 2 (two) times daily and once at bedtime as needed. 04/07/22   Corine Shelter, MD  montelukast (SINGULAIR) 10 MG tablet Take 1 tablet (10 mg total) by mouth daily. 09/29/21     nitroGLYCERIN (NITROSTAT) 0.4 MG SL tablet Place 1 tablet (0.4 mg total) under the tongue every 5 (five) minutes as needed. For chest pain 04/27/22   Quintella Reichert, MD  omeprazole (PRILOSEC) 40 MG capsule Take 1 capsule (40 mg total) by mouth 2 (two) times daily. 01/16/21     omeprazole (PRILOSEC) 40 MG capsule Take 1 capsule (40 mg total) by mouth 2 (two) times daily. 07/07/22     polyethylene glycol powder (GLYCOLAX/MIRALAX) 17 GM/SCOOP powder See admin instructions.    [provider]  polyethylene glycol-electrolytes (NULYTELY) 420 g solution Use as directed 03/21/21      potassium chloride (MICRO-K) 10 MEQ CR capsule Take 1 capsule (10 mEq total) by mouth daily. 12/15/21     tamsulosin (FLOMAX) 0.4 MG CAPS capsule Take 1 capsule (0.4 mg total) by mouth daily. 04/07/22   Corine Shelter, MD    Family History Family History  Problem Relation Age of Onset   Cancer Mother    Alzheimer's disease Mother    Stroke Mother    Heart attack Father    Diabetes Father    Hypertension Father    Hypertension Sister    Diabetes Sister    Alzheimer's disease Maternal Aunt        total of 4 maternal aunts with AD   Alzheimer's disease Cousin    Alzheimer's disease Maternal Uncle     Social History Social History  Tobacco Use   Smoking status: Former   Smokeless tobacco: Never  Vaping Use   Vaping Use: Never used  Substance Use Topics   Alcohol use: Not Currently    Comment: infrequent   Drug use: No     Allergies   Beef-derived products, Chicken protein, Lisinopril, and Peanut-containing drug products   Review of Systems Review of Systems   Physical Exam Triage Vital Signs ED Triage Vitals  Enc Vitals Group     BP 08/11/22 0827 122/70     Pulse Rate 08/11/22 0827 83     Resp 08/11/22 0827 18     Temp 08/11/22 0827 98.6 F (37 C)     Temp Source 08/11/22 0827 Oral     SpO2 08/11/22 0827 96 %     Weight --      Height --      Head Circumference --      Peak Flow --      Pain Score 08/11/22 0828 0     Pain Loc --      Pain Edu? --      Excl. in GC? --    No data found.  Updated Vital Signs BP 122/70 (BP Location: Left Arm)   Pulse 83   Temp 98.6 F (37 C) (Oral)   Resp 18   SpO2 96%   Visual Acuity Right Eye Distance:   Left Eye Distance:   Bilateral Distance:    Right Eye Near:   Left Eye Near:    Bilateral Near:     Physical Exam Vitals reviewed.  Constitutional:      General: He is not in acute distress.    Appearance: He is not ill-appearing, toxic-appearing or diaphoretic.  HENT:     Mouth/Throat:      Mouth: Mucous membranes are moist.     Pharynx: No oropharyngeal exudate or posterior oropharyngeal erythema.  Eyes:     Extraocular Movements: Extraocular movements intact.     Pupils: Pupils are equal, round, and reactive to light.     Comments: Right conjunctiva is injected.  The lids are not swollen.  There is no discharge on the lids.  Left conjunctiva is normal  Skin:    Coloration: Skin is not pale.  Neurological:     Mental Status: He is alert and oriented to person, place, and time.  Psychiatric:        Behavior: Behavior normal.      UC Treatments / Results  Labs (all labs ordered are listed, but only abnormal results are displayed) Labs Reviewed - No data to display  EKG   Radiology No results found.  Procedures Procedures (including critical care time)  Medications Ordered in UC Medications - No data to display  Initial Impression / Assessment and Plan / UC Course  I have reviewed the triage vital signs and the nursing notes.  Pertinent labs & imaging results that were available during my care of the patient were reviewed by me and considered in my medical decision making (see chart for details).        Gentamicin is sent in to treat the conjunctivitis. Final Clinical Impressions(s) / UC Diagnoses   Final diagnoses:  Acute conjunctivitis of right eye, unspecified acute conjunctivitis type     Discharge Instructions      Put gentamicin eyedrops in the affected eye(s) 3 times daily for 5 days.  Cool compresses can help how that I feels     ED Prescriptions  Medication Sig Dispense Auth. Provider   gentamicin (GARAMYCIN) 0.3 % ophthalmic solution Place 2 drops into the right eye 3 (three) times daily for 5 days. 5 mL Zenia Resides, MD      PDMP not reviewed this encounter.   Zenia Resides, MD 08/11/22 [phone]

## 2022-08-11 NOTE — Discharge Instructions (Signed)
Put gentamicin eyedrops in the affected eye(s) 3 times daily for 5 days.  Cool compresses can help how that I feels

## 2022-08-11 NOTE — ED Triage Notes (Signed)
Pt here for right eye redness and irritation x 2 days with watering

## 2022-08-12 ENCOUNTER — Other Ambulatory Visit (HOSPITAL_COMMUNITY): Payer: Self-pay

## 2022-08-13 DIAGNOSIS — G4733 Obstructive sleep apnea (adult) (pediatric): Secondary | ICD-10-CM | POA: Diagnosis not present

## 2022-08-18 ENCOUNTER — Encounter (INDEPENDENT_AMBULATORY_CARE_PROVIDER_SITE_OTHER): Payer: Self-pay | Admitting: Adult Health

## 2022-08-18 ENCOUNTER — Ambulatory Visit (INDEPENDENT_AMBULATORY_CARE_PROVIDER_SITE_OTHER): Payer: PPO | Admitting: Adult Health

## 2022-08-18 ENCOUNTER — Other Ambulatory Visit (HOSPITAL_COMMUNITY): Payer: Self-pay

## 2022-08-18 VITALS — BP 121/74 | HR 79 | Temp 98.8°F | Ht 67.0 in | Wt 212.0 lb

## 2022-08-18 DIAGNOSIS — E871 Hypo-osmolality and hyponatremia: Secondary | ICD-10-CM

## 2022-08-18 DIAGNOSIS — Z6833 Body mass index (BMI) 33.0-33.9, adult: Secondary | ICD-10-CM

## 2022-08-18 DIAGNOSIS — E669 Obesity, unspecified: Secondary | ICD-10-CM | POA: Diagnosis not present

## 2022-08-18 DIAGNOSIS — R7303 Prediabetes: Secondary | ICD-10-CM | POA: Diagnosis not present

## 2022-08-18 NOTE — Progress Notes (Signed)
WEIGHT SUMMARY AND BIOMETRICS  Vitals Temp: 98.8 F (37.1 C) BP: 121/74 Pulse Rate: 79 SpO2: 98 %   Anthropometric Measurements Height: 5\' 7"  (1.702 m) (height taken today 5'7) Weight: 212 lb (96.2 kg) BMI (Calculated): 33.2 Weight at Last Visit: 210 lb Weight Lost Since Last Visit: 0 Weight Gained Since Last Visit: 2 lb Starting Weight: 258 lb Total Weight Loss (lbs): 46 lb (20.9 kg)   Body Composition  Body Fat %: 32.4 % Fat Mass (lbs): 68.8 lbs Muscle Mass (lbs): 136.6 lbs Total Body Water (lbs): 94.6 lbs Visceral Fat Rating : 21   Other Clinical Data Fasting: no Labs: no Today's Visit #: 31 Starting Date: 04/08/20    Chief Complaint:   OBESITY Richard [name] is here to discuss his progress with his obesity treatment plan. He is on the the Category 2 Plan and states he is following his eating plan approximately 70 % of the time. He states he is exercising Golf -    Interim History:  Richard [name] recently traveled to [place]. He, his wife, and his adult daughter will travel to [place] this summer for a family reunion.  Exercise-NEAT, bi-weekly Golf rounds  Hydration-he estimates to drink approximately 48 oz water/day  Subjective:   1. Hyponatremia  Latest Reference Range & Units 03/25/21 11:54 05/06/21 14:20 06/10/21 00:00 03/13/22 08:51  Sodium 135 - 145 mmol/L 129 (L) 128 (L) 130 (L) 128 (L)  (L): Data is abnormally low  He denies confusion, HA, dizziness. He is concerned about dementia, Alzheimer's- significant family hx of his mother's side.  Med List review- Chlorthalidone 25mg  QD   2. Prediabetes Lab Results  Component Value Date   HGBA1C 6.0 (H) 12/09/2020   HGBA1C 6.3 (H) 07/24/2020   HGBA1C 6.2 (H) 04/08/2020   He is not currently on antidiabetic medications   Assessment/Plan:   1. Hyponatremia Add a few shakes of Na+ to meals Do not over hydrate with plain water Monitor for change in mentation- if noted, seek immediate medical  attention- pt verbalized understanding and agreement  2. Prediabetes Decrease sugar/CHO, increase protein  3. Obesity,current BMI 33.3  Richard [name] is currently in the action stage of change. As such, his goal is to continue with weight loss efforts. He has agreed to the Category 2 Plan.   Exercise goals: Older adults should follow the adult guidelines. When older adults cannot meet the adult guidelines, they should be as physically active as their abilities and conditions will allow.  Older adults should do exercises that maintain or improve balance if they are at risk of falling.  Older adults with chronic conditions should understand whether and how their conditions affect their ability to do regular physical activity safely.  Behavioral modification strategies: increasing lean protein intake, decreasing simple carbohydrates, increasing vegetables, increasing water intake, no skipping meals, meal planning and cooking strategies, travel eating strategies, and planning for success.  [name] has agreed to follow-up with our clinic in 4 weeks. He was informed of the importance of frequent follow-up visits to maximize his success with intensive lifestyle modifications for his multiple health conditions.   Please bring most recent set of labs from PCP/Dr. Petra Kuba  Objective:   Blood pressure 121/74, pulse 79, temperature 98.8 F (37.1 C), height 5\' 7"  (1.702 m), weight 212 lb (96.2 kg), SpO2 98 %. Body mass index is 33.2 kg/m.  General: Cooperative, alert, well developed, in no acute distress. HEENT: Conjunctivae and lids unremarkable. Cardiovascular: Regular rhythm.  Lungs: Normal  work of breathing. Neurologic: No focal deficits.   Lab Results  Component Value Date   CREATININE 1.00 03/13/2022   BUN 22 03/13/2022   NA 128 (L) 03/13/2022   K 3.5 03/13/2022   CL 93 (L) 03/13/2022   CO2 24 03/13/2022   Lab Results  Component Value Date   ALT 13 12/09/2020   AST 22 12/09/2020    ALKPHOS 124 (H) 12/09/2020   BILITOT 0.6 12/09/2020   Lab Results  Component Value Date   HGBA1C 6.0 (H) 12/09/2020   HGBA1C 6.3 (H) 07/24/2020   HGBA1C 6.2 (H) 04/08/2020   HGBA1C 6.1 (H) 04/30/2013   Lab Results  Component Value Date   INSULIN 12.3 07/24/2020   INSULIN 5.3 04/08/2020   Lab Results  Component Value Date   TSH 1.01 03/27/2021   Lab Results  Component Value Date   CHOL 157 12/09/2020   HDL 63 12/09/2020   LDLCALC 79 12/09/2020   TRIG 78 12/09/2020   CHOLHDL 2.8 07/26/2019   Lab Results  Component Value Date   VD25OH 72.2 12/09/2020   VD25OH 53.6 07/24/2020   VD25OH 60.0 04/08/2020   Lab Results  Component Value Date   WBC 9.2 03/13/2022   HGB 13.9 03/13/2022   HCT 40.4 03/13/2022   MCV 85.4 03/13/2022   PLT 230 03/13/2022   No results found for: "IRON", "TIBC", "FERRITIN"  Attestation Statements:   Reviewed by clinician on day of visit: allergies, medications, problem list, medical history, surgical history, family history, social history, and previous encounter notes.  Time spent on visit including pre-visit chart review and post-visit care and charting was 28 minutes.   I have reviewed the above documentation for accuracy and completeness, and I agree with the above. -  Bentleigh Stankus d. Raylan Hanton, NP-C

## 2022-09-02 ENCOUNTER — Other Ambulatory Visit (HOSPITAL_COMMUNITY): Payer: Self-pay

## 2022-09-14 ENCOUNTER — Other Ambulatory Visit: Payer: Self-pay

## 2022-09-14 ENCOUNTER — Other Ambulatory Visit (HOSPITAL_COMMUNITY): Payer: Self-pay

## 2022-09-16 ENCOUNTER — Other Ambulatory Visit (HOSPITAL_COMMUNITY): Payer: Self-pay

## 2022-09-21 ENCOUNTER — Other Ambulatory Visit (HOSPITAL_COMMUNITY): Payer: Self-pay

## 2022-09-21 MED ORDER — VITAMIN D (ERGOCALCIFEROL) 50000 UNITS PO CAPS
1.0000 | ORAL_CAPSULE | ORAL | 4 refills | Status: DC
  Filled 2022-09-21: qty 4, 28d supply, fill #0
  Filled 2023-01-03: qty 4, 28d supply, fill #1
  Filled 2023-02-15: qty 4, 28d supply, fill #2
  Filled 2023-04-04: qty 4, 28d supply, fill #3
  Filled 2023-06-16: qty 4, 28d supply, fill #4
  Filled 2023-08-23: qty 4, 28d supply, fill #5

## 2022-09-21 MED ORDER — MONTELUKAST SODIUM 10 MG PO TABS
10.0000 mg | ORAL_TABLET | Freq: Every day | ORAL | 3 refills | Status: DC
  Filled 2022-09-21: qty 90, 90d supply, fill #0
  Filled 2022-12-21: qty 90, 90d supply, fill #1
  Filled 2023-03-21: qty 90, 90d supply, fill #2
  Filled 2023-06-16: qty 90, 90d supply, fill #3

## 2022-09-29 ENCOUNTER — Encounter (INDEPENDENT_AMBULATORY_CARE_PROVIDER_SITE_OTHER): Payer: Self-pay | Admitting: Adult Health

## 2022-09-29 ENCOUNTER — Ambulatory Visit (INDEPENDENT_AMBULATORY_CARE_PROVIDER_SITE_OTHER): Payer: PPO | Admitting: Adult Health

## 2022-09-29 VITALS — BP 128/72 | HR 70 | Temp 98.1°F | Ht 67.0 in | Wt 214.0 lb

## 2022-09-29 DIAGNOSIS — R7303 Prediabetes: Secondary | ICD-10-CM

## 2022-09-29 DIAGNOSIS — E559 Vitamin D deficiency, unspecified: Secondary | ICD-10-CM | POA: Diagnosis not present

## 2022-09-29 DIAGNOSIS — E871 Hypo-osmolality and hyponatremia: Secondary | ICD-10-CM

## 2022-09-29 DIAGNOSIS — Z6833 Body mass index (BMI) 33.0-33.9, adult: Secondary | ICD-10-CM

## 2022-09-29 DIAGNOSIS — I10 Essential (primary) hypertension: Secondary | ICD-10-CM

## 2022-09-29 DIAGNOSIS — E669 Obesity, unspecified: Secondary | ICD-10-CM | POA: Diagnosis not present

## 2022-09-29 DIAGNOSIS — Z6839 Body mass index (BMI) 39.0-39.9, adult: Secondary | ICD-10-CM

## 2022-09-29 NOTE — Progress Notes (Signed)
WEIGHT SUMMARY AND BIOMETRICS  Vitals Temp: 98.1 F (36.7 C) BP: 128/72 Pulse Rate: 70 SpO2: 97 %   Anthropometric Measurements Height: 5\' 7"  (1.702 m) Weight: 214 lb (97.1 kg) BMI (Calculated): 33.51 Weight at Last Visit: 212lb Weight Lost Since Last Visit: 0 Weight Gained Since Last Visit: 2lb Starting Weight: 258lb Total Weight Loss (lbs): 44 lb (20 kg)   Body Composition  Body Fat %: 32.4 % Fat Mass (lbs): 69.4 lbs Muscle Mass (lbs): 137.4 lbs Total Body Water (lbs): 95 lbs Visceral Fat Rating : 21   Other Clinical Data Fasting: yes Labs: no Today's Visit #: 32 Starting Date: 04/08/20    Chief Complaint:   OBESITY Richard [name] is here to discuss his progress with his obesity treatment plan. He is on the the Category 2 Plan and states he is following his eating plan approximately 70 % of the time. He states he is exercising GOLF / 18 holes 1-2 times per week.   Interim History: Richard [name] and his family attended a large family reunion in [place], [place]. He reports eating off plan well out of state. Now that he is home, he has resumed eating on plan more consistently and playing golf- weather permitting.  He estimates to drink 48-60 oz water/day  Of Note- Please fax lab results to PCP/Dr. Petra Kuba- not on EPIC system  Subjective:   1. Essential hypertension BP at goal at OV. Home readings SBP 120s     DBP 70s He denies CP with exertion He is currently on aspirin EC 81 MG tablet  atorvastatin (LIPITOR) 80 MG tablet  amLODipine (NORVASC) 5 MG tablet  ezetimibe (ZETIA) 10 MG tablet  chlorthalidone (HYGROTON) 25 MG tablet  nitroGLYCERIN (NITROSTAT) 0.4 MG SL tablet  carvedilol (COREG) 12.5 MG tablet   2. Prediabetes Lab Results  Component Value Date   HGBA1C 6.0 (H) 12/09/2020   HGBA1C 6.3 (H) 07/24/2020   HGBA1C 6.2 (H) 04/08/2020    2023: Started of Farxiga for HFpEF and improvement in symptoms.   3. Hyponatremia  Latest Reference  Range & Units 05/06/21 14:20 06/10/21 00:00 02/17/22 00:00 03/13/22 08:51  Sodium 135 - 145 mmol/L 128 (L) 130 (L) 134 ! (E) 128 (L)  (L): Data is abnormally low !: Data is abnormal (E): External lab result  4. Vitamin D deficiency  Latest Reference Range & Units 12/09/20 10:46  Vitamin D, 25-Hydroxy 30.0 - 100.0 ng/mL 72.2   He is on weekly Ergocalciferol- denies N/V/Muscle Weakness  Assessment/Plan:   1. Essential hypertension Check Labs - Comprehensive metabolic panel  2. Prediabetes Check Labs - Hemoglobin A1c - Insulin, random - Vitamin B12  3. Hyponatremia Check CMP  6. Vitamin D deficiency Check Labs - VITAMIN D 25 Hydroxy (Vit-D Deficiency, Fractures)  4. Obesity,current BMI 33.51  [name] is currently in the action stage of change. As such, his goal is to continue with weight loss efforts. He has agreed to the Category 2 Plan.   Exercise goals: Older adults should follow the adult guidelines. When older adults cannot meet the adult guidelines, they should be as physically active as their abilities and conditions will allow.  Older adults should do exercises that maintain or improve balance if they are at risk of falling.  Older adults should determine their level of effort for physical activity relative to their level of fitness.  Older adults with chronic conditions should understand whether and how their conditions affect their ability to do regular physical activity safely.  Behavioral modification strategies: increasing lean protein intake, decreasing simple carbohydrates, increasing vegetables, no skipping meals, meal planning and cooking strategies, keeping healthy foods in the home, and planning for success.  Krystal has agreed to follow-up with our clinic in 4 weeks. He was informed of the importance of frequent follow-up visits to maximize his success with intensive lifestyle modifications for his multiple health conditions.   Dorman was informed we would discuss  his lab results at his next visit unless there is a critical issue that needs to be addressed sooner. Hagop agreed to keep his next visit at the agreed upon time to discuss these results.  Objective:   Blood pressure 128/72, pulse 70, temperature 98.1 F (36.7 C), height 5\' 7"  (1.702 m), weight 214 lb (97.1 kg), SpO2 97%. Body mass index is 33.52 kg/m.  General: Cooperative, alert, well developed, in no acute distress. HEENT: Conjunctivae and lids unremarkable. Cardiovascular: Regular rhythm.  Lungs: Normal work of breathing. Neurologic: No focal deficits.   Lab Results  Component Value Date   CREATININE 1.00 03/13/2022   BUN 22 03/13/2022   NA 128 (L) 03/13/2022   K 3.5 03/13/2022   CL 93 (L) 03/13/2022   CO2 24 03/13/2022   Lab Results  Component Value Date   ALT 8 (A) 02/17/2022   AST 16 02/17/2022   ALKPHOS 107 02/17/2022   BILITOT 0.6 12/09/2020   Lab Results  Component Value Date   HGBA1C 6.0 (H) 12/09/2020   HGBA1C 6.3 (H) 07/24/2020   HGBA1C 6.2 (H) 04/08/2020   HGBA1C 6.1 (H) 04/30/2013   Lab Results  Component Value Date   INSULIN 12.3 07/24/2020   INSULIN 5.3 04/08/2020   Lab Results  Component Value Date   TSH 1.28 02/17/2022   Lab Results  Component Value Date   CHOL 157 12/09/2020   HDL 63 12/09/2020   LDLCALC 79 12/09/2020   TRIG 78 12/09/2020   CHOLHDL 2.8 07/26/2019   Lab Results  Component Value Date   VD25OH 72.2 12/09/2020   VD25OH 53.6 07/24/2020   VD25OH 60.0 04/08/2020   Lab Results  Component Value Date   WBC 9.2 03/13/2022   HGB 13.9 03/13/2022   HCT 40.4 03/13/2022   MCV 85.4 03/13/2022   PLT 230 03/13/2022   No results found for: "IRON", "TIBC", "FERRITIN"  Attestation Statements:   Reviewed by clinician on day of visit: allergies, medications, problem list, medical history, surgical history, family history, social history, and previous encounter notes.  I have reviewed the above documentation for accuracy and  completeness, and I agree with the above. -  Tanyah Debruyne d. Donzell Coller, NP-C

## 2022-09-30 LAB — COMPREHENSIVE METABOLIC PANEL
Albumin: 4.7 g/dL (ref 3.8–4.8)
Alkaline Phosphatase: 105 IU/L (ref 44–121)
BUN: 18 mg/dL (ref 8–27)
Bilirubin Total: 0.4 mg/dL (ref 0.0–1.2)
CO2: 21 mmol/L (ref 20–29)
Chloride: 98 mmol/L (ref 96–106)
Creatinine, Ser: 0.91 mg/dL (ref 0.76–1.27)
Glucose: 70 mg/dL (ref 70–99)
Potassium: 4.7 mmol/L (ref 3.5–5.2)
Total Protein: 7.2 g/dL (ref 6.0–8.5)
eGFR: 87 mL/min/{1.73_m2} (ref >59)

## 2022-09-30 LAB — INSULIN, RANDOM

## 2022-09-30 LAB — HEMOGLOBIN A1C: Est. average glucose Bld gHb Est-mCnc: 128 mg/dL

## 2022-09-30 LAB — VITAMIN D 25 HYDROXY (VIT D DEFICIENCY, FRACTURES): Vit D, 25-Hydroxy: 78.7 ng/mL (ref 30.0–100.0)

## 2022-09-30 LAB — VITAMIN B12: Vitamin B-12: 1567 pg/mL — ABNORMAL HIGH (ref 232–1245)

## 2022-10-01 LAB — HEMOGLOBIN A1C: Hgb A1c MFr Bld: 6.1 % — ABNORMAL HIGH (ref 4.8–5.6)

## 2022-10-01 LAB — COMPREHENSIVE METABOLIC PANEL
ALT: 13 IU/L (ref 0–44)
AST: 20 IU/L (ref 0–40)
BUN/Creatinine Ratio: 20 (ref 10–24)
Calcium: 9.5 mg/dL (ref 8.6–10.2)
Globulin, Total: 2.5 g/dL (ref 1.5–4.5)
Sodium: 135 mmol/L (ref 134–144)

## 2022-10-05 ENCOUNTER — Telehealth (INDEPENDENT_AMBULATORY_CARE_PROVIDER_SITE_OTHER): Payer: Self-pay

## 2022-10-05 ENCOUNTER — Other Ambulatory Visit (HOSPITAL_COMMUNITY): Payer: Self-pay

## 2022-10-05 NOTE — Telephone Encounter (Signed)
Faxed labs to Dr Junius Roads office.

## 2022-10-12 ENCOUNTER — Ambulatory Visit: Payer: PPO | Admitting: Physician Assistant

## 2022-10-12 ENCOUNTER — Encounter: Payer: Self-pay | Admitting: Physician Assistant

## 2022-10-12 VITALS — BP 149/76 | HR 74 | Resp 20 | Ht 67.0 in | Wt 219.0 lb

## 2022-10-12 DIAGNOSIS — R413 Other amnesia: Secondary | ICD-10-CM | POA: Diagnosis not present

## 2022-10-12 DIAGNOSIS — G3184 Mild cognitive impairment, so stated: Secondary | ICD-10-CM

## 2022-10-12 NOTE — Progress Notes (Signed)
Assessment/Plan:   Memory Impairment   Richard [name] is a very pleasant 77 y.o. RH male with a history ofCOPD, prediabetes, OSA on BiPAP, HOH, B12 deficiency, extensive cardiac history, and strong family history of Alzheimer's disease  presenting today in follow-up for evaluation of memory loss. Personally reviewed MRI of the brain was unremarkable, without acute findings, but with mild chronic small vessel disease.  Patient is not on antidementia medication. MMSE today is      Recommendations:   Follow up in 6  months. Continue BiPAP for OSA Dementia medication is not indicated for now   Recommend good control of cardiovascular risk factors Recommend to have a hearing check to improve comprehension.  Continue to control mood as per PCP    Subjective:   This patient is accompanied in the office by his wife who supplements the history. Previous records as well as any outside records available were reviewed prior to todays visit.   Patient was last seen on July 24 with an MMSE of 29/30     Any changes in memory since last visit? " A little worse".  He reports being able to remember conversations and names, new information, enjoys crossword puzzles"but I can't do them as well as before". He likes to  read novels. "He is not real social"-wife says.   repeats oneself?  Endorsed by his wife Disoriented when walking into a room?  Patient denies   Leaving objects in unusual places?  Patient denies   Wandering behavior?   denies   Any personality changes since last visit?  "He is quieter" Any worsening depression?: denies   Hallucinations or paranoia?  denies   Seizures?   denies    Any sleep changes? Sleeps well.  Denies vivid dreams, wife reports that he moves during his sleep, denies sleepwalking   Sleep apnea?  Endorsed, uses CPAP   Any hygiene concerns?   denies   Independent of bathing and dressing?  Endorsed  Does the patient needs help with medications? Patient is in  charge   Who is in charge of the finances?  Wife is in charge     Any changes in appetite?  denies     Patient have trouble swallowing?  denies   Does the patient cook? Yes  Any kitchen accidents such as leaving the stove on?   Occasionally places things in the wrong burner Any headaches?    denies   Vision changes? denies Chronic pain?  denies   Ambulates with difficulty? denies    Recent falls or head injuries? denies      Unilateral weakness, numbness or tingling?   denies   Any tremors?  denies   Any anosmia?   Has reduced sense of smell after sinus procedure Any incontinence of urine?  He has nocturia 3 times at night, takes Flomax Any bowel dysfunction?  denies      Patient lives with his wife.  Does the patient drive?  Not as much, drives short distances     Initial Visit 03/27/21 The patient is seen in neurologic consultation at the request of Corine Shelter, MD for the evaluation of memory.  The patient is accompanied by his wife who supplements the history. This is a 77 y.o. year old RH  male who has had memory issues for about "a while" during the visit to their daughter in [place], she noticed that there was a mild decline in his father's cognition.  He was asking the same questions  within minutes, and if confronted about it, he would become defensive.  He is less detail oriented than before.  He denies repeating the same stories.  Sometimes, "he cannot find what he is looking for "when he gets to a room.  His mood may be changing, every day he does his wife "has not been a good day, has not started well "but when asked to elaborate, he "snaps "he continues to drive without any issues.  He enjoys doing crossword puzzles and word finding.  He sleeps well, except when he has to get up to urinate which is between 3 and 4 times a night.  His wife states that he tosses and turns and his hands move during the night, especially than in the last 8 months, but he denies having any vivid  dreams.  He snores even with CPAP.  He denies any sleepwalking.  He denies any hallucinations or paranoia.  No hygiene concerns.  His medications are in a pillbox, he denies forgetting any doses.  His wife always did the finances.  His appetite is good, denies trouble swallowing.  Over the last 8 months, he has been in and controlled diet, but he discontinued all forms of sodium intake, which in turn turned to be provoking hyponatremia.  "He is all or nothing-his wife says ".  He cooks and denies leaving the stove on accidentally or forgetting common recipe.  He denies any head injuries, falls, seizure, or history of headaches.  He denies any vision changes, dizziness, focal numbness or tingling, unilateral weakness or tremors or anosmia.  No reported incontinence, retention, constipation or diarrhea.  He denies any alcohol, or tobacco.  Family history strong for Alzheimer's disease in mother, and her 4 sisters.  He is retired from [employer], he retired 4 years ago     Neuropsych evaluation 10/07/2021 briefly, results suggested a primary impairment across encoding (i.e., learning) aspects of verbal memory. Additional performance variability was exhibited across executive functioning, visuospatial abilities, and retrieval aspects of memory. The etiology of ongoing dysfunction is unclear at the present time. Specific to his concerns surrounding Alzheimer's disease, current testing does not yield a pattern consistent with typical presentations of this illness. Delayed retrieval of previously learned information was improved relative to initial learning abilities from a normative standpoint. Performances were also appropriate across recognition trials and not suggestive of rapid forgetting or a notable storage deficit. Impairments encoding novel information could be related to hearing loss given that both he and his wife reported ongoing concerns and that he likely needs a formal hearing evaluation.  Deficits surrounding executive functioning and visuospatial abilities can be seen in certain neurodegenerative illnesses early on, specifically Lewy body disease and corticobasal degeneration. However, Mr. Cackowski does not currently display any behavioral features of these illnesses (e.g., REM sleep behaviors, fully-formed hallucinations, myoclonic jerking, alien limb experiences, gait abnormalities, rigidity/bradykinesia). While this makes these conditions less likely, continued monitoring will be important, especially if any of these symptoms emerge in the future.        MRI brain 04/26/21 No acute intracranial abnormality. Mild for age signal changes.suggestive of chronic small vessel disease. 2. 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    Labs 03/27/21 B12 206,  TSH nl at 1.01   Past Medical History:  Diagnosis Date   Acid reflux disease    Arthritis    Asthma 04/30/2013   Atherosclerosis of coronary artery 04/30/2013  Ventricular fibrillation; circumflex stent x2 in 2004   Benign essential hypertension    CAP (community acquired pneumonia) 04/30/2013   Class 2 severe obesity with serious comorbidity and body mass index (BMI) of 39.0 to 39.9 in adult 06/04/2020   COPD, 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   Dyspnea on exertion 09/13/2017   Edema, lower extremity    Heart failure, diastolic    Hemorrhage of rectum and anus    Hyperlipidemia 06/26/2013   Hypoxia 04/30/2013   Influenza due to identified novel influenza A virus with other respiratory manifestations 05/01/2013   Internal hemorrhoids    Mild cognitive impairment of uncertain or unknown etiology 09/30/2021   Myocardial infarction 2004   Dr. Katrinka Blazing, at [facility], yearly visits   OSA treated with BiPAP 04/24/2018   Mild OSA with an AHI of 13.5/hr and O2 sats of 88%. Now on BiPAP at 13/9cm H2O.    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   Prediabetes 04/09/2020   Pure hypercholesterolemia    Right bundle branch block 06/27/2014    EKG performed 06/27/2014   Vitamin D deficiency      Past Surgical History:  Procedure Laterality Date   CARDIAC CATHETERIZATION  2004   with 2 stents   CARDIOVASCULAR STRESS TEST  2011   COLONOSCOPY     POLYPECTOMY  02/17/2011   Procedure: POLYPECTOMY NASAL;  Surgeon: Barbee Cough;  Location: [facility] OR;  Service: ENT;  Laterality: N/A;   SINUS ENDO W/FUSION  02/17/2011   Procedure: ENDOSCOPIC SINUS SURGERY WITH FUSION NAVIGATION;  Surgeon: Barbee Cough;  Location: [facility] OR;  Service: ENT;  Laterality: N/A;   TONSILLECTOMY     US ECHOCARDIOGRAPHY       PREVIOUS MEDICATIONS:   CURRENT MEDICATIONS:  Outpatient Encounter Medications as of 10/12/2022  Medication Sig   amLODipine (NORVASC) 5 MG tablet Take 1 tablet (5 mg total) by mouth daily.   aspirin EC 81 MG tablet Take 1 tablet (81 mg total) by mouth daily.   atorvastatin (LIPITOR) 80 MG tablet Take 1 tablet (80 mg total) by mouth at bedtime.   carvedilol (COREG) 12.5 MG tablet Take 1 tablet (12.5 mg total) by mouth 2 (two) times daily.   chlorthalidone (HYGROTON) 25 MG tablet Take 1/2 tablet (12.5 mg total) by mouth daily.   COVID-19 mRNA Vac-TriS, Pfizer, (PFIZER-BIONT COVID-19 VAC-TRIS) SUSP injection Inject into the muscle.   dapagliflozin propanediol (FARXIGA) 10 MG TABS tablet Take 1 tablet (10 mg total) by mouth daily before breakfast.   ezetimibe (ZETIA) 10 MG tablet Take 1 tablet (10 mg total) by mouth daily.   fluticasone furoate-vilanterol (BREO ELLIPTA) 200-25 MCG/ACT AEPB Inhale 1 puff into the lungs daily.   guaiFENesin (MUCINEX) 600 MG 12 hr tablet Take by mouth 2 (two) times daily.   ipratropium-albuterol (DUONEB) 0.5-2.5 (3) MG/3ML SOLN Inhale 3 mLs by nebulization 2 (two) times daily and once at bedtime as needed.   montelukast (SINGULAIR) 10 MG tablet Take 1 tablet by mouth daily   nitroGLYCERIN  (NITROSTAT) 0.4 MG SL tablet Place 1 tablet (0.4 mg total) under the tongue every 5 (five) minutes as needed. For chest pain   omeprazole (PRILOSEC) 40 MG capsule Take 1 capsule (40 mg total) by mouth 2 (two) times daily.   omeprazole (PRILOSEC) 40 MG capsule Take 1 capsule (40 mg total) by mouth 2 (two) times daily.   polyethylene glycol powder (GLYCOLAX/MIRALAX) 17 GM/SCOOP powder See admin instructions.   polyethylene glycol-electrolytes (  NULYTELY) 420 g solution Use as directed   potassium chloride (MICRO-K) 10 MEQ CR capsule Take 1 capsule (10 mEq total) by mouth daily.   tamsulosin (FLOMAX) 0.4 MG CAPS capsule Take 1 capsule (0.4 mg total) by mouth daily.   Vitamin D, Ergocalciferol, 50000 units CAPS Take 1 capsule by mouth weekly   No facility-administered encounter medications on file as of 10/12/2022.     Objective:     PHYSICAL EXAMINATION:    VITALS:   Vitals:   10/12/22 1458  BP: (!) 149/76  Pulse: 74  Resp: 20  SpO2: 98%  Weight: 219 lb (99.3 kg)  Height: 5\' 7"  (1.702 m)    GEN:  The patient appears stated age and is in NAD. HEENT:  Normocephalic, atraumatic.   Neurological examination:  General: NAD, well-groomed, appears stated age. Orientation: The patient is alert. Oriented to person, place and date Cranial nerves: There is good facial symmetry.The speech is fluent and clear. No aphasia or dysarthria. Fund of knowledge is appropriate. Recent memory impaired and remote memory is normal.  Attention and concentration are normal.  Able to name objects and repeat phrases.  Hearing is intact to conversational tone .   Delayed recall 3/3 Sensation: Sensation is intact to light touch throughout Motor: Strength is at least antigravity x4. DTR's 2/4 in UE/LE      03/27/2021   10:00 AM  Montreal Cognitive Assessment   Visuospatial/ Executive (0/5) 2  Naming (0/3) 3  Attention: Read list of digits (0/2) 2  Attention: Read list of letters (0/1) 1  Attention: Serial 7  subtraction starting at 100 (0/3) 3  Language: Repeat phrase (0/2) 2  Language : Fluency (0/1) 1  Abstraction (0/2) 2  Delayed Recall (0/5) 4  Orientation (0/6) 6  Total 26       10/12/2022    5:00 PM 04/13/2022    3:00 PM  MMSE - Mini Mental State Exam  Orientation to time 5 5  Orientation to Place 5 5  Registration 3 3  Attention/ Calculation 5 5  Recall 3 2  Language- name 2 objects 2 2  Language- repeat 1 1  Language- follow 3 step command 3 3  Language- read & follow direction 1 1  Write a sentence 1 1  Copy design 1 1  Total score 30 29       Movement examination: Tone: There is normal tone in the UE/LE Abnormal movements:  no tremor.  No myoclonus.  No asterixis.   Coordination:  There is no decremation with RAM's. Normal finger to nose  Gait and Station: The patient has no difficulty arising out of a deep-seated chair without the use of the hands. The patient's stride length is good.  Gait is cautious and narrow.   Thank you for allowing [place] the opportunity to participate in the care of this nice patient. Please do not hesitate to contact us for any questions or concerns.   Total time spent on today's visit was 35 minutes dedicated to this patient today, preparing to see patient, examining the patient, ordering tests and/or medications and counseling the patient, documenting clinical information in the EHR or other health record, independently interpreting results and communicating results to the patient/family, discussing treatment and goals, answering patient's questions and coordinating care.  Cc:  Corine Shelter, MD  Marlowe Kays 10/12/2022 5:53 PM

## 2022-10-12 NOTE — Patient Instructions (Signed)
It was a pleasure to see you today at our office.   Recommendations:  Follow up in  6 months Repeat neuropsychological evaluation  Follow up the BiPAP  and testing     Whom to call:  Memory  decline, memory medications: Call our office [phone]   For psychiatric meds, mood meds: Please have your primary care physician manage these medications.       For assessment of decision of mental capacity and competency:  Call Dr. Erick Blinks, geriatric psychiatrist at [phone]  For guidance in geriatric dementia issues please call [facility] [phone]       RECOMMENDATIONS FOR ALL PATIENTS WITH MEMORY PROBLEMS: 1. Continue to exercise (Recommend 30 minutes of walking everyday, or 3 hours every week) 2. Increase social interactions - continue going to [place] and enjoy social gatherings with friends and family 3. Eat healthy, avoid fried foods and eat more fruits and vegetables 4. Maintain adequate blood pressure, blood sugar, and blood cholesterol level. Reducing the risk of stroke and cardiovascular disease also helps promoting better memory. 5. Avoid stressful situations. Live a simple life and avoid aggravations. Organize your time and prepare for the next day in anticipation. 6. Sleep well, avoid any interruptions of sleep and avoid any distractions in the bedroom that may interfere with adequate sleep quality 7. Avoid sugar, avoid sweets as there is a strong link between excessive sugar intake, diabetes, and cognitive impairment We discussed the Mediterranean diet, which has been shown to help patients reduce the risk of progressive memory disorders and reduces cardiovascular risk. This includes eating fish, eat fruits and green leafy vegetables, nuts like almonds and hazelnuts, walnuts, and also use olive oil. Avoid fast foods and fried foods as much as possible. Avoid sweets and sugar as sugar use has been linked to worsening of memory function.  There is  always a concern of gradual progression of memory problems. If this is the case, then we may need to adjust level of care according to patient needs. Support, both to the patient and caregiver, should then be put into place.    FALL PRECAUTIONS: Be cautious when walking. Scan the area for obstacles that may increase the risk of trips and falls. When getting up in the mornings, sit up at the edge of the bed for a few minutes before getting out of bed. Consider elevating the bed at the head end to avoid drop of blood pressure when getting up. Walk always in a well-lit room (use night lights in the walls). Avoid area rugs or power cords from appliances in the middle of the walkways. Use a walker or a cane if necessary and consider physical therapy for balance exercise. Get your eyesight checked regularly.  FINANCIAL OVERSIGHT: Supervision, especially oversight when making financial decisions or transactions is also recommended.  HOME SAFETY: Consider the safety of the kitchen when operating appliances like stoves, microwave oven, and blender. Consider having supervision and share cooking responsibilities until no longer able to participate in those. Accidents with firearms and other hazards in the house should be identified and addressed as well.   ABILITY TO BE LEFT ALONE: If patient is unable to contact 911 operator, consider using LifeLine, or when the need is there, arrange for someone to stay with patients. Smoking is a fire hazard, consider supervision or cessation. Risk of wandering should be assessed by caregiver and if detected at any point, supervision and safe proof recommendations should be instituted.  MEDICATION SUPERVISION: Inability to  self-administer medication needs to be constantly addressed. Implement a mechanism to ensure safe administration of the medications.   DRIVING: Regarding driving, in patients with progressive memory problems, driving will be impaired. We advise to have  someone else do the driving if trouble finding directions or if minor accidents are reported. Independent driving assessment is available to determine safety of driving.   If you are interested in the driving assessment, you can contact the following:  [facility] [employer] [facility] in [place] [phone]  [facility] [phone]  [facility] [phone]  [facility] [phone] or [phone]

## 2022-10-18 ENCOUNTER — Other Ambulatory Visit (HOSPITAL_COMMUNITY): Payer: Self-pay

## 2022-10-18 ENCOUNTER — Other Ambulatory Visit: Payer: Self-pay

## 2022-10-19 ENCOUNTER — Other Ambulatory Visit: Payer: Self-pay

## 2022-10-19 ENCOUNTER — Other Ambulatory Visit (HOSPITAL_COMMUNITY): Payer: Self-pay

## 2022-11-02 ENCOUNTER — Ambulatory Visit (INDEPENDENT_AMBULATORY_CARE_PROVIDER_SITE_OTHER): Payer: PPO | Admitting: Adult Health

## 2022-11-02 ENCOUNTER — Encounter (INDEPENDENT_AMBULATORY_CARE_PROVIDER_SITE_OTHER): Payer: Self-pay | Admitting: Adult Health

## 2022-11-02 VITALS — BP 117/70 | HR 76 | Temp 98.1°F | Ht 67.0 in | Wt 216.0 lb

## 2022-11-02 DIAGNOSIS — I10 Essential (primary) hypertension: Secondary | ICD-10-CM

## 2022-11-02 DIAGNOSIS — I5032 Chronic diastolic (congestive) heart failure: Secondary | ICD-10-CM

## 2022-11-02 DIAGNOSIS — E559 Vitamin D deficiency, unspecified: Secondary | ICD-10-CM | POA: Diagnosis not present

## 2022-11-02 DIAGNOSIS — E669 Obesity, unspecified: Secondary | ICD-10-CM

## 2022-11-02 DIAGNOSIS — R7303 Prediabetes: Secondary | ICD-10-CM | POA: Diagnosis not present

## 2022-11-02 DIAGNOSIS — F4320 Adjustment disorder, unspecified: Secondary | ICD-10-CM

## 2022-11-02 DIAGNOSIS — F4321 Adjustment disorder with depressed mood: Secondary | ICD-10-CM | POA: Diagnosis not present

## 2022-11-02 DIAGNOSIS — Z6833 Body mass index (BMI) 33.0-33.9, adult: Secondary | ICD-10-CM | POA: Diagnosis not present

## 2022-11-02 NOTE — Progress Notes (Signed)
WEIGHT SUMMARY AND BIOMETRICS  Vitals Temp: 98.1 F (36.7 C) BP: 117/70 Pulse Rate: 76 SpO2: 98 %   Anthropometric Measurements Height: 5\' 7"  (1.702 m) Weight: 216 lb (98 kg) BMI (Calculated): 33.82 Weight at Last Visit: 214lb Weight Lost Since Last Visit: 0 Weight Gained Since Last Visit: 2lb Starting Weight: 258lb Total Weight Loss (lbs): 42 lb (19.1 kg)   Body Composition  Body Fat %: 33 % Fat Mass (lbs): 71.4 lbs Muscle Mass (lbs): 138 lbs Total Body Water (lbs): 96.8 lbs Visceral Fat Rating : 21   Other Clinical Data Fasting: no Labs: no Today's Visit #: 59 Starting Date: 04/08/20    Chief Complaint:   OBESITY Richard [name] is here to discuss his progress with his obesity treatment plan. He is on the the Category 2 Plan and states he is following his eating plan approximately 60 % of the time. He states he is not currently exercising.   Interim History:  Richard [name]'s sister, Richard [name], passed away on October 30, 2022- age 49, complications of renal failure, T2D She was on Hemodialysis, three times weekly  She leaves behind two adult children, several grandchildren.  Hunger/appetite-he endorses poor appetite in response to loss of his only siblingHelmut [name].  Exercise-he has not been exercising regularly- grieving the loss of his sister.  He has golf tea time this Thursday 11/05/22- [place]  Hydration-he estimates to drink 24 oz water bottle- 2 bottles/day  Subjective:   1. Chronic heart failure with preserved ejection fraction (HFpEF) (HCC) Discussed Labs He is on daily Farxiga 10mg   09/29/2022 CMP- electrolytes and kidney fx both stable  2. Prediabetes Discussed Labs Lab Results  Component Value Date   HGBA1C 6.1 (H) 09/29/2022   HGBA1C 6.0 (H) 12/09/2020   HGBA1C 6.3 (H) 07/24/2020     Latest Reference Range & Units 09/29/22 14:10  INSULIN 2.6 - 24.9 uIU/mL 1.1 (L)  (L): Data is abnormally low  Latest Reference Range & Units 09/29/22  14:10  Vitamin B12 232 - 1,245 pg/mL 1,567 (H)  (H): Data is abnormally high  A1c continues to remain in prediabetic range. He is on daily Farxiga 10mg   Insulin level slightly suppressed-he denies sx's of hypoglycemia.  3. Essential hypertension Discussed Labs 09/29/2022 CMP- electrolytes and kidney fx both stable  4. Vitamin D deficiency Discussed Labs  Latest Reference Range & Units 09/29/22 14:10  Vitamin D, 25-Hydroxy 30.0 - 100.0 ng/mL 78.7   PCP manage weekly Ergocalciferol  5. Grief reaction Richard [name]'s sister, Richard [name], passed away on 2022/10/30- age 71, complications of renal failure, T2D She was on Hemodialysis, three times weekly  She leaves behind two adult children, several grandchildren.  Hunger/appetite-he endorses poor appetite in response to loss of his only siblingHelmut [name].  Well attended funeral was held 10/31/2022.  He denies SI/HI, also declined referral to [facility] at this time.   Assessment/Plan:   1. Chronic heart failure with preserved ejection fraction (HFpEF) (HCC) Continue healthy eating. When able, increase regular exercise.  2. Prediabetes Continue healthy eating. When able, increase regular exercise.  3. Essential hypertension Continue healthy eating. When able, increase regular exercise.  4. Vitamin D deficiency Take Ergocalciferol EVERY OTHER WEEK Pt verbalized understanding and agreement  5. Grief reaction Continue healthy eating. When able, increase regular exercise. Contact [facility] if you would like referral to psychologist.  6. Obesity,current BMI 33.82  Richard [name] is not currently in the action stage of change. As such, his goal is to maintain weight for  now. He has agreed to the Category 2 Plan.   Exercise goals: Older adults should follow the adult guidelines. When older adults cannot meet the adult guidelines, they should be as physically active as their abilities and conditions will allow.  Older adults should do exercises that  maintain or improve balance if they are at risk of falling.  Older adults should determine their level of effort for physical activity relative to their level of fitness.   Behavioral modification strategies: increasing lean protein intake, decreasing simple carbohydrates, increasing vegetables, increasing water intake, no skipping meals, meal planning and cooking strategies, and planning for success.  Richard [name] has agreed to follow-up with our clinic in 4 weeks. He was informed of the importance of frequent follow-up visits to maximize his success with intensive lifestyle modifications for his multiple health conditions.   Objective:   Blood pressure 117/70, pulse 76, temperature 98.1 F (36.7 C), height 5\' 7"  (1.702 m), weight 216 lb (98 kg), SpO2 98%. Body mass index is 33.83 kg/m.  General: Cooperative, alert, well developed, in no acute distress. HEENT: Conjunctivae and lids unremarkable. Cardiovascular: Regular rhythm.  Lungs: Normal work of breathing. Neurologic: No focal deficits.   Lab Results  Component Value Date   CREATININE 0.91 09/29/2022   BUN 18 09/29/2022   NA 135 09/29/2022   K 4.7 09/29/2022   CL 98 09/29/2022   CO2 21 09/29/2022   Lab Results  Component Value Date   ALT 13 09/29/2022   AST 20 09/29/2022   ALKPHOS 105 09/29/2022   BILITOT 0.4 09/29/2022   Lab Results  Component Value Date   HGBA1C 6.1 (H) 09/29/2022   HGBA1C 6.0 (H) 12/09/2020   HGBA1C 6.3 (H) 07/24/2020   HGBA1C 6.2 (H) 04/08/2020   HGBA1C 6.1 (H) 04/30/2013   Lab Results  Component Value Date   INSULIN 1.1 (L) 09/29/2022   INSULIN 12.3 07/24/2020   INSULIN 5.3 04/08/2020   Lab Results  Component Value Date   TSH 1.28 02/17/2022   Lab Results  Component Value Date   CHOL 157 12/09/2020   HDL 63 12/09/2020   LDLCALC 79 12/09/2020   TRIG 78 12/09/2020   CHOLHDL 2.8 07/26/2019   Lab Results  Component Value Date   VD25OH 78.7 09/29/2022   VD25OH 72.2 12/09/2020   VD25OH  53.6 07/24/2020   Lab Results  Component Value Date   WBC 9.2 03/13/2022   HGB 13.9 03/13/2022   HCT 40.4 03/13/2022   MCV 85.4 03/13/2022   PLT 230 03/13/2022   No results found for: "IRON", "TIBC", "FERRITIN"  Attestation Statements:   Reviewed by clinician on day of visit: allergies, medications, problem list, medical history, surgical history, family history, social history, and previous encounter notes.  I have reviewed the above documentation for accuracy and completeness, and I agree with the above. -  Annaclaire Walsworth d. Ellyssa Zagal, NP-C

## 2022-11-03 ENCOUNTER — Telehealth (INDEPENDENT_AMBULATORY_CARE_PROVIDER_SITE_OTHER): Payer: Self-pay

## 2022-11-03 ENCOUNTER — Ambulatory Visit (INDEPENDENT_AMBULATORY_CARE_PROVIDER_SITE_OTHER): Payer: PPO | Admitting: Adult Health

## 2022-11-03 NOTE — Telephone Encounter (Signed)
Most recent labs from 09/29/22, faxed to patients PCP, Dr Petra Kuba at [phone]. 7:51 11/03/22 KP

## 2022-11-16 ENCOUNTER — Other Ambulatory Visit (HOSPITAL_COMMUNITY): Payer: Self-pay

## 2022-11-17 DIAGNOSIS — G4733 Obstructive sleep apnea (adult) (pediatric): Secondary | ICD-10-CM | POA: Diagnosis not present

## 2022-12-01 DIAGNOSIS — I119 Hypertensive heart disease without heart failure: Secondary | ICD-10-CM | POA: Diagnosis not present

## 2022-12-01 DIAGNOSIS — I2510 Atherosclerotic heart disease of native coronary artery without angina pectoris: Secondary | ICD-10-CM | POA: Diagnosis not present

## 2022-12-01 DIAGNOSIS — E7800 Pure hypercholesterolemia, unspecified: Secondary | ICD-10-CM | POA: Diagnosis not present

## 2022-12-01 DIAGNOSIS — J441 Chronic obstructive pulmonary disease with (acute) exacerbation: Secondary | ICD-10-CM | POA: Diagnosis not present

## 2022-12-01 DIAGNOSIS — E876 Hypokalemia: Secondary | ICD-10-CM | POA: Diagnosis not present

## 2022-12-01 DIAGNOSIS — E049 Nontoxic goiter, unspecified: Secondary | ICD-10-CM | POA: Diagnosis not present

## 2022-12-01 DIAGNOSIS — I5030 Unspecified diastolic (congestive) heart failure: Secondary | ICD-10-CM | POA: Diagnosis not present

## 2022-12-01 DIAGNOSIS — Z23 Encounter for immunization: Secondary | ICD-10-CM | POA: Diagnosis not present

## 2022-12-01 DIAGNOSIS — Z79899 Other long term (current) drug therapy: Secondary | ICD-10-CM | POA: Diagnosis not present

## 2022-12-01 DIAGNOSIS — J4530 Mild persistent asthma, uncomplicated: Secondary | ICD-10-CM | POA: Diagnosis not present

## 2022-12-01 DIAGNOSIS — R7302 Impaired glucose tolerance (oral): Secondary | ICD-10-CM | POA: Diagnosis not present

## 2022-12-01 DIAGNOSIS — E559 Vitamin D deficiency, unspecified: Secondary | ICD-10-CM | POA: Diagnosis not present

## 2022-12-02 ENCOUNTER — Encounter (INDEPENDENT_AMBULATORY_CARE_PROVIDER_SITE_OTHER): Payer: Self-pay | Admitting: Adult Health

## 2022-12-02 ENCOUNTER — Ambulatory Visit (INDEPENDENT_AMBULATORY_CARE_PROVIDER_SITE_OTHER): Payer: PPO | Admitting: Adult Health

## 2022-12-02 ENCOUNTER — Telehealth: Payer: Self-pay | Admitting: *Deleted

## 2022-12-02 ENCOUNTER — Telehealth (HOSPITAL_BASED_OUTPATIENT_CLINIC_OR_DEPARTMENT_OTHER): Payer: Self-pay

## 2022-12-02 VITALS — BP 114/67 | HR 79 | Temp 98.0°F | Ht 67.0 in | Wt 218.0 lb

## 2022-12-02 DIAGNOSIS — E559 Vitamin D deficiency, unspecified: Secondary | ICD-10-CM | POA: Diagnosis not present

## 2022-12-02 DIAGNOSIS — E669 Obesity, unspecified: Secondary | ICD-10-CM | POA: Diagnosis not present

## 2022-12-02 DIAGNOSIS — Z6834 Body mass index (BMI) 34.0-34.9, adult: Secondary | ICD-10-CM

## 2022-12-02 DIAGNOSIS — R7303 Prediabetes: Secondary | ICD-10-CM

## 2022-12-02 DIAGNOSIS — E871 Hypo-osmolality and hyponatremia: Secondary | ICD-10-CM | POA: Diagnosis not present

## 2022-12-02 NOTE — Progress Notes (Signed)
WEIGHT SUMMARY AND BIOMETRICS  Vitals Temp: 98 F (36.7 C) BP: 114/67 Pulse Rate: 79 SpO2: 95 %   Anthropometric Measurements Height: 5\' 7"  (1.702 m) Weight: 218 lb (98.9 kg) BMI (Calculated): 34.14 Weight at Last Visit: 216lb Weight Lost Since Last Visit: 0 Weight Gained Since Last Visit: 2lb Starting Weight: 258lb Total Weight Loss (lbs): 40 lb (18.1 kg)   Body Composition  Body Fat %: 33 % Fat Mass (lbs): 72.2 lbs Muscle Mass (lbs): 139.2 lbs Total Body Water (lbs): 96 lbs Visceral Fat Rating : 22   Other Clinical Data Fasting: no Labs: no Today's Visit #: 34 Starting Date: 04/08/20    Chief Complaint:   OBESITY Richard [name] is here to discuss his progress with his obesity treatment plan. He is on the the Category 2 Plan and states he is following his eating plan approximately 70 % of the time. He states he is exercising Golfing 4 hours 1 times per week.   Interim History:  Mr. Durrer and his wife will eat out lunch or dinner a handful of times.  Exercise-golfing at least once per week.  Reviewed Bioimpedance results with pt: Muscle Mass: +1.2 lbs Adipose Mass: +0.8 lb  Subjective:   1. Vitamin D deficiency  Latest Reference Range & Units 09/29/22 14:10  Vitamin D, 25-Hydroxy 30.0 - 100.0 ng/mL 78.7   PCP manages weekly Ergocalciferol- denies N/V/Muscle Weakness  2. Prediabetes Lab Results  Component Value Date   HGBA1C 6.1 (H) 09/29/2022   HGBA1C 6.0 (H) 12/09/2020   HGBA1C 6.3 (H) 07/24/2020    Cardiology manages daily Farxiga 10mg   3. Hyponatremia He denies confusion, dizziness, or HA He is on daily Chlorthalidone 25mg  1/2 tab every day   Assessment/Plan:   1. Vitamin D deficiency Continue weekly Ergocalciferol per PCP  2. Prediabetes Continue daily Farxiga 10mg   3. Hyponatremia Continue to monitor for sx's and labs  4. Obesity, Current BMI 34.2  [name] is currently in the action stage of change. As such, his goal is to  continue with weight loss efforts. He has agreed to the Category 2 Plan.   Exercise goals: Older adults should follow the adult guidelines. When older adults cannot meet the adult guidelines, they should be as physically active as their abilities and conditions will allow.  Older adults should do exercises that maintain or improve balance if they are at risk of falling.  Older adults should determine their level of effort for physical activity relative to their level of fitness.  Older adults with chronic conditions should understand whether and how their conditions affect their ability to do regular physical activity safely.  Behavioral modification strategies: increasing lean protein intake, decreasing simple carbohydrates, increasing vegetables, increasing water intake, no skipping meals, meal planning and cooking strategies, and planning for success.  [name] has agreed to follow-up with our clinic in 4 weeks. He was informed of the importance of frequent follow-up visits to maximize his success with intensive lifestyle modifications for his multiple health conditions.   Objective:   Blood pressure 114/67, pulse 79, temperature 98 F (36.7 C), height 5\' 7"  (1.702 m), weight 218 lb (98.9 kg), SpO2 95%. Body mass index is 34.14 kg/m.  General: Cooperative, alert, well developed, in no acute distress. HEENT: Conjunctivae and lids unremarkable. Cardiovascular: Regular rhythm.  Lungs: Normal work of breathing. Neurologic: No focal deficits.   Lab Results  Component Value Date   CREATININE 0.91 09/29/2022   BUN 18 09/29/2022   NA 135  09/29/2022   K 4.7 09/29/2022   CL 98 09/29/2022   CO2 21 09/29/2022   Lab Results  Component Value Date   ALT 13 09/29/2022   AST 20 09/29/2022   ALKPHOS 105 09/29/2022   BILITOT 0.4 09/29/2022   Lab Results  Component Value Date   HGBA1C 6.1 (H) 09/29/2022   HGBA1C 6.0 (H) 12/09/2020   HGBA1C 6.3 (H) 07/24/2020   HGBA1C 6.2 (H) 04/08/2020    HGBA1C 6.1 (H) 04/30/2013   Lab Results  Component Value Date   INSULIN 1.1 (L) 09/29/2022   INSULIN 12.3 07/24/2020   INSULIN 5.3 04/08/2020   Lab Results  Component Value Date   TSH 1.28 02/17/2022   Lab Results  Component Value Date   CHOL 157 12/09/2020   HDL 63 12/09/2020   LDLCALC 79 12/09/2020   TRIG 78 12/09/2020   CHOLHDL 2.8 07/26/2019   Lab Results  Component Value Date   VD25OH 78.7 09/29/2022   VD25OH 72.2 12/09/2020   VD25OH 53.6 07/24/2020   Lab Results  Component Value Date   WBC 9.2 03/13/2022   HGB 13.9 03/13/2022   HCT 40.4 03/13/2022   MCV 85.4 03/13/2022   PLT 230 03/13/2022   No results found for: "IRON", "TIBC", "FERRITIN"  Attestation Statements:   Reviewed by clinician on day of visit: allergies, medications, problem list, medical history, surgical history, family history, social history, and previous encounter notes.  I have reviewed the above documentation for accuracy and completeness, and I agree with the above. -  Isley Weisheit d. Melessa Cowell, NP-C

## 2022-12-02 NOTE — Telephone Encounter (Signed)
Pre-operative Risk Assessment    Patient Name: Richard [name]  DOB: 1945-09-04 MRN: [id number]   Last OV was 04/17/22 with Dr. Izora Ribas. Next OV None  Request for Surgical Clearance    Procedure:   Colonoscopy  Date of Surgery:  Clearance 12/16/22                                 Surgeon:  Dr. Charlott Rakes Surgeon's Group or Practice Name:  [facility] Phone number:  [phone] Fax number:  [phone]   Type of Clearance Requested:   - Medical  - Pharmacy:  Hold Aspirin pt will need instructions on when/if to hold   Type of Anesthesia:   Propofol   Additional requests/questions:    Signed, Zada Finders   12/02/2022, 9:12 AM

## 2022-12-02 NOTE — Telephone Encounter (Signed)
1st attempt to reach pt to schedule tele visit. Lvm (Ok'd to use provider use spot per preop APP)

## 2022-12-02 NOTE — Telephone Encounter (Signed)
Pt called back and has been scheduled for tele pre op appt 12/07/22 @ 10:40.   OK TO ADD ON PER PRE OP APP Edd Fabian, FNP.Marland KitchenCALL [phone] OR [phone], MED REC AND CONSENT ARE DONE....[facility]    Med rec and consent are done.     Patient Consent for Virtual Visit        Richard [name] has provided verbal consent on 12/02/2022 for a virtual visit (video or telephone).   CONSENT FOR VIRTUAL VISIT FOR:  Richard [name]  By participating in this virtual visit I agree to the following:  I hereby voluntarily request, consent and authorize [facility] and its employed or contracted physicians, physician assistants, nurse practitioners or other licensed health care professionals (the Practitioner), to provide me with telemedicine health care services (the "Services") as deemed necessary by the treating Practitioner. I acknowledge and consent to receive the Services by the Practitioner via telemedicine. I understand that the telemedicine visit will involve communicating with the Practitioner through live audiovisual communication technology and the disclosure of certain medical information by electronic transmission. I acknowledge that I have been given the opportunity to request an in-person assessment or other available alternative prior to the telemedicine visit and am voluntarily participating in the telemedicine visit.  I understand that I have the right to withhold or withdraw my consent to the use of telemedicine in the course of my care at any time, without affecting my right to future care or treatment, and that the Practitioner or I may terminate the telemedicine visit at any time. I understand that I have the right to inspect all information obtained and/or recorded in the course of the telemedicine visit and may receive copies of available information for a reasonable fee.  I understand that some of the potential risks of receiving the Services via telemedicine include:   Delay or interruption in medical evaluation due to technological equipment failure or disruption; Information transmitted may not be sufficient (e.g. poor resolution of images) to allow for appropriate medical decision making by the Practitioner; and/or  In rare instances, security protocols could fail, causing a breach of personal health information.  Furthermore, I acknowledge that it is my responsibility to provide information about my medical history, conditions and care that is complete and accurate to the best of my ability. I acknowledge that Practitioner's advice, recommendations, and/or decision may be based on factors not within their control, such as incomplete or inaccurate data provided by me or distortions of diagnostic images or specimens that may result from electronic transmissions. I understand that the practice of medicine is not an exact science and that Practitioner makes no warranties or guarantees regarding treatment outcomes. I acknowledge that a copy of this consent can be made available to me via my patient portal [facility] MyChart), or I can request a printed copy by calling the office of [facility].    I understand that my insurance will be billed for this visit.   I have read or had this consent read to me. I understand the contents of this consent, which adequately explains the benefits and risks of the Services being provided via telemedicine.  I have been provided ample opportunity to ask questions regarding this consent and the Services and have had my questions answered to my satisfaction. I give my informed consent for the services to be provided through the use of telemedicine in my medical care

## 2022-12-02 NOTE — Telephone Encounter (Signed)
Name: Richard [name]  DOB: Oct 08, 1945  MRN: [id number]  Primary Cardiologist: Armanda Magic, MD   Preoperative team, please contact this patient and set up a phone call appointment for further preoperative risk assessment. Please obtain consent and complete medication review. Thank you for your help.  I confirm that guidance regarding antiplatelet and oral anticoagulation therapy has been completed and, if necessary, noted below.  If necessary his ASA may be held for 5-7 days prior to his procedure. Please resume as soon as hemostasis is achieved.    Ronney Asters, NP 12/02/2022, 9:36 AM [facility]

## 2022-12-07 ENCOUNTER — Other Ambulatory Visit (HOSPITAL_COMMUNITY): Payer: Self-pay

## 2022-12-07 ENCOUNTER — Ambulatory Visit: Payer: PPO | Attending: Nurse Practitioner

## 2022-12-07 DIAGNOSIS — Z01810 Encounter for preprocedural cardiovascular examination: Secondary | ICD-10-CM

## 2022-12-07 MED ORDER — BISACODYL 5 MG PO TBEC
DELAYED_RELEASE_TABLET | ORAL | 0 refills | Status: DC
  Filled 2022-12-07: qty 8, 2d supply, fill #0

## 2022-12-07 MED ORDER — PEG 3350-KCL-NA BICARB-NACL 420 G PO SOLR
ORAL | 0 refills | Status: DC
  Filled 2022-12-07: qty 8000, 2d supply, fill #0

## 2022-12-07 NOTE — Progress Notes (Signed)
Virtual Visit via Telephone Note   Because of Richard [name]'s co-morbid illnesses, he is at least at moderate risk for complications without adequate follow up.  This format is felt to be most appropriate for this patient at this time.  The patient did not have access to video technology/had technical difficulties with video requiring transitioning to audio format only (telephone).  All issues noted in this document were discussed and addressed.  No physical exam could be performed with this format.  Please refer to the patient's chart for his consent to telehealth for [facility].  Evaluation Performed:  Preoperative cardiovascular risk assessment _____________   Date:  12/07/2022   Patient ID:  Richard [name], DOB 02-Jul-1945, MRN [id number] Patient Location:  Home Provider location:   Office  Primary Care Provider:  Corine Shelter, MD Primary Cardiologist:  Armanda Magic, MD  Chief Complaint / Patient Profile   77 y.o. y/o male with a h/o CAD with MI in 2004, LCX PCI in 2004 with ischemia and VF. HLD, HTN, and OSA on CPAP  who is pending colonoscopy and presents today for telephonic preoperative cardiovascular risk assessment.  History of Present Illness    Richard [name] is a 77 y.o. male who presents via audio/video conferencing for a telehealth visit today.  Pt was last seen in cardiology clinic on 04/17/2022 by Dr. Raynelle Jan.  At that time Richard [name] was doing well with no new cardiac complaints. The patient is now pending procedure as outlined above. Since his last visit, he has been doing well with no new cardiac complaints.  He denies chest pain, shortness of breath, lower extremity edema, fatigue, palpitations, melena, hematuria, hemoptysis, diaphoresis, weakness, presyncope, syncope, orthopnea, and PND.    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 Katrinka Blazing, at [place], yearly visits   OSA treated with BiPAP 04/24/2018   Mild OSA with an AHI of 13.5/hr and O2 sats of 88%. Now on BiPAP at 13/9cm H2O.    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   Prediabetes 04/09/2020   Pure hypercholesterolemia    Right bundle branch block 06/27/2014    EKG performed 06/27/2014   Vitamin D deficiency    Past Surgical History:  Procedure Laterality Date   CARDIAC CATHETERIZATION  2004   with 2 stents   CARDIOVASCULAR STRESS TEST  2011   COLONOSCOPY     POLYPECTOMY  02/17/2011   Procedure: POLYPECTOMY NASAL;  Surgeon: Barbee Cough;  Location: [facility] OR;  Service: ENT;  Laterality: N/A;   SINUS ENDO W/FUSION  02/17/2011   Procedure: ENDOSCOPIC SINUS  SURGERY WITH FUSION NAVIGATION;  Surgeon: Barbee Cough;  Location: [facility] OR;  Service: ENT;  Laterality: N/A;   TONSILLECTOMY     US ECHOCARDIOGRAPHY      Allergies  Allergies  Allergen Reactions   Beef-Derived Products     *difficulty breathing*   Chicken Protein     *congestion*   Lisinopril Swelling    Angioedema    Peanut-Containing Drug Products     *congestion*    Home Medications    Prior to Admission medications   Medication  Sig Start Date End Date Taking? Authorizing Provider  amLODipine (NORVASC) 5 MG tablet Take 1 tablet (5 mg total) by mouth daily. 01/01/22     aspirin EC 81 MG tablet Take 1 tablet (81 mg total) by mouth daily. 04/22/17   Berton Bon, NP  atorvastatin (LIPITOR) 80 MG tablet Take 1 tablet (80 mg total) by mouth at bedtime. 12/15/21     carvedilol (COREG) 12.5 MG tablet Take 1 tablet (12.5 mg total) by mouth 2 (two) times daily. 07/06/22   Christell Constant, MD  chlorthalidone (HYGROTON) 25 MG tablet Take 1/2 tablet (12.5 mg total) by mouth daily. 04/27/22   Quintella Reichert, MD  COVID-19 mRNA Vac-TriS, Pfizer, (PFIZER-BIONT COVID-19 VAC-TRIS) SUSP injection Inject into the muscle. 09/24/20   Judyann Munson, MD  Cyanocobalamin (VITAMIN B 12 PO) Take by mouth.    [provider]  dapagliflozin propanediol (FARXIGA) 10 MG TABS tablet Take 1 tablet (10 mg total) by mouth daily before breakfast. 06/15/22   Quintella Reichert, MD  ezetimibe (ZETIA) 10 MG tablet Take 1 tablet (10 mg total) by mouth daily. 02/05/22     fluticasone furoate-vilanterol (BREO ELLIPTA) 200-25 MCG/ACT AEPB Inhale 1 puff into the lungs daily. 09/29/21     guaiFENesin (MUCINEX) 600 MG 12 hr tablet Take by mouth 2 (two) times daily.    [provider]  ipratropium-albuterol (DUONEB) 0.5-2.5 (3) MG/3ML SOLN Inhale 3 mLs by nebulization 2 (two) times daily and once at bedtime as needed. 04/07/22   Corine Shelter, MD  montelukast (SINGULAIR) 10 MG tablet Take 1 tablet by mouth daily 09/21/22     nitroGLYCERIN (NITROSTAT) 0.4 MG SL tablet Place 1 tablet (0.4 mg total) under the tongue every 5 (five) minutes as needed. For chest pain 04/27/22   Quintella Reichert, MD  omeprazole (PRILOSEC) 40 MG capsule Take 1 capsule (40 mg total) by mouth 2 (two) times daily. 01/16/21     omeprazole (PRILOSEC) 40 MG capsule Take 1 capsule (40 mg total) by mouth 2 (two) times daily. 07/07/22     polyethylene glycol powder (GLYCOLAX/MIRALAX)  17 GM/SCOOP powder See admin instructions.    [provider]  polyethylene glycol-electrolytes (NULYTELY) 420 g solution Use as directed 03/21/21     potassium chloride (MICRO-K) 10 MEQ CR capsule Take 1 capsule (10 mEq total) by mouth daily. 12/15/21     tamsulosin (FLOMAX) 0.4 MG CAPS capsule Take 1 capsule (0.4 mg total) by mouth daily. 04/07/22   Corine Shelter, MD  Vitamin D, Ergocalciferol, 50000 units CAPS Take 1 capsule by mouth weekly 09/21/22       Physical Exam    Vital Signs:  Richard [name] does not have vital signs available for review today.  Given telephonic nature of communication, physical exam is limited. AAOx3. NAD. Normal affect.  Speech and respirations are unlabored.  Accessory Clinical Findings    None  Assessment & Plan    1.  Preoperative Cardiovascular Risk Assessment: -Patient's RCRI score is 0.9% The patient affirms he has been doing well without any new cardiac symptoms. They are able to achieve 7 METS without cardiac limitations. Therefore, based on ACC/AHA guidelines, the patient would be at acceptable risk for the planned procedure without further cardiovascular testing. The patient was advised that if he develops new symptoms prior to surgery to contact our office to arrange for a follow-up visit, and he verbalized understanding.   The patient was advised that if he develops new symptoms prior to surgery to contact our office to arrange for a follow-up visit, and he verbalized understanding.  Patient can hold aspirin 7 days prior to procedure and should restart postprocedure when surgically safe and hemostasis is achieved.  A copy of this note will be routed to requesting surgeon.  Time:   Today, I have spent 6 minutes with the patient with telehealth technology discussing medical history, symptoms, and management plan.     Napoleon Form, Leodis Rains, NP  12/07/2022, 7:44 AM

## 2022-12-08 ENCOUNTER — Other Ambulatory Visit (HOSPITAL_COMMUNITY): Payer: Self-pay

## 2022-12-14 ENCOUNTER — Other Ambulatory Visit: Payer: Self-pay

## 2022-12-14 ENCOUNTER — Other Ambulatory Visit (HOSPITAL_COMMUNITY): Payer: Self-pay

## 2022-12-16 DIAGNOSIS — Z09 Encounter for follow-up examination after completed treatment for conditions other than malignant neoplasm: Secondary | ICD-10-CM | POA: Diagnosis not present

## 2022-12-16 DIAGNOSIS — Z860100 Personal history of colon polyps, unspecified: Secondary | ICD-10-CM | POA: Diagnosis not present

## 2022-12-16 DIAGNOSIS — D122 Benign neoplasm of ascending colon: Secondary | ICD-10-CM | POA: Diagnosis not present

## 2022-12-16 DIAGNOSIS — D124 Benign neoplasm of descending colon: Secondary | ICD-10-CM | POA: Diagnosis not present

## 2022-12-16 DIAGNOSIS — K648 Other hemorrhoids: Secondary | ICD-10-CM | POA: Diagnosis not present

## 2022-12-16 DIAGNOSIS — K5730 Diverticulosis of large intestine without perforation or abscess without bleeding: Secondary | ICD-10-CM | POA: Diagnosis not present

## 2022-12-18 DIAGNOSIS — D122 Benign neoplasm of ascending colon: Secondary | ICD-10-CM | POA: Diagnosis not present

## 2022-12-18 DIAGNOSIS — D124 Benign neoplasm of descending colon: Secondary | ICD-10-CM | POA: Diagnosis not present

## 2023-01-03 ENCOUNTER — Other Ambulatory Visit (HOSPITAL_COMMUNITY): Payer: Self-pay

## 2023-01-04 ENCOUNTER — Other Ambulatory Visit (HOSPITAL_COMMUNITY): Payer: Self-pay

## 2023-01-04 MED ORDER — AMLODIPINE BESYLATE 5 MG PO TABS
5.0000 mg | ORAL_TABLET | Freq: Every day | ORAL | 3 refills | Status: DC
  Filled 2023-01-04: qty 90, 90d supply, fill #0
  Filled 2023-04-04: qty 90, 90d supply, fill #1
  Filled 2023-06-28: qty 90, 90d supply, fill #2
  Filled 2023-10-02: qty 90, 90d supply, fill #3

## 2023-01-05 ENCOUNTER — Other Ambulatory Visit (HOSPITAL_COMMUNITY): Payer: Self-pay

## 2023-01-05 ENCOUNTER — Ambulatory Visit (INDEPENDENT_AMBULATORY_CARE_PROVIDER_SITE_OTHER): Payer: PPO | Admitting: Adult Health

## 2023-01-05 ENCOUNTER — Encounter (INDEPENDENT_AMBULATORY_CARE_PROVIDER_SITE_OTHER): Payer: Self-pay | Admitting: Adult Health

## 2023-01-05 VITALS — BP 119/63 | HR 72 | Temp 98.3°F | Ht 67.0 in | Wt 212.0 lb

## 2023-01-05 DIAGNOSIS — I10 Essential (primary) hypertension: Secondary | ICD-10-CM | POA: Diagnosis not present

## 2023-01-05 DIAGNOSIS — E669 Obesity, unspecified: Secondary | ICD-10-CM

## 2023-01-05 DIAGNOSIS — R7303 Prediabetes: Secondary | ICD-10-CM

## 2023-01-05 DIAGNOSIS — Z6834 Body mass index (BMI) 34.0-34.9, adult: Secondary | ICD-10-CM | POA: Diagnosis not present

## 2023-01-05 DIAGNOSIS — E66812 Obesity, class 2: Secondary | ICD-10-CM

## 2023-01-05 NOTE — Progress Notes (Signed)
WEIGHT SUMMARY AND BIOMETRICS  Vitals Temp: 98.3 F (36.8 C) BP: 119/63 Pulse Rate: 72 SpO2: 97 %   Anthropometric Measurements Height: 5\' 7"  (1.702 m) Weight: 212 lb (96.2 kg) BMI (Calculated): 33.2 Weight at Last Visit: 218 lb Weight Lost Since Last Visit: 6 lb Weight Gained Since Last Visit: 0 Starting Weight: 258 lb Total Weight Loss (lbs): 46 lb (20.9 kg)   Body Composition  Body Fat %: 32.2 % Fat Mass (lbs): 68.4 lbs Muscle Mass (lbs): 137 lbs Total Body Water (lbs): 93.8 lbs Visceral Fat Rating : 21   Other Clinical Data Fasting: No Labs: No Today's Visit #: 35 Starting Date: 04/08/20    Chief Complaint:   OBESITY Richard [name] is here to discuss his progress with his obesity treatment plan. He is on the the Category 2 Plan and states he is following his eating plan approximately 70 % of the time. He states he is exercising Golf 4 hrs 1 x week.   Interim History:  Reviewed Bioimpedance results with pt: Muscle Mass:-2.2 lbs Adipose Mass: -3.8 lbs  Sleep- he reports 97% compliance with nightly CPAP.  He reports adequate energy during day. Exercise-GOLF! Hydration-he estimates to drink at least 48 oz plain water/day  Subjective:   1. Prediabetes Lab Results  Component Value Date   HGBA1C 6.1 (H) 09/29/2022   HGBA1C 6.0 (H) 12/09/2020   HGBA1C 6.3 (H) 07/24/2020    Cardiology manages daily Marcelline Deist 10mg  - 2023: Started of Farxiga for HFpEF and improvement in symptoms.   2. Essential hypertension BP at goal at OV He rarely checks readings at home, when he doe SBP 110-120s DBP 60-70s He is currently on: aspirin EC 81 MG tablet  atorvastatin (LIPITOR) 80 MG tablet  ezetimibe (ZETIA) 10 MG tablet  chlorthalidone (HYGROTON) 25 MG tablet  nitroGLYCERIN (NITROSTAT) 0.4 MG SL tablet  carvedilol (COREG) 12.5 MG tablet  amLODipine (NORVASC) 5 MG tablet   Assessment/Plan:   1. Prediabetes Continue Cat 2 meal plan, regular exercise and Farxiga  10mg  daily   2. Essential hypertension Continue Cat 2 meal plan and regular exercise  Continue aspirin EC 81 MG tablet  atorvastatin (LIPITOR) 80 MG tablet  ezetimibe (ZETIA) 10 MG tablet  chlorthalidone (HYGROTON) 25 MG tablet  nitroGLYCERIN (NITROSTAT) 0.4 MG SL tablet  carvedilol (COREG) 12.5 MG tablet  amLODipine (NORVASC) 5 MG tablet   3. Obesity, Current BMI 34.2  Richard [name] is currently in the action stage of change. As such, his goal is to continue with weight loss efforts. He has agreed to the Category 2 Plan.   Exercise goals: Older adults should follow the adult guidelines. When older adults cannot meet the adult guidelines, they should be as physically active as their abilities and conditions will allow.  Older adults should do exercises that maintain or improve balance if they are at risk of falling.  Older adults should determine their level of effort for physical activity relative to their level of fitness.  Older adults with chronic conditions should understand whether and how their conditions affect their ability to do regular physical activity safely.  Behavioral modification strategies: increasing lean protein intake, decreasing simple carbohydrates, increasing vegetables, increasing water intake, decreasing eating out, no skipping meals, meal planning and cooking strategies, keeping healthy foods in the home, and planning for success.  Richard [name] has agreed to follow-up with our clinic in 4 weeks. He was informed of the importance of frequent follow-up visits to maximize his success with intensive lifestyle  modifications for his multiple health conditions.   Objective:   Blood pressure 119/63, pulse 72, temperature 98.3 F (36.8 C), height 5\' 7"  (1.702 m), weight 212 lb (96.2 kg), SpO2 97%. Body mass index is 33.2 kg/m.  General: Cooperative, alert, well developed, in no acute distress. HEENT: Conjunctivae and lids unremarkable. Cardiovascular: Regular rhythm.  Lungs:  Normal work of breathing. Neurologic: No focal deficits.   Lab Results  Component Value Date   CREATININE 0.91 09/29/2022   BUN 18 09/29/2022   NA 135 09/29/2022   K 4.7 09/29/2022   CL 98 09/29/2022   CO2 21 09/29/2022   Lab Results  Component Value Date   ALT 13 09/29/2022   AST 20 09/29/2022   ALKPHOS 105 09/29/2022   BILITOT 0.4 09/29/2022   Lab Results  Component Value Date   HGBA1C 6.1 (H) 09/29/2022   HGBA1C 6.0 (H) 12/09/2020   HGBA1C 6.3 (H) 07/24/2020   HGBA1C 6.2 (H) 04/08/2020   HGBA1C 6.1 (H) 04/30/2013   Lab Results  Component Value Date   INSULIN 1.1 (L) 09/29/2022   INSULIN 12.3 07/24/2020   INSULIN 5.3 04/08/2020   Lab Results  Component Value Date   TSH 1.28 02/17/2022   Lab Results  Component Value Date   CHOL 157 12/09/2020   HDL 63 12/09/2020   LDLCALC 79 12/09/2020   TRIG 78 12/09/2020   CHOLHDL 2.8 07/26/2019   Lab Results  Component Value Date   VD25OH 78.7 09/29/2022   VD25OH 72.2 12/09/2020   VD25OH 53.6 07/24/2020   Lab Results  Component Value Date   WBC 9.2 03/13/2022   HGB 13.9 03/13/2022   HCT 40.4 03/13/2022   MCV 85.4 03/13/2022   PLT 230 03/13/2022   No results found for: "IRON", "TIBC", "FERRITIN"  Attestation Statements:   Reviewed by clinician on day of visit: allergies, medications, problem list, medical history, surgical history, family history, social history, and previous encounter notes.  Time spent on visit including pre-visit chart review and post-visit care and charting was 28 minutes.   I have reviewed the above documentation for accuracy and completeness, and I agree with the above. -  Jahni Nazar d. Carmen Vallecillo, NP-C

## 2023-01-08 ENCOUNTER — Other Ambulatory Visit (HOSPITAL_BASED_OUTPATIENT_CLINIC_OR_DEPARTMENT_OTHER): Payer: Self-pay

## 2023-01-18 ENCOUNTER — Other Ambulatory Visit (HOSPITAL_COMMUNITY): Payer: Self-pay

## 2023-01-31 ENCOUNTER — Other Ambulatory Visit (HOSPITAL_COMMUNITY): Payer: Self-pay

## 2023-02-01 ENCOUNTER — Other Ambulatory Visit (HOSPITAL_COMMUNITY): Payer: Self-pay

## 2023-02-01 ENCOUNTER — Ambulatory Visit (INDEPENDENT_AMBULATORY_CARE_PROVIDER_SITE_OTHER): Payer: PPO | Admitting: Adult Health

## 2023-02-01 ENCOUNTER — Encounter (INDEPENDENT_AMBULATORY_CARE_PROVIDER_SITE_OTHER): Payer: Self-pay | Admitting: Adult Health

## 2023-02-01 VITALS — BP 129/74 | HR 74 | Temp 98.1°F | Ht 67.0 in | Wt 212.0 lb

## 2023-02-01 DIAGNOSIS — E669 Obesity, unspecified: Secondary | ICD-10-CM | POA: Diagnosis not present

## 2023-02-01 DIAGNOSIS — R7303 Prediabetes: Secondary | ICD-10-CM

## 2023-02-01 DIAGNOSIS — Z6833 Body mass index (BMI) 33.0-33.9, adult: Secondary | ICD-10-CM

## 2023-02-01 DIAGNOSIS — E66812 Obesity, class 2: Secondary | ICD-10-CM

## 2023-02-01 DIAGNOSIS — I5032 Chronic diastolic (congestive) heart failure: Secondary | ICD-10-CM | POA: Diagnosis not present

## 2023-02-01 MED ORDER — IPRATROPIUM-ALBUTEROL 0.5-2.5 (3) MG/3ML IN SOLN
3.0000 mL | Freq: Two times a day (BID) | RESPIRATORY_TRACT | 4 refills | Status: DC
  Filled 2023-02-01: qty 360, 40d supply, fill #0
  Filled 2023-02-01: qty 360, 60d supply, fill #0
  Filled 2023-04-04: qty 360, 60d supply, fill #1
  Filled 2023-06-07: qty 360, 60d supply, fill #2
  Filled 2023-08-01: qty 360, 60d supply, fill #3
  Filled 2023-10-02: qty 360, 60d supply, fill #4

## 2023-02-01 NOTE — Progress Notes (Signed)
WEIGHT SUMMARY AND BIOMETRICS  Vitals Temp: 98.1 F (36.7 C) BP: 129/74 Pulse Rate: 74 SpO2: 97 %   Anthropometric Measurements Height: 5\' 7"  (1.702 m) Weight: 212 lb (96.2 kg) BMI (Calculated): 33.2 Weight at Last Visit: 212lb Weight Lost Since Last Visit: 0 Weight Gained Since Last Visit: 0 Starting Weight: 258lb Total Weight Loss (lbs): 46 lb (20.9 kg)   Body Composition  Body Fat %: 32.6 % Fat Mass (lbs): 69.2 lbs Muscle Mass (lbs): 135.8 lbs Total Body Water (lbs): 93.4 lbs Visceral Fat Rating : 21   Other Clinical Data Fasting: no Labs: no Today's Visit #: 36 Starting Date: 04/08/20    Chief Complaint:   Richard [name] Richard [name] is here to discuss his progress with his Richard [name] treatment plan. He is on the the Category 2 Plan and states he is following his eating plan approximately 70 % of the time.  He states he is exercising NEAT Activities.  Interim History:  He will celebrate Thanksgiving with 15-20 family/friends- holiday strategy hand out provided to pt.  Exercise-he is finished with golf until March 2024.  He reports lack of interest in regular exercise  Hydration-he estimates to drink >48 oz water/day  He is followed by PCP Q3-63M He is followed by cards annually  Subjective:   1. Chronic heart failure with preserved ejection fraction (HFpEF) (HCC) 04/17/2022 Cards OV Notes JAMEER DIAB is a 77 y.o. male with a hx of CAD with MI in 2004, LCX PCI in 2004 with ischemia and VF.  HLD, HTN, and OSA on CPAP.  Has Asthma.  Has OSA and sees Dr. Mayford Knife. 2023: Started of Farxiga for HFpEF and improvement in symptoms. Chronic HFpEF - NYHA I, Stage B, euvolemic - sx resolves with SGLT2i in 2023; will repeat echo (last 2019) - continue CTZ  2. Prediabetes Lab Results  Component Value Date   HGBA1C 6.1 (H) 09/29/2022   HGBA1C 6.0 (H) 12/09/2020   HGBA1C 6.3 (H) 07/24/2020    He is on daily Farxiga 10mg - managed by Cards/Dr. Christell Constant, MD   Assessment/Plan:   1. Chronic heart failure with preserved ejection fraction (HFpEF) (HCC) Continue yearly f/u with Cards/Dr. Christell Constant, MD   2. Prediabetes Continue prescribed Cat 2 Meal Plan  3. Richard [name], Current BMI 33.2  Richard [name] is currently in the action stage of change. As such, his goal is to continue with weight loss efforts. He has agreed to the Category 2 Plan.   Exercise goals: Older adults should follow the adult guidelines. When older adults cannot meet the adult guidelines, they should be as physically active as their abilities and conditions will allow.  Older adults should do exercises that maintain or improve balance if they are at risk of falling.  Older adults should determine their level of effort for physical activity relative to their level of fitness.  Older adults with chronic conditions should understand whether and how their conditions affect their ability to do regular physical activity safely.  Behavioral modification strategies: increasing lean protein intake, decreasing simple carbohydrates, increasing vegetables, increasing water intake, meal planning and cooking strategies, keeping healthy foods in the home, ways to avoid boredom eating, and planning for success.  Layden has agreed to follow-up with our clinic in 4 weeks. He was informed of the importance of frequent follow-up visits to maximize his success with intensive lifestyle modifications for his multiple health conditions.   Objective:   Blood pressure 129/74, pulse 74, temperature 98.1  F (36.7 C), height 5\' 7"  (1.702 m), weight 212 lb (96.2 kg), SpO2 97%. Body mass index is 33.2 kg/m.  General: Cooperative, alert, well developed, in no acute distress. HEENT: Conjunctivae and lids unremarkable. Cardiovascular: Regular rhythm.  Lungs: Normal work of breathing. Neurologic: No focal deficits.   Lab Results  Component Value Date   CREATININE 0.91 09/29/2022   BUN 18  09/29/2022   NA 135 09/29/2022   K 4.7 09/29/2022   CL 98 09/29/2022   CO2 21 09/29/2022   Lab Results  Component Value Date   ALT 13 09/29/2022   AST 20 09/29/2022   ALKPHOS 105 09/29/2022   BILITOT 0.4 09/29/2022   Lab Results  Component Value Date   HGBA1C 6.1 (H) 09/29/2022   HGBA1C 6.0 (H) 12/09/2020   HGBA1C 6.3 (H) 07/24/2020   HGBA1C 6.2 (H) 04/08/2020   HGBA1C 6.1 (H) 04/30/2013   Lab Results  Component Value Date   INSULIN 1.1 (L) 09/29/2022   INSULIN 12.3 07/24/2020   INSULIN 5.3 04/08/2020   Lab Results  Component Value Date   TSH 1.28 02/17/2022   Lab Results  Component Value Date   CHOL 157 12/09/2020   HDL 63 12/09/2020   LDLCALC 79 12/09/2020   TRIG 78 12/09/2020   CHOLHDL 2.8 07/26/2019   Lab Results  Component Value Date   VD25OH 78.7 09/29/2022   VD25OH 72.2 12/09/2020   VD25OH 53.6 07/24/2020   Lab Results  Component Value Date   WBC 9.2 03/13/2022   HGB 13.9 03/13/2022   HCT 40.4 03/13/2022   MCV 85.4 03/13/2022   PLT 230 03/13/2022   No results found for: "IRON", "TIBC", "FERRITIN"  Attestation Statements:   Reviewed by clinician on day of visit: allergies, medications, problem list, medical history, surgical history, family history, social history, and previous encounter notes.  Time spent on visit including pre-visit chart review and post-visit care and charting was 26 minutes.   I have reviewed the above documentation for accuracy and completeness, and I agree with the above. -  Jylian Pappalardo d. Damar Petit, NP-C

## 2023-02-02 ENCOUNTER — Other Ambulatory Visit: Payer: Self-pay

## 2023-02-02 ENCOUNTER — Ambulatory Visit (INDEPENDENT_AMBULATORY_CARE_PROVIDER_SITE_OTHER): Payer: PPO | Admitting: Adult Health

## 2023-02-02 DIAGNOSIS — H524 Presbyopia: Secondary | ICD-10-CM | POA: Diagnosis not present

## 2023-02-02 DIAGNOSIS — H2512 Age-related nuclear cataract, left eye: Secondary | ICD-10-CM | POA: Diagnosis not present

## 2023-02-02 DIAGNOSIS — H5213 Myopia, bilateral: Secondary | ICD-10-CM | POA: Diagnosis not present

## 2023-02-07 ENCOUNTER — Other Ambulatory Visit (HOSPITAL_COMMUNITY): Payer: Self-pay

## 2023-02-09 ENCOUNTER — Other Ambulatory Visit (HOSPITAL_COMMUNITY): Payer: Self-pay

## 2023-02-09 MED ORDER — EZETIMIBE 10 MG PO TABS
10.0000 mg | ORAL_TABLET | Freq: Every day | ORAL | 3 refills | Status: DC
  Filled 2023-02-09: qty 90, 90d supply, fill #0
  Filled 2023-05-10: qty 90, 90d supply, fill #1
  Filled 2023-08-06: qty 90, 90d supply, fill #2
  Filled 2023-11-14: qty 90, 90d supply, fill #3

## 2023-02-15 ENCOUNTER — Other Ambulatory Visit (HOSPITAL_COMMUNITY): Payer: Self-pay

## 2023-02-15 ENCOUNTER — Other Ambulatory Visit: Payer: Self-pay

## 2023-03-13 ENCOUNTER — Other Ambulatory Visit: Payer: Self-pay | Admitting: Cardiology

## 2023-03-13 ENCOUNTER — Other Ambulatory Visit (HOSPITAL_COMMUNITY): Payer: Self-pay

## 2023-03-15 ENCOUNTER — Other Ambulatory Visit (HOSPITAL_COMMUNITY): Payer: Self-pay

## 2023-03-15 ENCOUNTER — Other Ambulatory Visit: Payer: Self-pay

## 2023-03-15 MED ORDER — ATORVASTATIN CALCIUM 80 MG PO TABS
80.0000 mg | ORAL_TABLET | Freq: Every day | ORAL | 4 refills | Status: DC
  Filled 2023-03-15: qty 90, 90d supply, fill #0
  Filled 2023-06-07: qty 90, 90d supply, fill #1
  Filled 2023-09-03: qty 90, 90d supply, fill #2
  Filled 2023-12-06: qty 90, 90d supply, fill #3
  Filled 2024-03-05: qty 90, 90d supply, fill #4

## 2023-03-15 MED ORDER — POTASSIUM CHLORIDE ER 10 MEQ PO CPCR
10.0000 meq | ORAL_CAPSULE | Freq: Every day | ORAL | 4 refills | Status: DC
  Filled 2023-03-15: qty 90, 90d supply, fill #0
  Filled 2023-06-07: qty 90, 90d supply, fill #1
  Filled 2023-09-10: qty 90, 90d supply, fill #2
  Filled 2023-12-06: qty 90, 90d supply, fill #3
  Filled 2024-03-05: qty 90, 90d supply, fill #4

## 2023-03-16 ENCOUNTER — Encounter (INDEPENDENT_AMBULATORY_CARE_PROVIDER_SITE_OTHER): Payer: Self-pay | Admitting: Adult Health

## 2023-03-16 ENCOUNTER — Ambulatory Visit (INDEPENDENT_AMBULATORY_CARE_PROVIDER_SITE_OTHER): Payer: PPO | Admitting: Adult Health

## 2023-03-16 ENCOUNTER — Other Ambulatory Visit (HOSPITAL_COMMUNITY): Payer: Self-pay

## 2023-03-16 VITALS — BP 137/67 | HR 78 | Temp 98.0°F | Ht 67.0 in | Wt 220.0 lb

## 2023-03-16 DIAGNOSIS — Z6834 Body mass index (BMI) 34.0-34.9, adult: Secondary | ICD-10-CM

## 2023-03-16 DIAGNOSIS — E785 Hyperlipidemia, unspecified: Secondary | ICD-10-CM | POA: Diagnosis not present

## 2023-03-16 DIAGNOSIS — I10 Essential (primary) hypertension: Secondary | ICD-10-CM

## 2023-03-16 DIAGNOSIS — E669 Obesity, unspecified: Secondary | ICD-10-CM

## 2023-03-16 MED ORDER — DAPAGLIFLOZIN PROPANEDIOL 10 MG PO TABS
10.0000 mg | ORAL_TABLET | Freq: Every day | ORAL | 3 refills | Status: DC
  Filled 2023-03-16: qty 90, 90d supply, fill #0
  Filled 2023-06-16: qty 90, 90d supply, fill #1
  Filled 2023-09-10: qty 90, 90d supply, fill #2
  Filled 2023-12-13: qty 90, 90d supply, fill #3

## 2023-03-16 NOTE — Progress Notes (Signed)
 WEIGHT SUMMARY AND BIOMETRICS  Vitals Temp: 98 F (36.7 C) BP: 137/67 Pulse Rate: 78 SpO2: 97 %   Anthropometric Measurements Height: 5' 7 (1.702 m) Weight: 220 lb (99.8 kg) BMI (Calculated): 34.45 Weight at Last Visit: 212lb Weight Lost Since Last Visit: 0 Weight Gained Since Last Visit: 8lb Starting Weight: 258lb Total Weight Loss (lbs): 38 lb (17.2 kg)   Body Composition  Body Fat %: 33.1 % Fat Mass (lbs): 73 lbs Muscle Mass (lbs): 140 lbs Total Body Water (lbs): 95.4 lbs Visceral Fat Rating : 22   Other Clinical Data Fasting: no Labs: no Today's Visit #: 37 Starting Date: 04/08/20    Chief Complaint:   OBESITY Richard [name] is here to discuss his progress with his obesity treatment plan. He is on the the Category 2 Plan and states he is following his eating plan approximately 65 % of the time.  He states he is exercising: NEAT Activities           Golf Season will resume March-April           Season starts of with 3 day golf trip to [facility] in March   Interim History:  [name] travelled to [place]  and [place]  over the holiday season. He reports increased physical activity during the interstate travel.  Reviewed Bioimpedance results with pt: Muscle Mass: +4.2 lbs Adipose Mass: +3.8 lbs  Mr. Mori will have f/u with PCP/Dr. Hillman this month. He anticipates labs will completed at PCP OV.  Current weight 220 lbs with corresponding BI 34.5 Goal weight 195 lbs with corresponding BMI 30.5  Subjective:   1. Hyperlipidemia, unspecified hyperlipidemia type Lipid Panel     Component Value Date/Time   CHOL 157 12/09/2020 1046   TRIG 78 12/09/2020 1046   HDL 63 12/09/2020 1046   CHOLHDL 2.8 07/26/2019 1142   LDLCALC 79 12/09/2020 1046   LABVLDL 15 12/09/2020 1046   The ASCVD Risk score (Arnett DK, et al., 2019) failed to calculate for the following reasons:   Risk score cannot be calculated because patient has a medical history  suggesting prior/existing ASCVD   Hx of CAD with MI in 2004, LCX PCI in 2004 with ischemia and VF.  He is on ezetimibe  (ZETIA ) 10 MG tablet  atorvastatin  (LIPITOR) 80 MG tablet   2. Essential hypertension BP stable at OV Hx of CAD with MI in 2004, LCX PCI in 2004 with ischemia and VF.   HLD, HTN, and OSA on CPAP.   Hx of  Asthma. 2023: Started of Farxiga  for HFpEF and improvement in symptoms.  Assessment/Plan:   1. Hyperlipidemia, unspecified hyperlipidemia type Reduce saturated fat Remain active everyday Continue ezetimibe  (ZETIA ) 10 MG tablet  atorvastatin  (LIPITOR) 80 MG tablet   2. Essential hypertension (Primary) Continue aspirin  EC 81 MG tablet  chlorthalidone  (HYGROTON ) 25 MG tablet  nitroGLYCERIN  (NITROSTAT ) 0.4 MG SL tablet  carvedilol  (COREG ) 12.5 MG tablet  amLODipine  (NORVASC ) 5 MG tablet  ezetimibe  (ZETIA ) 10 MG tablet  atorvastatin  (LIPITOR) 80 MG tablet   3. Obesity, Current BMI 34.45  Richard [name] is currently in the action stage of change. As such, his goal is to continue with weight loss efforts. He has agreed to the Category 2 Plan.   Exercise goals: Older adults should follow the adult guidelines. When older adults cannot meet the adult guidelines, they should be as physically active as their abilities and conditions will allow.  Older adults should do exercises that maintain or improve  balance if they are at risk of falling.  Older adults should determine their level of effort for physical activity relative to their level of fitness.  Older adults with chronic conditions should understand whether and how their conditions affect their ability to do regular physical activity safely.  Behavioral modification strategies: increasing lean protein intake, decreasing simple carbohydrates, increasing vegetables, increasing water intake, no skipping meals, meal planning and cooking strategies, keeping healthy foods in the home, ways to avoid boredom eating, and planning for  success.  [name] has agreed to follow-up with our clinic in 4 weeks. He was informed of the importance of frequent follow-up visits to maximize his success with intensive lifestyle modifications for his multiple health conditions.   Objective:   Blood pressure 137/67, pulse 78, temperature 98 F (36.7 C), height 5' 7 (1.702 m), weight 220 lb (99.8 kg), SpO2 97%. Body mass index is 34.46 kg/m.  General: Cooperative, alert, well developed, in no acute distress. HEENT: Conjunctivae and lids unremarkable. Cardiovascular: Regular rhythm.  Lungs: Normal work of breathing. Neurologic: No focal deficits.   Lab Results  Component Value Date   CREATININE 0.91 09/29/2022   BUN 18 09/29/2022   NA 135 09/29/2022   K 4.7 09/29/2022   CL 98 09/29/2022   CO2 21 09/29/2022   Lab Results  Component Value Date   ALT 13 09/29/2022   AST 20 09/29/2022   ALKPHOS 105 09/29/2022   BILITOT 0.4 09/29/2022   Lab Results  Component Value Date   HGBA1C 6.1 (H) 09/29/2022   HGBA1C 6.0 (H) 12/09/2020   HGBA1C 6.3 (H) 07/24/2020   HGBA1C 6.2 (H) 04/08/2020   HGBA1C 6.1 (H) 04/30/2013   Lab Results  Component Value Date   INSULIN  1.1 (L) 09/29/2022   INSULIN  12.3 07/24/2020   INSULIN  5.3 04/08/2020   Lab Results  Component Value Date   TSH 1.28 02/17/2022   Lab Results  Component Value Date   CHOL 157 12/09/2020   HDL 63 12/09/2020   LDLCALC 79 12/09/2020   TRIG 78 12/09/2020   CHOLHDL 2.8 07/26/2019   Lab Results  Component Value Date   VD25OH 78.7 09/29/2022   VD25OH 72.2 12/09/2020   VD25OH 53.6 07/24/2020   Lab Results  Component Value Date   WBC 9.2 03/13/2022   HGB 13.9 03/13/2022   HCT 40.4 03/13/2022   MCV 85.4 03/13/2022   PLT 230 03/13/2022   No results found for: IRON, TIBC, FERRITIN  Attestation Statements:   Reviewed by clinician on day of visit: allergies, medications, problem list, medical history, surgical history, family history, social history, and  previous encounter notes.  I have reviewed the above documentation for accuracy and completeness, and I agree with the above. -  Ashana Tullo d. Shean Gerding, NP-C

## 2023-03-19 DIAGNOSIS — G4733 Obstructive sleep apnea (adult) (pediatric): Secondary | ICD-10-CM | POA: Diagnosis not present

## 2023-03-23 ENCOUNTER — Other Ambulatory Visit (HOSPITAL_COMMUNITY): Payer: Self-pay

## 2023-03-23 DIAGNOSIS — R7302 Impaired glucose tolerance (oral): Secondary | ICD-10-CM | POA: Diagnosis not present

## 2023-03-23 DIAGNOSIS — E559 Vitamin D deficiency, unspecified: Secondary | ICD-10-CM | POA: Diagnosis not present

## 2023-03-23 DIAGNOSIS — I2510 Atherosclerotic heart disease of native coronary artery without angina pectoris: Secondary | ICD-10-CM | POA: Diagnosis not present

## 2023-03-23 DIAGNOSIS — Z0001 Encounter for general adult medical examination with abnormal findings: Secondary | ICD-10-CM | POA: Diagnosis not present

## 2023-03-23 DIAGNOSIS — Z1331 Encounter for screening for depression: Secondary | ICD-10-CM | POA: Diagnosis not present

## 2023-03-23 DIAGNOSIS — E049 Nontoxic goiter, unspecified: Secondary | ICD-10-CM | POA: Diagnosis not present

## 2023-03-23 DIAGNOSIS — I119 Hypertensive heart disease without heart failure: Secondary | ICD-10-CM | POA: Diagnosis not present

## 2023-03-23 DIAGNOSIS — J4530 Mild persistent asthma, uncomplicated: Secondary | ICD-10-CM | POA: Diagnosis not present

## 2023-03-23 DIAGNOSIS — E7800 Pure hypercholesterolemia, unspecified: Secondary | ICD-10-CM | POA: Diagnosis not present

## 2023-03-23 DIAGNOSIS — Z125 Encounter for screening for malignant neoplasm of prostate: Secondary | ICD-10-CM | POA: Diagnosis not present

## 2023-03-23 DIAGNOSIS — J441 Chronic obstructive pulmonary disease with (acute) exacerbation: Secondary | ICD-10-CM | POA: Diagnosis not present

## 2023-03-23 DIAGNOSIS — Z79899 Other long term (current) drug therapy: Secondary | ICD-10-CM | POA: Diagnosis not present

## 2023-03-23 MED ORDER — TAMSULOSIN HCL 0.4 MG PO CAPS
0.4000 mg | ORAL_CAPSULE | Freq: Every day | ORAL | 3 refills | Status: DC
  Filled 2023-03-23: qty 90, 90d supply, fill #0
  Filled 2023-07-09: qty 90, 90d supply, fill #1
  Filled 2023-10-02: qty 90, 90d supply, fill #2
  Filled 2024-01-03: qty 90, 90d supply, fill #3

## 2023-03-26 DIAGNOSIS — E559 Vitamin D deficiency, unspecified: Secondary | ICD-10-CM | POA: Diagnosis not present

## 2023-03-26 DIAGNOSIS — Z79899 Other long term (current) drug therapy: Secondary | ICD-10-CM | POA: Diagnosis not present

## 2023-03-26 DIAGNOSIS — E7800 Pure hypercholesterolemia, unspecified: Secondary | ICD-10-CM | POA: Diagnosis not present

## 2023-03-26 DIAGNOSIS — I119 Hypertensive heart disease without heart failure: Secondary | ICD-10-CM | POA: Diagnosis not present

## 2023-03-26 DIAGNOSIS — Z125 Encounter for screening for malignant neoplasm of prostate: Secondary | ICD-10-CM | POA: Diagnosis not present

## 2023-03-26 DIAGNOSIS — R7302 Impaired glucose tolerance (oral): Secondary | ICD-10-CM | POA: Diagnosis not present

## 2023-03-26 LAB — CBC AND DIFFERENTIAL
A1c: 6.2
EGFR: 88
HCT: 45 (ref 41–53)
Hemoglobin: 14.7 (ref 13.5–17.5)
PSA, Total: 2.5
WBC: 4.5

## 2023-03-26 LAB — VITAMIN D 25 HYDROXY (VIT D DEFICIENCY, FRACTURES): Vit D, 25-Hydroxy: 49

## 2023-03-26 LAB — BASIC METABOLIC PANEL
BUN: 23 — AB (ref 4–21)
CO2: 29 — AB (ref 13–22)
Chloride: 99 (ref 99–108)
Creatinine: 0.9 (ref 0.6–1.3)
Potassium: 4.2 meq/L (ref 3.5–5.1)
Sodium: 136 — AB (ref 137–147)

## 2023-03-26 LAB — COMPREHENSIVE METABOLIC PANEL WITH GFR: eGFR: 88

## 2023-03-26 LAB — COMPREHENSIVE METABOLIC PANEL
Albumin: 4.1 (ref 3.5–5.0)
Calcium: 9.2 (ref 8.7–10.7)
Globulin: 2.8
eGFR: 88

## 2023-03-26 LAB — HEPATIC FUNCTION PANEL
ALT: 9 U/L — AB (ref 10–40)
AST: 16 (ref 14–40)
Alkaline Phosphatase: 97 (ref 25–125)
Bilirubin, Total: 0.6

## 2023-03-26 LAB — CBC: RBC: 4.91 (ref 3.87–5.11)

## 2023-03-26 LAB — LIPID PANEL
Cholesterol: 129 (ref 0–200)
HDL: 56 (ref 35–70)
LDL Cholesterol: 59
Triglycerides: 60 (ref 40–160)

## 2023-03-26 LAB — BASIC METABOLIC PANEL WITH GFR: Glucose: 97

## 2023-03-26 LAB — HEMOGLOBIN A1C: Hemoglobin A1C: 6.2

## 2023-03-26 LAB — PROTEIN / CREATININE RATIO, URINE: Creatinine, Urine: 0.9

## 2023-04-05 ENCOUNTER — Other Ambulatory Visit (HOSPITAL_COMMUNITY): Payer: Self-pay

## 2023-04-05 ENCOUNTER — Other Ambulatory Visit: Payer: Self-pay

## 2023-04-13 ENCOUNTER — Encounter (INDEPENDENT_AMBULATORY_CARE_PROVIDER_SITE_OTHER): Payer: Self-pay | Admitting: Adult Health

## 2023-04-13 ENCOUNTER — Ambulatory Visit (INDEPENDENT_AMBULATORY_CARE_PROVIDER_SITE_OTHER): Payer: PPO | Admitting: Adult Health

## 2023-04-13 VITALS — BP 128/76 | HR 69 | Temp 97.8°F | Ht 67.0 in | Wt 218.0 lb

## 2023-04-13 DIAGNOSIS — R7303 Prediabetes: Secondary | ICD-10-CM

## 2023-04-13 DIAGNOSIS — I5032 Chronic diastolic (congestive) heart failure: Secondary | ICD-10-CM | POA: Diagnosis not present

## 2023-04-13 DIAGNOSIS — Z6834 Body mass index (BMI) 34.0-34.9, adult: Secondary | ICD-10-CM

## 2023-04-13 DIAGNOSIS — E669 Obesity, unspecified: Secondary | ICD-10-CM

## 2023-04-13 DIAGNOSIS — I110 Hypertensive heart disease with heart failure: Secondary | ICD-10-CM | POA: Diagnosis not present

## 2023-04-13 DIAGNOSIS — I10 Essential (primary) hypertension: Secondary | ICD-10-CM

## 2023-04-13 DIAGNOSIS — E785 Hyperlipidemia, unspecified: Secondary | ICD-10-CM

## 2023-04-13 NOTE — Progress Notes (Signed)
 WEIGHT SUMMARY AND BIOMETRICS  Vitals Temp: 97.8 F (36.6 C) BP: 128/76 Pulse Rate: 69 SpO2: 98 %   Anthropometric Measurements Height: 5' 7 (1.702 m) Weight: 218 lb (98.9 kg) BMI (Calculated): 34.14 Weight at Last Visit: 220lb Weight Lost Since Last Visit: 2lb Weight Gained Since Last Visit: 0 Starting Weight: 258lb Total Weight Loss (lbs): 40 lb (18.1 kg)   Body Composition  Body Fat %: 32.7 % Fat Mass (lbs): 71.4 lbs Muscle Mass (lbs): 139.6 lbs Total Body Water (lbs): 95.6 lbs Visceral Fat Rating : 22   Other Clinical Data Fasting: no Labs: no Today's Visit #: 62 Starting Date: 04/08/20    Chief Complaint:   OBESITY Richard [name] is here to discuss his progress with his obesity treatment plan. He is on the the Category 2 Plan and states he is following his eating plan approximately 60 % of the time.  He states he is exercising Golf 4 hrs minutes 1 times per week.   Interim History:  [name] was seen by his PCP/Dr. Zachary Civatte at end of Jan 2025- labs completed.  Mr. Kaps provided the following food recall that is typical of a day: Breakfast: 2 eggs with 2 slices of 45 cal bread 1 slice of 45 cal cheese Lunch: 2 slices 45 cal bread with 4 oz meat protein- lettuce, tomato, lite mayo, mustard, 45 cal cheese Snack: None Dinner: Meat protein with vegetables  Reviewed Bioimpedance results: Muscle Mass: -0.4 lb Adipose Mass: -1.6 lbs  Subjective:   1. Hyperlipidemia, unspecified hyperlipidemia type Lipid Panel     Component Value Date/Time   CHOL 157 12/09/2020 1046   TRIG 78 12/09/2020 1046   HDL 63 12/09/2020 1046   CHOLHDL 2.8 07/26/2019 1142   LDLCALC 79 12/09/2020 1046   LABVLDL 15 12/09/2020 1046   The ASCVD Risk score (Arnett DK, et al., 2019) failed to calculate for the following reasons:   Risk score cannot be calculated because patient has a medical history suggesting prior/existing ASCVD   He is able golf for hours  without CP or significant dyspnea   aspirin  EC 81 MG tablet  chlorthalidone  (HYGROTON ) 25 MG tablet  nitroGLYCERIN  (NITROSTAT ) 0.4 MG SL tablet  carvedilol  (COREG ) 12.5 MG tablet  amLODipine  (NORVASC ) 5 MG tablet  ezetimibe  (ZETIA ) 10 MG tablet  atorvastatin  (LIPITOR) 80 MG tablet   2. Essential hypertension BP at goal He is able golf for hours without CP or significant dyspnea   He is currently on: aspirin  EC 81 MG tablet  chlorthalidone  (HYGROTON ) 25 MG tablet  nitroGLYCERIN  (NITROSTAT ) 0.4 MG SL tablet  carvedilol  (COREG ) 12.5 MG tablet  amLODipine  (NORVASC ) 5 MG tablet  ezetimibe  (ZETIA ) 10 MG tablet  atorvastatin  (LIPITOR) 80 MG tablet   3. Chronic heart failure with preserved ejection fraction (HFpEF) (HCC) 2023: Started of Farxiga  for HFpEF and improvement in symptoms.  Water weight is stable per Bioimpedance read out  4. Prediabetes Lab Results  Component Value Date   HGBA1C 6.1 (H) 09/29/2022   HGBA1C 6.0 (H) 12/09/2020   HGBA1C 6.3 (H) 07/24/2020    He is on daily Farxiga  10mg  from Cards  Assessment/Plan:   1. Hyperlipidemia, unspecified hyperlipidemia type (Primary) Limit saturated fat  Continue to choose leaner meat options Obtain labs from recent PCP visit  2. Essential hypertension Limit Na+ in diet Continue current antihypertensive threapy Obtain labs from recent PCP visit  3. Chronic heart failure with preserved ejection fraction (HFpEF) (HCC) Continue with weight loss  efforts Obtain labs from recent PCP visit  4. Prediabetes Continue with weight loss efforts Obtain labs from recent PCP visit  5. Obesity, Current BMI 34.14  [name] is currently in the action stage of change. As such, his goal is to continue with weight loss efforts. He has agreed to the Category 2 Plan.   Exercise goals: Older adults should follow the adult guidelines. When older adults cannot meet the adult guidelines, they should be as physically active as their abilities  and conditions will allow.  Older adults should do exercises that maintain or improve balance if they are at risk of falling.  Older adults should determine their level of effort for physical activity relative to their level of fitness.  Older adults with chronic conditions should understand whether and how their conditions affect their ability to do regular physical activity safely.  Behavioral modification strategies: increasing lean protein intake, decreasing simple carbohydrates, increasing vegetables, increasing water intake, meal planning and cooking strategies, keeping healthy foods in the home, ways to avoid boredom eating, and planning for success.  Mounir has agreed to follow-up with our clinic in 4 weeks. He was informed of the importance of frequent follow-up visits to maximize his success with intensive lifestyle modifications for his multiple health conditions.   Objective:   Blood pressure 128/76, pulse 69, temperature 97.8 F (36.6 C), height 5' 7 (1.702 m), weight 218 lb (98.9 kg), SpO2 98%. Body mass index is 34.14 kg/m.  General: Cooperative, alert, well developed, in no acute distress. HEENT: Conjunctivae and lids unremarkable. Cardiovascular: Regular rhythm.  Lungs: Normal work of breathing. Neurologic: No focal deficits.   Lab Results  Component Value Date   CREATININE 0.91 09/29/2022   BUN 18 09/29/2022   NA 135 09/29/2022   K 4.7 09/29/2022   CL 98 09/29/2022   CO2 21 09/29/2022   Lab Results  Component Value Date   ALT 13 09/29/2022   AST 20 09/29/2022   ALKPHOS 105 09/29/2022   BILITOT 0.4 09/29/2022   Lab Results  Component Value Date   HGBA1C 6.1 (H) 09/29/2022   HGBA1C 6.0 (H) 12/09/2020   HGBA1C 6.3 (H) 07/24/2020   HGBA1C 6.2 (H) 04/08/2020   HGBA1C 6.1 (H) 04/30/2013   Lab Results  Component Value Date   INSULIN  1.1 (L) 09/29/2022   INSULIN  12.3 07/24/2020   INSULIN  5.3 04/08/2020   Lab Results  Component Value Date   TSH 1.28  02/17/2022   Lab Results  Component Value Date   CHOL 157 12/09/2020   HDL 63 12/09/2020   LDLCALC 79 12/09/2020   TRIG 78 12/09/2020   CHOLHDL 2.8 07/26/2019   Lab Results  Component Value Date   VD25OH 78.7 09/29/2022   VD25OH 72.2 12/09/2020   VD25OH 53.6 07/24/2020   Lab Results  Component Value Date   WBC 9.2 03/13/2022   HGB 13.9 03/13/2022   HCT 40.4 03/13/2022   MCV 85.4 03/13/2022   PLT 230 03/13/2022   No results found for: IRON, TIBC, FERRITIN  Attestation Statements:   Reviewed by clinician on day of visit: allergies, medications, problem list, medical history, surgical history, family history, social history, and previous encounter notes.  Time spent on visit including pre-visit chart review and post-visit care and charting was 28 minutes.   I have reviewed the above documentation for accuracy and completeness, and I agree with the above. -  Denita Lun d. Allyson Tineo, NP-C

## 2023-04-14 ENCOUNTER — Encounter: Payer: Self-pay | Admitting: Physician Assistant

## 2023-04-14 ENCOUNTER — Other Ambulatory Visit: Payer: Self-pay

## 2023-04-14 ENCOUNTER — Other Ambulatory Visit (HOSPITAL_COMMUNITY): Payer: Self-pay

## 2023-04-14 ENCOUNTER — Ambulatory Visit: Payer: PPO | Admitting: Physician Assistant

## 2023-04-14 VITALS — BP 122/60 | HR 79 | Resp 20 | Ht 66.0 in | Wt 223.0 lb

## 2023-04-14 DIAGNOSIS — R413 Other amnesia: Secondary | ICD-10-CM

## 2023-04-14 MED ORDER — DONEPEZIL HCL 5 MG PO TABS
5.0000 mg | ORAL_TABLET | Freq: Every day | ORAL | 11 refills | Status: DC
  Filled 2023-04-14 (×2): qty 30, 30d supply, fill #0
  Filled 2023-07-09: qty 30, 30d supply, fill #1
  Filled 2023-08-06: qty 30, 30d supply, fill #2

## 2023-04-14 NOTE — Patient Instructions (Signed)
 It was a pleasure to see you today at our office.   Recommendations:  Follow up in  6 months Repeat neuropsychological evaluation  Continue to use CPAP We will start donepezil   5 mg daily.      Whom to call:  Memory  decline, memory medications: Call our office [phone]   For psychiatric meds, mood meds: Please have your primary care physician manage these medications.       For assessment of decision of mental capacity and competency:  Call Dr. Rosaline Nine, geriatric psychiatrist at [phone]  For guidance in geriatric dementia issues please call [facility] [phone]       RECOMMENDATIONS FOR ALL PATIENTS WITH MEMORY PROBLEMS: 1. Continue to exercise (Recommend 30 minutes of walking everyday, or 3 hours every week) 2. Increase social interactions - continue going to [place] and enjoy social gatherings with friends and family 3. Eat healthy, avoid fried foods and eat more fruits and vegetables 4. Maintain adequate blood pressure, blood sugar, and blood cholesterol level. Reducing the risk of stroke and cardiovascular disease also helps promoting better memory. 5. Avoid stressful situations. Live a simple life and avoid aggravations. Organize your time and prepare for the next day in anticipation. 6. Sleep well, avoid any interruptions of sleep and avoid any distractions in the bedroom that may interfere with adequate sleep quality 7. Avoid sugar, avoid sweets as there is a strong link between excessive sugar intake, diabetes, and cognitive impairment We discussed the Mediterranean diet, which has been shown to help patients reduce the risk of progressive memory disorders and reduces cardiovascular risk. This includes eating fish, eat fruits and green leafy vegetables, nuts like almonds and hazelnuts, walnuts, and also use olive oil. Avoid fast foods and fried foods as much as possible. Avoid sweets and sugar as sugar use has been linked to worsening of memory  function.  There is always a concern of gradual progression of memory problems. If this is the case, then we may need to adjust level of care according to patient needs. Support, both to the patient and caregiver, should then be put into place.    FALL PRECAUTIONS: Be cautious when walking. Scan the area for obstacles that may increase the risk of trips and falls. When getting up in the mornings, sit up at the edge of the bed for a few minutes before getting out of bed. Consider elevating the bed at the head end to avoid drop of blood pressure when getting up. Walk always in a well-lit room (use night lights in the walls). Avoid area rugs or power cords from appliances in the middle of the walkways. Use a walker or a cane if necessary and consider physical therapy for balance exercise. Get your eyesight checked regularly.  FINANCIAL OVERSIGHT: Supervision, especially oversight when making financial decisions or transactions is also recommended.  HOME SAFETY: Consider the safety of the kitchen when operating appliances like stoves, microwave oven, and blender. Consider having supervision and share cooking responsibilities until no longer able to participate in those. Accidents with firearms and other hazards in the house should be identified and addressed as well.   ABILITY TO BE LEFT ALONE: If patient is unable to contact 911 operator, consider using LifeLine, or when the need is there, arrange for someone to stay with patients. Smoking is a fire hazard, consider supervision or cessation. Risk of wandering should be assessed by caregiver and if detected at any point, supervision and safe proof recommendations should be  instituted.  MEDICATION SUPERVISION: Inability to self-administer medication needs to be constantly addressed. Implement a mechanism to ensure safe administration of the medications.   DRIVING: Regarding driving, in patients with progressive memory problems, driving will be impaired.  We advise to have someone else do the driving if trouble finding directions or if minor accidents are reported. Independent driving assessment is available to determine safety of driving.   If you are interested in the driving assessment, you can contact the following:  [employer] in [place] [phone]  [facility] [phone]  [facility] [phone]  [facility] [phone] or [phone]

## 2023-04-14 NOTE — Progress Notes (Signed)
 Assessment/Plan:   Memory Impairment   Richard [name] is a very pleasant 78 y.o. RH male with a history of COPD, prediabetes, OSA on CPAP, CHF, HOH, B12 deficiency, extensive cardiac history, and strong family history of Alzheimer's disease  presenting today in follow-up for evaluation of memory loss. Patient is not on antidementia medication at this time. MMSE today is 28/30, slight decline from prior. Discussed initiating donepezil , he agrees to proceed. Patient is able to participate on ADLs and to drive without difficulties       Recommendations:   Follow up in  6 months. Patient is scheduled for neuropsych evaluation June 2025 for diagnostic clarity  Start donepezil  5 mg daily, side effects discussed  Continue CPAP for OSA Replenish B12 Recommend hearing check to improve comprehension Recommend good control of cardiovascular risk factors Continue to control mood as per PCP    Subjective:   This patient is accompanied in the office by his wife  who supplements the history. Previous records as well as any outside records available were reviewed prior to todays visit.  Patient was last seen on 10/12/2022 with MMSE 30/30    Any changes in memory since last visit?  Maybe a little worse.  He may have more difficulty remembering recent conversations and names, new information, enjoys crossword puzzles and reading novels.  He needs to write a list if he goes shopping. He also enjoys playing golf.  He is not very social wife  says. repeats oneself?  Endorsed by his wife, but not frequently  Disoriented when walking into a room?  Patient denies    Misplacing objects?  Patient denies   Wandering behavior?   Denies.   Any personality changes since last visit?  As before, his wife reports that he is quieter  Any worsening depression?: denies.   Hallucinations or paranoia?  Denies.   Seizures?   Denies.    Any sleep changes? Sleeps well. Denies vivid dreams, he may move during  his sleep but not frank REM behavior.  Denies sleepwalking   Sleep apnea?  Endorsed, uses BiPAP    Any hygiene concerns?   denies   Independent of bathing and dressing?  Endorsed  Does the patient needs help with medications? Patient is in charge  Who is in charge of the finances?  Wife is in charge    Any changes in appetite?  He had 10 wellness program trying to lose weight by eating healthy . Patient have trouble swallowing?  denies   Does the patient cook?  Yes.  Any kitchen accidents such as leaving the stove on? He has left the oven one time, not all the time.  Any headaches?    Denies.   Vision changes? Denies. He is to have cataracts soon  Chronic pain?  denies   Ambulates with difficulty? He likes to play golf.  Recent falls or head injuries?    denies      Unilateral weakness, numbness or tingling?   denies   Any tremors?  denies   Any anosmia?  He has reduced sense of smell after sinus procedure Any incontinence of urine?  He has nocturia due to BPH, takes Flomax  Any bowel dysfunction?  denies      Patient lives with his wife Does the patient drive?  Yes, not frequently, only short distances      Initial Visit 03/27/21 The patient is seen in neurologic consultation at the request of Richard Bare, MD for the evaluation of  memory.  The patient is accompanied by his wife who supplements the history. This is a 78 y.o. year old RH  male who has had memory issues for about a while during the visit to their daughter in [place] , she noticed that there was a mild decline in his father's cognition.  He was asking the same questions within minutes, and if confronted about it, he would become defensive.  He is less detail oriented than before.  He denies repeating the same stories.  Sometimes, he cannot find what he is looking for when he gets to a room.  His mood may be changing, every day he does his wife has not been a good day, has not started well but when asked to elaborate,  he snaps he continues to drive without any issues.  He enjoys doing crossword puzzles and word finding.  He sleeps well, except when he has to get up to urinate which is between 3 and 4 times a night.  His wife states that he tosses and turns and his hands move during the night, especially than in the last 8 months, but he denies having any vivid dreams.  He snores even with CPAP.  He denies any sleepwalking.  He denies any hallucinations or paranoia.  No hygiene concerns.  His medications are in a pillbox, he denies forgetting any doses.  His wife always did the finances.  His appetite is good, denies trouble swallowing.  Over the last 8 months, he has been in and controlled diet, but he discontinued all forms of sodium intake, which in turn turned to be provoking hyponatremia.  He is all or nothing-his wife says .  He cooks and denies leaving the stove on accidentally or forgetting common recipe.  He denies any head injuries, falls, seizure, or history of headaches.  He denies any vision changes, dizziness, focal numbness or tingling, unilateral weakness or tremors or anosmia.  No reported incontinence, retention, constipation or diarrhea.  He denies any alcohol, or tobacco.  Family history strong for Alzheimer's disease in mother, and her 4 sisters.  He is retired from [employer], he retired 4 years ago     Neuropsych evaluation 10/07/2021 briefly, results suggested a primary impairment across encoding (i.e., learning) aspects of verbal memory. Additional performance variability was exhibited across executive functioning, visuospatial abilities, and retrieval aspects of memory. The etiology of ongoing dysfunction is unclear at the present time. Specific to his concerns surrounding Alzheimer's disease, current testing does not yield a pattern consistent with typical presentations of this illness. Delayed retrieval of previously learned information was improved relative to initial learning  abilities from a normative standpoint. Performances were also appropriate across recognition trials and not suggestive of rapid forgetting or a notable storage deficit. Impairments encoding novel information could be related to hearing loss given that both he and his wife reported ongoing concerns and that he likely needs a formal hearing evaluation. Deficits surrounding executive functioning and visuospatial abilities can be seen in certain neurodegenerative illnesses early on, specifically Lewy body disease and corticobasal degeneration. However, Richard [name] does not currently display any behavioral features of these illnesses (e.g., REM sleep behaviors, fully-formed hallucinations, myoclonic jerking, alien limb experiences, gait abnormalities, rigidity/bradykinesia). While this makes these conditions less likely, continued monitoring will be important, especially if any of these symptoms emerge in the future.        MRI brain 04/26/21, personally reviewed no acute intracranial abnormality. Mild for age signal changes.suggestive of chronic small  vessel disease. 2. 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     Past Medical History:  Diagnosis Date   Acid reflux disease    Arthritis    Asthma 04/30/2013   Atherosclerosis of coronary artery 04/30/2013   Ventricular fibrillation; circumflex stent x2 in 2004   Benign essential hypertension    CAP (community acquired pneumonia) 04/30/2013   Class 2 severe obesity with serious comorbidity and body mass index (BMI) of 39.0 to 39.9 in adult 06/04/2020   COPD, 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   Dyspnea on exertion 09/13/2017   Edema, lower extremity    Heart failure, diastolic    Hemorrhage of rectum and anus    Hyperlipidemia 06/26/2013   Hypoxia 04/30/2013   Influenza due to identified novel influenza A virus with other respiratory manifestations 05/01/2013   Internal  hemorrhoids    Mild cognitive impairment of uncertain or unknown etiology 09/30/2021   Myocardial infarction 2004   Dr. Claudene, at [facility], yearly visits   OSA treated with BiPAP 04/24/2018   Mild OSA with an AHI of 13.5/hr and O2 sats of 88%. Now on BiPAP at 13/9cm H2O.    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   Prediabetes 04/09/2020   Pure hypercholesterolemia    Right bundle branch block 06/27/2014    EKG performed 06/27/2014   Vitamin D  deficiency      Past Surgical History:  Procedure Laterality Date   CARDIAC CATHETERIZATION  2004   with 2 stents   CARDIOVASCULAR STRESS TEST  2011   COLONOSCOPY     POLYPECTOMY  02/17/2011   Procedure: POLYPECTOMY NASAL;  Surgeon: Alm LITTIE Bouche;  Location: [facility] OR;  Service: ENT;  Laterality: N/A;   SINUS ENDO W/FUSION  02/17/2011   Procedure: ENDOSCOPIC SINUS SURGERY WITH FUSION NAVIGATION;  Surgeon: Alm LITTIE Bouche;  Location: [facility] OR;  Service: ENT;  Laterality: N/A;   TONSILLECTOMY     US  ECHOCARDIOGRAPHY       PREVIOUS MEDICATIONS:   CURRENT MEDICATIONS:  Outpatient Encounter Medications as of 04/14/2023  Medication Sig   amLODipine  (NORVASC ) 5 MG tablet Take 1 tablet (5 mg total) by mouth daily.   aspirin  EC 81 MG tablet Take 1 tablet (81 mg total) by mouth daily.   atorvastatin  (LIPITOR) 80 MG tablet Take 1 tablet (80 mg total) by mouth at bedtime.   bisacodyl  5 MG EC tablet use as directed   carvedilol  (COREG ) 12.5 MG tablet Take 1 tablet (12.5 mg total) by mouth 2 (two) times daily.   chlorthalidone  (HYGROTON ) 25 MG tablet Take 1/2 tablet (12.5 mg total) by mouth daily.   COVID-19 mRNA Vac-TriS, Pfizer, (PFIZER-BIONT COVID-19 VAC-TRIS) SUSP injection Inject into the muscle.   Cyanocobalamin  (VITAMIN B 12 PO) Take by mouth.   dapagliflozin  propanediol (FARXIGA ) 10 MG TABS tablet Take 1 tablet (10 mg total) by mouth daily before breakfast.   donepezil  (ARICEPT ) 5 MG tablet Take 1 tablet (5 mg  total) by mouth daily.   ezetimibe  (ZETIA ) 10 MG tablet Take 1 tablet (10 mg total) by mouth daily.   fluticasone  furoate-vilanterol (BREO ELLIPTA ) 200-25 MCG/ACT AEPB Inhale 1 puff into the lungs daily.   guaiFENesin  (MUCINEX ) 600 MG 12 hr tablet Take by mouth 2 (two) times daily.   ipratropium-albuterol  (DUONEB) 0.5-2.5 (3) MG/3ML SOLN Inhale 3 mLs by nebulization 2 (two) times daily and once at bedtime as needed.   montelukast  (SINGULAIR ) 10  MG tablet Take 1 tablet by mouth daily   nitroGLYCERIN  (NITROSTAT ) 0.4 MG SL tablet Place 1 tablet (0.4 mg total) under the tongue every 5 (five) minutes as needed. For chest pain   omeprazole  (PRILOSEC) 40 MG capsule Take 1 capsule (40 mg total) by mouth 2 (two) times daily.   omeprazole  (PRILOSEC) 40 MG capsule Take 1 capsule (40 mg total) by mouth 2 (two) times daily.   polyethylene glycol powder (GLYCOLAX/MIRALAX) 17 GM/SCOOP powder See admin instructions.   polyethylene glycol-electrolytes (NULYTELY) 420 g solution Use as directed   polyethylene glycol-electrolytes (NULYTELY) 420 g solution Use as directed   potassium chloride  (MICRO-K ) 10 MEQ CR capsule Take 1 capsule (10 mEq total) by mouth daily.   tamsulosin  (FLOMAX ) 0.4 MG CAPS capsule Take 1 capsule (0.4 mg total) by mouth daily.   Vitamin D , Ergocalciferol , 50000 units CAPS Take 1 capsule by mouth weekly   No facility-administered encounter medications on file as of 04/14/2023.     Objective:     PHYSICAL EXAMINATION:    VITALS:   Vitals:   04/14/23 1241  BP: 122/60  Pulse: 79  Resp: 20  SpO2: 98%  Weight: 223 lb (101.2 kg)  Height: 5' 6 (1.676 m)    GEN:  The patient appears stated age and is in NAD. HEENT:  Normocephalic, atraumatic.   Neurological examination:  General: NAD, well-groomed, appears stated age. Orientation: The patient is alert. Oriented to person, place and date Cranial nerves: There is good facial symmetry.The speech is fluent and clear. No aphasia or  dysarthria. Fund of knowledge is appropriate. Recent memory impaired and remote memory is normal.  Attention and concentration are normal.  Able to name objects and repeat phrases.  Hearing is intact to conversational tone.   Delayed recall 2/3 Sensation: Sensation is intact to light touch throughout Motor: Strength is at least antigravity x4. DTR's 2/4 in UE/LE      03/27/2021   10:00 AM  Montreal Cognitive Assessment   Visuospatial/ Executive (0/5) 2  Naming (0/3) 3  Attention: Read list of digits (0/2) 2  Attention: Read list of letters (0/1) 1  Attention: Serial 7 subtraction starting at 100 (0/3) 3  Language: Repeat phrase (0/2) 2  Language : Fluency (0/1) 1  Abstraction (0/2) 2  Delayed Recall (0/5) 4  Orientation (0/6) 6  Total 26       04/14/2023    2:00 PM 10/21/2022    8:00 AM 10/12/2022    5:00 PM  MMSE - Mini Mental State Exam  Orientation to time 4 5 5   Orientation to Place 5 5 5   Registration 3 3 3   Attention/ Calculation 5 5 5   Recall 2 3 3   Language- name 2 objects 2 2 2   Language- repeat 1 1 1   Language- follow 3 step command 3 3 3   Language- read & follow direction 1 1 1   Write a sentence 1 1 1   Copy design 1 0 1  Total score 28 29 30        Movement examination: Tone: There is normal tone in the UE/LE Abnormal movements:  no tremor.  No myoclonus.  No asterixis.   Coordination:  There is no decremation with RAM's. Normal finger to nose  Gait and Station: The patient has no difficulty arising out of a deep-seated chair without the use of the hands. The patient's stride length is good.  Gait is cautious and narrow. Less arm swing than prior visit.  Thank you  for allowing us  the opportunity to participate in the care of this nice patient. Please do not hesitate to contact us  for any questions or concerns.   Total time spent on today's visit was 30 minutes dedicated to this patient today, preparing to see patient, examining the patient, ordering tests and/or  medications and counseling the patient, documenting clinical information in the EHR or other health record, independently interpreting results and communicating results to the patient/family, discussing treatment and goals, answering patient's questions and coordinating care.  Cc:  Richard Bare, MD  Camie Sevin 04/14/2023 2:25 PM

## 2023-04-30 DIAGNOSIS — H524 Presbyopia: Secondary | ICD-10-CM | POA: Diagnosis not present

## 2023-04-30 DIAGNOSIS — H2513 Age-related nuclear cataract, bilateral: Secondary | ICD-10-CM | POA: Diagnosis not present

## 2023-05-06 ENCOUNTER — Other Ambulatory Visit (HOSPITAL_COMMUNITY): Payer: Self-pay

## 2023-05-10 ENCOUNTER — Other Ambulatory Visit (HOSPITAL_COMMUNITY): Payer: Self-pay

## 2023-05-10 MED ORDER — FLUTICASONE FUROATE-VILANTEROL 200-25 MCG/ACT IN AEPB
1.0000 | INHALATION_SPRAY | Freq: Every day | RESPIRATORY_TRACT | 3 refills | Status: DC
  Filled 2023-05-10: qty 180, 90d supply, fill #0

## 2023-05-18 ENCOUNTER — Ambulatory Visit (INDEPENDENT_AMBULATORY_CARE_PROVIDER_SITE_OTHER): Payer: PPO | Admitting: Adult Health

## 2023-05-18 ENCOUNTER — Encounter (INDEPENDENT_AMBULATORY_CARE_PROVIDER_SITE_OTHER): Payer: Self-pay | Admitting: Adult Health

## 2023-05-18 VITALS — BP 119/69 | HR 68 | Temp 97.8°F | Ht 67.0 in | Wt 217.0 lb

## 2023-05-18 DIAGNOSIS — I10 Essential (primary) hypertension: Secondary | ICD-10-CM

## 2023-05-18 DIAGNOSIS — I5032 Chronic diastolic (congestive) heart failure: Secondary | ICD-10-CM

## 2023-05-18 DIAGNOSIS — R7303 Prediabetes: Secondary | ICD-10-CM

## 2023-05-18 DIAGNOSIS — E669 Obesity, unspecified: Secondary | ICD-10-CM | POA: Diagnosis not present

## 2023-05-18 DIAGNOSIS — Z6833 Body mass index (BMI) 33.0-33.9, adult: Secondary | ICD-10-CM | POA: Diagnosis not present

## 2023-05-18 DIAGNOSIS — R413 Other amnesia: Secondary | ICD-10-CM

## 2023-05-18 DIAGNOSIS — I110 Hypertensive heart disease with heart failure: Secondary | ICD-10-CM | POA: Diagnosis not present

## 2023-05-18 NOTE — Progress Notes (Signed)
 WEIGHT SUMMARY AND BIOMETRICS  Vitals Temp: 97.8 F (36.6 C) BP: 119/69 Pulse Rate: 68 SpO2: 97 %   Anthropometric Measurements Height: 5\' 7"  (1.702 m) Weight: 217 lb (98.4 kg) BMI (Calculated): 33.98 Weight at Last Visit: 218lb Weight Lost Since Last Visit: 1lb Weight Gained Since Last Visit: 0 Starting Weight: 258lb Total Weight Loss (lbs): 41 lb (18.6 kg)   Body Composition  Body Fat %: 32.7 % Fat Mass (lbs): 71 lbs Muscle Mass (lbs): 138.8 lbs Total Body Water (lbs): 93.4 lbs Visceral Fat Rating : 22   Other Clinical Data Fasting: no Labs: no Today's Visit #: 49 Starting Date: 04/08/20    Chief Complaint:   OBESITY Richard [name] is here to discuss his progress with his obesity treatment plan.  He is on the the Category 2 Plan and states he is following his eating plan approximately 65-70 % of the time.  He states he is exercising: [employer] GOLF as weather improves!   Interim History:  He was recently seen by Neurology and started on daily Donepezil 5mg  Per pt- his mother was dx'd with Alzhimer's in her mid 71s She passed away between ages 31-88 Richard [name] states "my mind isn't working as it used to". He is active and works on a puzzle daily.  He wil travel to [facility] next week with his [employer]. 18 players over 4 days (Wed-Sat) They will have breakfast at resort buffet, lunch is often light sandwich while on the links, the evening is seafood out at [employer]. This is his 5th year attending this annual event!  Sleep- he reports 6-7 hrs sleep each night, he reports infrequent daytime napping  Hydration-he estimates to drink 44-48 oz water/day  Subjective:   1. Memory impairment 04/14/2023 Neuro OV Notes Assessment/Plan:    Memory Impairment    Richard [name] is a very pleasant 78 y.o. RH male with a history of COPD, prediabetes, OSA on CPAP, CHF, HOH, B12 deficiency, extensive cardiac history, and strong  family history of Alzheimer's disease  presenting today in follow-up for evaluation of memory loss. Patient is not on antidementia medication at this time. MMSE today is 28/30, slight decline from prior. Discussed initiating donepezil, he agrees to proceed. Patient is able to participate on ADLs and to drive without difficulties  Recommendations:    Follow up in  6 months. Patient is scheduled for neuropsych evaluation June 2025 for diagnostic clarity  Start donepezil 5 mg daily, side effects discussed  Continue CPAP for OSA Replenish B12 Recommend hearing check to improve comprehension Recommend good control of cardiovascular risk factors Continue to control mood as per PCP  2. Chronic heart failure with preserved ejection fraction (HFpEF) (HCC) BP excellent and at goal at OV 2023: Started of Farxiga for HFpEF and improvement in symptoms.  He is currently on daily Farxiga 10mg  each morning Water weight decreased per Bioimpedance read out He denies dyspnea  3. Essential hypertension BP excellent and at goal at OV He is looking forward to beginning the Spring-Summer 2025 Golf Season  4. Prediabetes 2023: Started of Farxiga for HFpEF and improvement in symptoms.  Water weight decreased per Bioimpedance read out  Assessment/Plan:   1. Memory impairment Continue Donepezil 5mg  per Neuro Continue healthy eating and daily activity/exercise  2. Chronic heart failure with preserved ejection fraction (HFpEF) (HCC) (Primary) Continue healthy eating and daily activity/exercise Continue aspirin EC 81 MG tablet  chlorthalidone (HYGROTON) 25 MG tablet  nitroGLYCERIN (NITROSTAT)  0.4 MG SL tablet  carvedilol (COREG) 12.5 MG tablet  amLODipine (NORVASC) 5 MG tablet  ezetimibe (ZETIA) 10 MG tablet  atorvastatin (LIPITOR) 80 MG tablet   Monitor for HF sx's closely  3. Essential hypertension Continue aspirin EC 81 MG tablet  chlorthalidone (HYGROTON) 25 MG tablet  nitroGLYCERIN  (NITROSTAT) 0.4 MG SL tablet  carvedilol (COREG) 12.5 MG tablet  amLODipine (NORVASC) 5 MG tablet  ezetimibe (ZETIA) 10 MG tablet  atorvastatin (LIPITOR) 80 MG tablet   4. Prediabetes Continue Cat 2 Meal Plan and regular activity Continue daily Farxiga 10 mg per Cards  5. Obesity, Current BMI 33.98  Richard [name] is currently in the action stage of change. As such, his goal is to continue with weight loss efforts. He has agreed to the Category 2 Plan.   Exercise goals: Older adults should follow the adult guidelines. When older adults cannot meet the adult guidelines, they should be as physically active as their abilities and conditions will allow.  Older adults should do exercises that maintain or improve balance if they are at risk of falling.  Older adults should determine their level of effort for physical activity relative to their level of fitness.  Older adults with chronic conditions should understand whether and how their conditions affect their ability to do regular physical activity safely.  Behavioral modification strategies: increasing lean protein intake, decreasing simple carbohydrates, increasing vegetables, increasing water intake, no skipping meals, meal planning and cooking strategies, keeping healthy foods in the home, ways to avoid boredom eating, travel eating strategies, and planning for success.  Richard [name] has agreed to follow-up with our clinic in 4 weeks. He was informed of the importance of frequent follow-up visits to maximize his success with intensive lifestyle modifications for his multiple health conditions.   Objective:   Blood pressure 119/69, pulse 68, temperature 97.8 F (36.6 C), height 5\' 7"  (1.702 m), weight 217 lb (98.4 kg), SpO2 97%. Body mass index is 33.99 kg/m.  General: Cooperative, alert, well developed, in no acute distress. HEENT: Conjunctivae and lids unremarkable. Cardiovascular: Regular rhythm.  Lungs: Normal work of breathing. Neurologic: No  focal deficits.   Lab Results  Component Value Date   CREATININE 0.9 03/26/2023   BUN 23 (A) 03/26/2023   NA 136 (A) 03/26/2023   K 4.2 03/26/2023   CL 99 03/26/2023   CO2 29 (A) 03/26/2023   Lab Results  Component Value Date   ALT 9 (A) 03/26/2023   AST 16 03/26/2023   ALKPHOS 97 03/26/2023   BILITOT 0.4 09/29/2022   Lab Results  Component Value Date   HGBA1C 6.2 03/26/2023   HGBA1C 6.1 (H) 09/29/2022   HGBA1C 6.0 (H) 12/09/2020   HGBA1C 6.3 (H) 07/24/2020   HGBA1C 6.2 (H) 04/08/2020   Lab Results  Component Value Date   INSULIN 1.1 (L) 09/29/2022   INSULIN 12.3 07/24/2020   INSULIN 5.3 04/08/2020   Lab Results  Component Value Date   TSH 1.28 02/17/2022   Lab Results  Component Value Date   CHOL 157 12/09/2020   HDL 63 12/09/2020   LDLCALC 79 12/09/2020   TRIG 78 12/09/2020   CHOLHDL 2.8 07/26/2019   Lab Results  Component Value Date   VD25OH 49 03/26/2023   VD25OH 78.7 09/29/2022   VD25OH 72.2 12/09/2020   Lab Results  Component Value Date   WBC 4.5 03/26/2023   HGB 14.7 03/26/2023   HCT 45 03/26/2023   MCV 85.4 03/13/2022   PLT 230 03/13/2022  No results found for: "IRON", "TIBC", "FERRITIN"  Attestation Statements:   Reviewed by clinician on day of visit: allergies, medications, problem list, medical history, surgical history, family history, social history, and previous encounter notes.  Time spent on visit including pre-visit chart review and post-visit care and charting was 27 minutes.   I have reviewed the above documentation for accuracy and completeness, and I agree with the above. -  Mattalynn Crandle d. Damarian Priola, NP-C

## 2023-06-01 DIAGNOSIS — G4733 Obstructive sleep apnea (adult) (pediatric): Secondary | ICD-10-CM | POA: Diagnosis not present

## 2023-06-01 NOTE — Progress Notes (Unsigned)
 Date:  06/02/2023   ID:  Richard [name], DOB 05/01/1945, MRN [id number]  PCP:  Corine Shelter, MD  Cardiologist:  Verdis Prime, MD Sleep Medicine:  Armanda Magic, MD Electrophysiologist:  None   Chief Complaint:  OSA  History of Present Illness:    Richard [name] is a 78 y.o. male with a hx of Obesity, snoring and witnessed Apnea.  He was referred for sleep study which showed mild OSA with an AHI of 13.5/hr and O2 sats of 88% with events.  He underwent BiPAP titration to 13/9cm H2O.    He is doing well with his PAP device and thinks that he has gotten used to it.  He tolerates the full face mask and feels the pressure is adequate.  Since going on PAP he feels rested in the am and has no significant daytime sleepiness.  He denies any significant mouth or nasal dryness or nasal congestion.  He does not think that he snores.     Prior CV studies:   The following studies were reviewed today:  PAP compliance download  Past Medical History:  Diagnosis Date   Acid reflux disease    Arthritis    Asthma 04/30/2013   Atherosclerosis of coronary artery 04/30/2013   Ventricular fibrillation; circumflex stent x2 in 2004   Benign essential hypertension    CAP (community acquired pneumonia) 04/30/2013   Class 2 severe obesity with serious comorbidity and body mass index (BMI) of 39.0 to 39.9 in adult 06/04/2020   COPD, 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   Dyspnea on exertion 09/13/2017   Edema, lower extremity    Heart failure, diastolic    Hemorrhage of rectum and anus    Hyperlipidemia 06/26/2013   Hypoxia 04/30/2013   Influenza due to identified novel influenza A virus with other respiratory manifestations 05/01/2013   Internal hemorrhoids    Mild cognitive impairment of uncertain or unknown etiology 09/30/2021   Myocardial infarction 2004   Dr. Katrinka Blazing, at [place], yearly visits   OSA treated with BiPAP 04/24/2018   Mild OSA with an AHI of 13.5/hr and O2 sats of 88%. Now on BiPAP at 13/9cm  H2O.    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   Prediabetes 04/09/2020   Pure hypercholesterolemia    Right bundle branch block 06/27/2014    EKG performed 06/27/2014   Vitamin D deficiency    Past Surgical History:  Procedure Laterality Date   CARDIAC CATHETERIZATION  2004   with 2 stents   CARDIOVASCULAR STRESS TEST  2011   COLONOSCOPY     POLYPECTOMY  02/17/2011   Procedure: POLYPECTOMY NASAL;  Surgeon: Barbee Cough;  Location: [facility];  Service: ENT;  Laterality: N/A;   SINUS ENDO W/FUSION  02/17/2011   Procedure: ENDOSCOPIC SINUS SURGERY WITH FUSION NAVIGATION;  Surgeon: Barbee Cough;  Location: [facility];  Service: ENT;  Laterality: N/A;   TONSILLECTOMY     US ECHOCARDIOGRAPHY       Current Meds  Medication Sig   amLODipine (NORVASC) 5 MG tablet Take 1 tablet (5 mg total) by mouth daily.   aspirin EC 81 MG tablet Take 1 tablet (81 mg total) by mouth daily.   atorvastatin (LIPITOR) 80 MG tablet Take 1 tablet (80 mg total) by mouth at bedtime.   bisacodyl 5 MG EC tablet use as directed   carvedilol (COREG) 12.5 MG tablet Take 1 tablet (12.5 mg total) by mouth 2 (two) times daily.   chlorthalidone (  HYGROTON) 25 MG tablet Take 1/2 tablet (12.5 mg total) by mouth daily.   COVID-19 mRNA Vac-TriS, Pfizer, (PFIZER-BIONT COVID-19 VAC-TRIS) SUSP injection Inject into the muscle.   Cyanocobalamin (VITAMIN B 12 PO) Take by mouth.   dapagliflozin propanediol (FARXIGA) 10 MG TABS tablet Take 1 tablet (10 mg total) by mouth daily before breakfast.   donepezil (ARICEPT) 5 MG tablet Take 1 tablet (5 mg total) by mouth daily.   ezetimibe (ZETIA) 10 MG tablet Take 1 tablet (10 mg total) by mouth daily.   fluticasone furoate-vilanterol (BREO ELLIPTA) 200-25 MCG/ACT AEPB Inhale 1 puff into the lungs daily.   guaiFENesin (MUCINEX) 600 MG 12 hr tablet Take by mouth 2 (two) times daily.   ipratropium-albuterol (DUONEB) 0.5-2.5 (3) MG/3ML SOLN Inhale 3 mLs  by nebulization 2 (two) times daily and once at bedtime as needed.   montelukast (SINGULAIR) 10 MG tablet Take 1 tablet by mouth daily   nitroGLYCERIN (NITROSTAT) 0.4 MG SL tablet Place 1 tablet (0.4 mg total) under the tongue every 5 (five) minutes as needed. For chest pain   omeprazole (PRILOSEC) 40 MG capsule Take 1 capsule (40 mg total) by mouth 2 (two) times daily.   polyethylene glycol powder (GLYCOLAX/MIRALAX) 17 GM/SCOOP powder See admin instructions.   polyethylene glycol-electrolytes (NULYTELY) 420 g solution Use as directed   potassium chloride (MICRO-K) 10 MEQ CR capsule Take 1 capsule (10 mEq total) by mouth daily.   tamsulosin (FLOMAX) 0.4 MG CAPS capsule Take 1 capsule (0.4 mg total) by mouth daily.   Vitamin D, Ergocalciferol, 50000 units CAPS Take 1 capsule by mouth weekly   [DISCONTINUED] fluticasone furoate-vilanterol (BREO ELLIPTA) 200-25 MCG/ACT AEPB Inhale 1 puff into the lungs daily.   [DISCONTINUED] omeprazole (PRILOSEC) 40 MG capsule Take 1 capsule (40 mg total) by mouth 2 (two) times daily.   [DISCONTINUED] polyethylene glycol-electrolytes (NULYTELY) 420 g solution Use as directed     Allergies:   Beef-derived drug products, Chicken protein, Lisinopril, and Peanut-containing drug products   Social History   Tobacco Use   Smoking status: Former   Smokeless tobacco: Never  Vaping Use   Vaping status: Never Used  Substance Use Topics   Alcohol use: Not Currently    Comment: infrequent   Drug use: No     Family Hx: The patient's family history includes Alzheimer's disease in his cousin, maternal aunt, maternal uncle, and mother; Cancer in his mother; Diabetes in his father and sister; Heart attack in his father; Hypertension in his father and sister; Stroke in his mother.  ROS:   Please see the history of present illness.     All other systems reviewed and are negative.   Labs/Other Tests and Data Reviewed:    Recent Labs: 03/26/2023: ALT 9; BUN 23;  Creatinine 0.9; Hemoglobin 14.7; Potassium 4.2; Sodium 136   Recent Lipid Panel Lab Results  Component Value Date/Time   CHOL 157 12/09/2020 10:46 AM   TRIG 78 12/09/2020 10:46 AM   HDL 63 12/09/2020 10:46 AM   CHOLHDL 2.8 07/26/2019 11:42 AM   LDLCALC 79 12/09/2020 10:46 AM    Wt Readings from Last 3 Encounters:  06/02/23 224 lb (101.6 kg)  05/18/23 217 lb (98.4 kg)  04/14/23 223 lb (101.2 kg)     Objective:    Vital Signs:  BP 126/68   Pulse 69   Ht 5\' 7"  (1.702 m)   Wt 224 lb (101.6 kg)   SpO2 96%   BMI 35.08 kg/m  GEN: Well nourished, well developed in no acute distress HEENT: Normal NECK: No JVD; No carotid bruits LYMPHATICS: No lymphadenopathy CARDIAC:RRR, no murmurs, rubs, gallops RESPIRATORY:  Clear to auscultation without rales, wheezing or rhonchi  ABDOMEN: Soft, non-tender, non-distended MUSCULOSKELETAL:  No edema; No deformity  SKIN: Warm and dry NEUROLOGIC:  Alert and oriented x 3 PSYCHIATRIC:  Normal affect  ASSESSMENT & PLAN:    1.  OSA - The patient is tolerating PAP therapy well without any problems. The PAP download performed by his DME was personally reviewed and interpreted by me today and showed an AHI of 4.5/hr on 13/9 cm H2O with 90% compliance in using more than 4 hours nightly.  The patient has been using and benefiting from PAP use and will continue to benefit from therapy.    2.  HTN -BP controlled today -continue prescription drug management with Amlodipine 5mg  daily, Carvedilol 12.5mg  BID and chlorthalidone 25mg  daily with PRN refills -I have personally reviewed and interpreted outside labs performed by patient's PCP which showed SCr 0.9 and K+ 4.2 on 03/26/2023  Medication Adjustments/Labs and Tests Ordered: Current medicines are reviewed at length with the patient today.  Concerns regarding medicines are outlined above.  Tests Ordered: No orders of the defined types were placed in this encounter.  Medication Changes: No orders of  the defined types were placed in this encounter.   Disposition:  Follow up in 1 year(s)  Signed, Armanda Magic, MD  06/02/2023 8:41 AM    [facility]

## 2023-06-01 NOTE — Progress Notes (Unsigned)
 Date:  06/02/2023   ID:  Richard [name], DOB Jul 04, 1945, MRN [id number]  PCP:  Corine Shelter, MD  Cardiologist:  Verdis Prime, MD Sleep Medicine:  Armanda Magic, MD Electrophysiologist:  None   Chief Complaint:  OSA  History of Present Illness:    Richard [name] is a 78 y.o. male with a hx of Obesity, snoring and witnessed Apnea.  He was referred for sleep study which showed mild OSA with an AHI of 13.5/hr and O2 sats of 88% with events.  He underwent BiPAP titration to 13/9cm H2O.    He is doing well with his PAP device and thinks that he has gotten used to it.  He tolerates the mask and feels the pressure is adequate. He has never really felt and difference in how rested he feels in the am after starting PAP therapy but does not have to take any naps. He committed denies any significant mouth or nasal dryness or nasal congestion.  He does not think that he snores.     Prior CV studies:   The following studies were reviewed today:  PAP compliance download  Past Medical History:  Diagnosis Date   Acid reflux disease    Arthritis    Asthma 04/30/2013   Atherosclerosis of coronary artery 04/30/2013   Ventricular fibrillation; circumflex stent x2 in 2004   Benign essential hypertension    CAP (community acquired pneumonia) 04/30/2013   Class 2 severe obesity with serious comorbidity and body mass index (BMI) of 39.0 to 39.9 in adult 06/04/2020   COPD, 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   Dyspnea on exertion 09/13/2017   Edema, lower extremity    Heart failure, diastolic    Hemorrhage of rectum and anus    Hyperlipidemia 06/26/2013   Hypoxia 04/30/2013   Influenza due to identified novel influenza A virus with other respiratory manifestations 05/01/2013   Internal hemorrhoids    Mild cognitive impairment of uncertain or unknown etiology 09/30/2021   Myocardial infarction 2004   Dr. Katrinka Blazing, at [place], yearly visits   OSA treated with BiPAP 04/24/2018   Mild OSA with an AHI of  13.5/hr and O2 sats of 88%. Now on BiPAP at 13/9cm H2O.    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   Prediabetes 04/09/2020   Pure hypercholesterolemia    Right bundle branch block 06/27/2014    EKG performed 06/27/2014   Vitamin D deficiency    Past Surgical History:  Procedure Laterality Date   CARDIAC CATHETERIZATION  2004   with 2 stents   CARDIOVASCULAR STRESS TEST  2011   COLONOSCOPY     POLYPECTOMY  02/17/2011   Procedure: POLYPECTOMY NASAL;  Surgeon: Barbee Cough;  Location: [facility];  Service: ENT;  Laterality: N/A;   SINUS ENDO W/FUSION  02/17/2011   Procedure: ENDOSCOPIC SINUS SURGERY WITH FUSION NAVIGATION;  Surgeon: Barbee Cough;  Location: [facility];  Service: ENT;  Laterality: N/A;   TONSILLECTOMY     US ECHOCARDIOGRAPHY       No outpatient medications have been marked as taking for the 06/02/23 encounter (Office Visit) with Quintella Reichert, MD.     Allergies:   Beef-derived drug products, Chicken protein, Lisinopril, and Peanut-containing drug products   Social History   Tobacco Use   Smoking status: Former   Smokeless tobacco: Never  Vaping Use   Vaping status: Never Used  Substance Use Topics   Alcohol use: Not Currently    Comment: infrequent  Drug use: No     Family Hx: The patient's family history includes Alzheimer's disease in his cousin, maternal aunt, maternal uncle, and mother; Cancer in his mother; Diabetes in his father and sister; Heart attack in his father; Hypertension in his father and sister; Stroke in his mother.  ROS:   Please see the history of present illness.     All other systems reviewed and are negative.   Labs/Other Tests and Data Reviewed:    Recent Labs: 03/26/2023: ALT 9; BUN 23; Creatinine 0.9; Hemoglobin 14.7; Potassium 4.2; Sodium 136   Recent Lipid Panel Lab Results  Component Value Date/Time   CHOL 157 12/09/2020 10:46 AM   TRIG 78 12/09/2020 10:46 AM   HDL 63 12/09/2020  10:46 AM   CHOLHDL 2.8 07/26/2019 11:42 AM   LDLCALC 79 12/09/2020 10:46 AM    Wt Readings from Last 3 Encounters:  05/18/23 217 lb (98.4 kg)  04/14/23 223 lb (101.2 kg)  04/13/23 218 lb (98.9 kg)     Objective:    Vital Signs:  There were no vitals taken for this visit.   GEN: Well nourished, well developed in no acute distress HEENT: Normal NECK: No JVD; No carotid bruits LYMPHATICS: No lymphadenopathy CARDIAC:RRR, no murmurs, rubs, gallops RESPIRATORY:  Clear to auscultation without rales, wheezing or rhonchi  ABDOMEN: Soft, non-tender, non-distended MUSCULOSKELETAL:  No edema; No deformity  SKIN: Warm and dry NEUROLOGIC:  Alert and oriented x 3 PSYCHIATRIC:  Normal affect   ASSESSMENT & PLAN:    1.    2.  HTN -BP controlled on exam today -continue prescription drug management with Amlodipine 5mg  daily, Carvedilol 12.5mg  BID and Chlorthalidone 25mg  daily with PRN refills  Medication Adjustments/Labs and Tests Ordered: Current medicines are reviewed at length with the patient today.  Concerns regarding medicines are outlined above.  Tests Ordered: No orders of the defined types were placed in this encounter.  Medication Changes: No orders of the defined types were placed in this encounter.   Disposition:  Follow up in 1 year(s)  Signed, Armanda Magic, MD  06/02/2023 8:17 AM    [facility]

## 2023-06-02 ENCOUNTER — Encounter: Payer: Self-pay | Admitting: Cardiology

## 2023-06-02 ENCOUNTER — Ambulatory Visit: Payer: PPO | Attending: Cardiology | Admitting: Cardiology

## 2023-06-02 VITALS — BP 126/68 | HR 69 | Ht 67.0 in | Wt 224.0 lb

## 2023-06-02 DIAGNOSIS — G4733 Obstructive sleep apnea (adult) (pediatric): Secondary | ICD-10-CM | POA: Diagnosis not present

## 2023-06-02 DIAGNOSIS — I10 Essential (primary) hypertension: Secondary | ICD-10-CM

## 2023-06-09 ENCOUNTER — Other Ambulatory Visit (HOSPITAL_COMMUNITY): Payer: Self-pay

## 2023-06-09 DIAGNOSIS — H25811 Combined forms of age-related cataract, right eye: Secondary | ICD-10-CM | POA: Diagnosis not present

## 2023-06-09 DIAGNOSIS — H2511 Age-related nuclear cataract, right eye: Secondary | ICD-10-CM | POA: Diagnosis not present

## 2023-06-16 ENCOUNTER — Other Ambulatory Visit: Payer: Self-pay

## 2023-06-16 ENCOUNTER — Other Ambulatory Visit (HOSPITAL_COMMUNITY): Payer: Self-pay

## 2023-07-06 ENCOUNTER — Ambulatory Visit (INDEPENDENT_AMBULATORY_CARE_PROVIDER_SITE_OTHER): Admitting: Adult Health

## 2023-07-07 ENCOUNTER — Encounter (INDEPENDENT_AMBULATORY_CARE_PROVIDER_SITE_OTHER): Payer: Self-pay | Admitting: Adult Health

## 2023-07-07 ENCOUNTER — Ambulatory Visit (INDEPENDENT_AMBULATORY_CARE_PROVIDER_SITE_OTHER): Admitting: Adult Health

## 2023-07-07 VITALS — BP 124/65 | HR 73 | Temp 98.3°F | Ht 67.0 in | Wt 220.0 lb

## 2023-07-07 DIAGNOSIS — I10 Essential (primary) hypertension: Secondary | ICD-10-CM | POA: Diagnosis not present

## 2023-07-07 DIAGNOSIS — E559 Vitamin D deficiency, unspecified: Secondary | ICD-10-CM | POA: Diagnosis not present

## 2023-07-07 DIAGNOSIS — E66812 Obesity, class 2: Secondary | ICD-10-CM

## 2023-07-07 DIAGNOSIS — Z6834 Body mass index (BMI) 34.0-34.9, adult: Secondary | ICD-10-CM

## 2023-07-07 DIAGNOSIS — E669 Obesity, unspecified: Secondary | ICD-10-CM | POA: Diagnosis not present

## 2023-07-07 DIAGNOSIS — E871 Hypo-osmolality and hyponatremia: Secondary | ICD-10-CM

## 2023-07-07 DIAGNOSIS — R7303 Prediabetes: Secondary | ICD-10-CM

## 2023-07-07 DIAGNOSIS — Z6839 Body mass index (BMI) 39.0-39.9, adult: Secondary | ICD-10-CM

## 2023-07-07 NOTE — Progress Notes (Signed)
 WEIGHT SUMMARY AND BIOMETRICS  Vitals Temp: 98.3 F (36.8 C) BP: 124/65 Pulse Rate: 73 SpO2: 98 %   Anthropometric Measurements Height: 5\' 7"  (1.702 m) Weight: 220 lb (99.8 kg) BMI (Calculated): 34.45 Weight at Last Visit: 217 lb Weight Lost Since Last Visit: 0 Weight Gained Since Last Visit: 3 lb Starting Weight: 258 lb Total Weight Loss (lbs): 38 lb (17.2 kg)   Body Composition  Body Fat %: 32.7 % Fat Mass (lbs): 72 lbs Muscle Mass (lbs): 140.6 lbs Total Body Water (lbs): 95.8 lbs Visceral Fat Rating : 22   Other Clinical Data Fasting: no Labs: no Today's Visit #: 40 Starting Date: 04/08/20    Chief Complaint:   OBESITY Richard [name] is here to discuss his progress with his obesity treatment plan.  He is on the the Category 2 Plan and states he is following his eating plan approximately 70 % of the time.  He states he is exercising Golf and [employer] 4 hrs/daily- Golf 2 times per week.  Interim History:  He enjoyed his trip to [facility]- 3rd week in March He Golfed 18 holes every day while at [employer].  He underwent successful right eye cataract lens replacement. Right eye vision prior to procedure 20/40, right eye vision 20/20  He will have left eye lens replacement in the next year.  Reviewed Bioimpedance results with pt: Muscle Mass: +1.8 lbs Adipose Mass: + 1 lb  Subjective:   1. Prediabetes Lab Results  Component Value Date   HGBA1C 6.2 03/26/2023   HGBA1C 6.1 (H) 09/29/2022   HGBA1C 6.0 (H) 12/09/2020   2023: Started of Farxiga  for HFpEF and improvement in symptoms.  2. Essential hypertension BP excellent at OV He denies CP with exertion Hx of CAD with MI in 2004, LCX PCI in 2004 with ischemia and VF.   HLD, HTN, and OSA on CPAP.   He is currently on aspirin  EC 81 MG tablet  chlorthalidone  (HYGROTON ) 25 MG tablet  nitroGLYCERIN  (NITROSTAT ) 0.4 MG SL tablet  carvedilol  (COREG ) 12.5 MG tablet  amLODipine  (NORVASC ) 5 MG  tablet  ezetimibe  (ZETIA ) 10 MG tablet  atorvastatin  (LIPITOR) 80 MG tablet   3. Hyponatremia He denies HA, dizziness, change in mentation.  He is currently Chlorthalidone  12.5mg  and Tamsulosin  0.4mg  daily   Latest Reference Range & Units 02/17/22 00:00 03/13/22 08:51 09/29/22 14:10 03/26/23 08:36  Sodium 137 - 147  134 ! (E) 128 (L) 135 136 ! (E)   4. Vitamin D  deficiency  03/26/23 08:36  Vitamin D , 25-Hydroxy 49 (E)  (E): External lab result  He is on weekly Ergocalciferol - denies N/V/Muscle Weakness  Assessment/Plan:   1. Prediabetes (Primary) Continue healthy eating and regular activity Check Labs at next OV Continue Farxiga  10mg   2. Essential hypertension Continue healthy eating and regular activity Check Labs at next OV  3. Hyponatremia Continue healthy eating and regular activity Check Labs at next OV  4. Vitamin D  deficiency Continue Ergocalciferol  per PCP Check Labs at next OV  5. Obesity, Current BMI 34.5  [name] is currently in the action stage of change. As such, his goal is to continue with weight loss efforts. He has agreed to the Category 2 Plan.   Exercise goals: Older adults should follow the adult guidelines. When older adults cannot meet the adult guidelines, they should be as physically active as their abilities and conditions will allow.  Older adults should do exercises that maintain or improve balance if they  are at risk of falling.  Older adults should determine their level of effort for physical activity relative to their level of fitness.  Older adults with chronic conditions should understand whether and how their conditions affect their ability to do regular physical activity safely.  Behavioral modification strategies: increasing lean protein intake, decreasing simple carbohydrates, increasing vegetables, increasing water intake, no skipping meals, meal planning and cooking strategies, keeping healthy foods in the home, and ways to avoid boredom  eating.  Bassil has agreed to follow-up with our clinic in 4 weeks. He was informed of the importance of frequent follow-up visits to maximize his success with intensive lifestyle modifications for his multiple health conditions.   Objective:   Blood pressure 124/65, pulse 73, temperature 98.3 F (36.8 C), height 5\' 7"  (1.702 m), weight 220 lb (99.8 kg), SpO2 98%. Body mass index is 34.46 kg/m.  General: Cooperative, alert, well developed, in no acute distress. HEENT: Conjunctivae and lids unremarkable. Cardiovascular: Regular rhythm.  Lungs: Normal work of breathing. Neurologic: No focal deficits.   Lab Results  Component Value Date   CREATININE 0.9 03/26/2023   BUN 23 (A) 03/26/2023   NA 136 (A) 03/26/2023   K 4.2 03/26/2023   CL 99 03/26/2023   CO2 29 (A) 03/26/2023   Lab Results  Component Value Date   ALT 9 (A) 03/26/2023   AST 16 03/26/2023   ALKPHOS 97 03/26/2023   BILITOT 0.4 09/29/2022   Lab Results  Component Value Date   HGBA1C 6.2 03/26/2023   HGBA1C 6.1 (H) 09/29/2022   HGBA1C 6.0 (H) 12/09/2020   HGBA1C 6.3 (H) 07/24/2020   HGBA1C 6.2 (H) 04/08/2020   Lab Results  Component Value Date   INSULIN  1.1 (L) 09/29/2022   INSULIN  12.3 07/24/2020   INSULIN  5.3 04/08/2020   Lab Results  Component Value Date   TSH 1.28 02/17/2022   Lab Results  Component Value Date   CHOL 157 12/09/2020   HDL 63 12/09/2020   LDLCALC 79 12/09/2020   TRIG 78 12/09/2020   CHOLHDL 2.8 07/26/2019   Lab Results  Component Value Date   VD25OH 49 03/26/2023   VD25OH 78.7 09/29/2022   VD25OH 72.2 12/09/2020   Lab Results  Component Value Date   WBC 4.5 03/26/2023   HGB 14.7 03/26/2023   HCT 45 03/26/2023   MCV 85.4 03/13/2022   PLT 230 03/13/2022   No results found for: "IRON", "TIBC", "FERRITIN"  Attestation Statements:   Reviewed by clinician on day of visit: allergies, medications, problem list, medical history, surgical history, family history, social  history, and previous encounter notes.  Time spent on visit including pre-visit chart review and post-visit care and charting was 28 minutes.  Focused on eating strategies, hydration habits, and reviewed medications.  I have reviewed the above documentation for accuracy and completeness, and I agree with the above. -  Marialy Urbanczyk d. Aeris Hersman, NP-C

## 2023-07-14 ENCOUNTER — Other Ambulatory Visit (INDEPENDENT_AMBULATORY_CARE_PROVIDER_SITE_OTHER): Payer: Self-pay | Admitting: *Deleted

## 2023-07-19 ENCOUNTER — Other Ambulatory Visit (HOSPITAL_COMMUNITY): Payer: Self-pay

## 2023-07-19 ENCOUNTER — Other Ambulatory Visit: Payer: Self-pay | Admitting: Cardiology

## 2023-07-19 ENCOUNTER — Other Ambulatory Visit: Payer: Self-pay | Admitting: Internal Medicine

## 2023-07-19 ENCOUNTER — Other Ambulatory Visit: Payer: Self-pay

## 2023-07-19 MED ORDER — CARVEDILOL 12.5 MG PO TABS
12.5000 mg | ORAL_TABLET | Freq: Two times a day (BID) | ORAL | 0 refills | Status: DC
  Filled 2023-07-19: qty 60, 30d supply, fill #0

## 2023-07-19 MED ORDER — OMEPRAZOLE 40 MG PO CPDR
40.0000 mg | DELAYED_RELEASE_CAPSULE | Freq: Two times a day (BID) | ORAL | 4 refills | Status: AC
  Filled 2023-07-19: qty 180, 90d supply, fill #0
  Filled 2024-01-19: qty 180, 90d supply, fill #1

## 2023-07-19 MED ORDER — CHLORTHALIDONE 25 MG PO TABS
12.5000 mg | ORAL_TABLET | Freq: Every day | ORAL | 3 refills | Status: AC
  Filled 2023-07-19: qty 45, 90d supply, fill #0
  Filled 2023-10-25: qty 45, 90d supply, fill #1
  Filled 2024-01-19: qty 45, 90d supply, fill #2

## 2023-07-27 DIAGNOSIS — E559 Vitamin D deficiency, unspecified: Secondary | ICD-10-CM | POA: Diagnosis not present

## 2023-07-27 DIAGNOSIS — L309 Dermatitis, unspecified: Secondary | ICD-10-CM | POA: Diagnosis not present

## 2023-07-27 DIAGNOSIS — Z79899 Other long term (current) drug therapy: Secondary | ICD-10-CM | POA: Diagnosis not present

## 2023-07-27 DIAGNOSIS — J441 Chronic obstructive pulmonary disease with (acute) exacerbation: Secondary | ICD-10-CM | POA: Diagnosis not present

## 2023-07-27 DIAGNOSIS — E049 Nontoxic goiter, unspecified: Secondary | ICD-10-CM | POA: Diagnosis not present

## 2023-07-27 DIAGNOSIS — I119 Hypertensive heart disease without heart failure: Secondary | ICD-10-CM | POA: Diagnosis not present

## 2023-07-27 DIAGNOSIS — J4530 Mild persistent asthma, uncomplicated: Secondary | ICD-10-CM | POA: Diagnosis not present

## 2023-07-27 DIAGNOSIS — I2510 Atherosclerotic heart disease of native coronary artery without angina pectoris: Secondary | ICD-10-CM | POA: Diagnosis not present

## 2023-07-27 DIAGNOSIS — E7800 Pure hypercholesterolemia, unspecified: Secondary | ICD-10-CM | POA: Diagnosis not present

## 2023-07-27 DIAGNOSIS — D638 Anemia in other chronic diseases classified elsewhere: Secondary | ICD-10-CM | POA: Diagnosis not present

## 2023-07-27 DIAGNOSIS — R7302 Impaired glucose tolerance (oral): Secondary | ICD-10-CM | POA: Diagnosis not present

## 2023-07-27 DIAGNOSIS — E669 Obesity, unspecified: Secondary | ICD-10-CM | POA: Diagnosis not present

## 2023-07-27 DIAGNOSIS — L239 Allergic contact dermatitis, unspecified cause: Secondary | ICD-10-CM | POA: Diagnosis not present

## 2023-07-27 LAB — LIPID PANEL
Cholesterol: 159 (ref 0–200)
HDL: 77 — AB (ref 35–70)
LDL Cholesterol: 69
LDl/HDL Ratio: 2.1
Triglycerides: 53 (ref 40–160)

## 2023-07-27 LAB — HEMOGLOBIN A1C: Hemoglobin A1C: 5.9

## 2023-07-27 LAB — HEPATIC FUNCTION PANEL
ALT: 17 U/L (ref 10–40)
AST: 29 (ref 14–40)
Alkaline Phosphatase: 102 (ref 25–125)

## 2023-07-27 LAB — BASIC METABOLIC PANEL WITH GFR
CO2: 18 (ref 13–22)
Chloride: 96 — AB (ref 99–108)
Creatinine: 0.8 (ref 0.6–1.3)
Glucose: 77
Potassium: 4.2 meq/L (ref 3.5–5.1)
Sodium: 133 — AB (ref 137–147)

## 2023-07-27 LAB — CBC AND DIFFERENTIAL
HCT: 49 (ref 41–53)
Hemoglobin: 16.2 (ref 13.5–17.5)
Platelets: 200 10*3/uL (ref 150–400)

## 2023-07-27 LAB — COMPREHENSIVE METABOLIC PANEL WITH GFR
Albumin: 4.3 (ref 3.5–5.0)
Calcium: 9.4 (ref 8.7–10.7)
Globulin: 2.7
eGFR: 91

## 2023-08-06 ENCOUNTER — Encounter: Payer: Self-pay | Admitting: Psychology

## 2023-08-06 NOTE — Progress Notes (Signed)
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 Above Average        D-KEFS Color-Word Interference Test: Raw Score (Scaled Score) Raw Score (Scaled Score) Percentile     Color Naming 35 secs. (9) 30 secs. (11) 63 Average    Word Reading 24 secs. (11) 20 secs. (13) 84 Above Average    Inhibition 89 secs. (7) 77 secs. (9) 37 Average      Total Errors 9 errors (4) 10 errors (3) 1 Exceptionally Low    Inhibition/Switching 75 secs. (10) 52 secs. (14) 91 Well Above Average      Total Errors 8 errors (5) 12 errors (1) <1 Exceptionally Low        D-KEFS Verbal Fluency Test: Raw Score (Scaled Score) Raw Score (Scaled Score) Percentile     Letter Total Correct 46 (13) 71 (19) >99 Exceptionally High    Category Total Correct 36 (11) 42 (14) 91 Well Above Average    Category Switching Total Correct 11 (9) 11 (9) 37 Average    Category Switching Accuracy 10 (9) 10 (9) 37 Average      Total Set Loss Errors 1 (11) 3 (9) 37 Average      Total Repetition Errors 2 (11) 4 (9) 37 Average        NAB  Executive Functions Module, Form 1:  T Score T Score Percentile     Judgment 53 58 79 Above Average        Language:      Verbal Fluency Test: Raw Score (T Score) Raw Score (T Score) Percentile     Phonemic Fluency (FAS) 46 (61) 71 (80) >99 Exceptionally High    Animal Fluency 17 (55) 24 (67) 96 Well Above Average         NAB Language Module, Form 1: T Score T Score Percentile     Auditory Comprehension 57 56 73 Average    Naming 31/31 (59) 31/31 (59) 82 Above Average        Visuospatial/Visuoconstruction:       Raw Score Raw Score Percentile   Clock Drawing: 8/10 8/10 --- Within Normal Limits        NAB Spatial Module, Form 1: T Score T Score Percentile     Visual Discrimination 33 33 5 Well Below Average         Scaled Score Scaled Score Percentile   WAIS-IV Block Design: 9 9 37 Average        Mood and Personality:       Raw Score Raw Score Percentile   Geriatric Depression Scale: 7 8 --- Within Normal Limits  Geriatric Anxiety Scale: 2 3 --- Minimal    Somatic 1 3 --- Minimal    Cognitive 1 0 --- Minimal    Affective 0 0 --- Minimal        Additional Questionnaires:       Raw Score Raw Score Percentile   PROMIS Sleep Disturbance Questionnaire: 21 19 --- None to Slight   Informed Consent and Coding/Compliance:   The current evaluation represents a clinical evaluation for the purposes previously outlined by the referral source and is in no way reflective of a forensic evaluation.   Mr. Heppler was provided with a verbal description of the nature and purpose of the present neuropsychological evaluation. Also reviewed were the foreseeable risks and/or discomforts and benefits of the procedure, limits of confidentiality, and mandatory reporting requirements of this provider. The patient was given the opportunity to ask questions and receive answers about the evaluation. Oral consent to participate was provided by the patient.   This evaluation was conducted by Melville Stade, Ph.D., ABPP-CN, board certified clinical neuropsychologist. Mr. Cansler completed a clinical interview with Dr. Kitty Perkins, billed as one unit [phone], and 125 minutes of cognitive testing and scoring, billed as one unit [phone] and three additional units 96139. Psychometrist Arnulfo Larch, B.S. assisted Dr. Kitty Perkins with test administration and scoring procedures. As a separate and discrete service, one unit [phone] and two units 96133 (155 minutes) were billed for Dr. Arsenio Larger time spent in interpretation and report writing.

## 2023-08-09 ENCOUNTER — Encounter: Payer: Self-pay | Admitting: Psychology

## 2023-08-09 ENCOUNTER — Ambulatory Visit: Payer: PPO | Admitting: Psychology

## 2023-08-09 ENCOUNTER — Ambulatory Visit: Payer: Self-pay | Admitting: Psychology

## 2023-08-09 DIAGNOSIS — R4189 Other symptoms and signs involving cognitive functions and awareness: Secondary | ICD-10-CM

## 2023-08-09 DIAGNOSIS — G3184 Mild cognitive impairment, so stated: Secondary | ICD-10-CM

## 2023-08-09 NOTE — Progress Notes (Signed)
   Psychometrician Note   Cognitive testing was administered to [employer] by Arnulfo Larch, B.S. (psychometrist) under the supervision of Dr. Melville Stade, Ph.D., ABPP, licensed psychologist on 08/09/2023. Richard [name] did not appear overtly distressed by the testing session per behavioral observation or responses across self-report questionnaires. Rest breaks were offered.    The battery of tests administered was selected by Dr. Melville Stade, Ph.D., ABPP with consideration to Richard [name]'s current level of functioning, the nature of his symptoms, emotional and behavioral responses during interview, level of literacy, observed level of motivation/effort, and the nature of the referral question. This battery was communicated to the psychometrist. Communication between Dr. Melville Stade, Ph.D., ABPP and the psychometrist was ongoing throughout the evaluation and Dr. Melville Stade, Ph.D., ABPP was immediately accessible at all times. Dr. Zachary C. Merz, Ph.D., ABPP provided supervision to the psychometrist on the date of this service to the extent necessary to assure the quality of all services provided.    Richard [name] will return within approximately 1-2 weeks for an interactive feedback session with Dr. Kitty Perkins at which time his test performances, clinical impressions, and treatment recommendations will be reviewed in detail. Richard [name] understands he can contact our office should he require our assistance before this time.  A total of 125 minutes of billable time were spent face-to-face with Richard [name] by the psychometrist. This includes both test administration and scoring time. Billing for these services is reflected in the clinical report generated by Dr. Melville Stade, Ph.D., ABPP  This note reflects time spent with the psychometrician and does not include test scores or any clinical interpretations made by Dr. Kitty Perkins. The full report will follow in a  separate note.

## 2023-08-16 ENCOUNTER — Encounter: Payer: PPO | Admitting: Psychology

## 2023-08-17 ENCOUNTER — Encounter: Admitting: Psychology

## 2023-08-17 ENCOUNTER — Telehealth (INDEPENDENT_AMBULATORY_CARE_PROVIDER_SITE_OTHER): Payer: Self-pay | Admitting: *Deleted

## 2023-08-17 ENCOUNTER — Ambulatory Visit (INDEPENDENT_AMBULATORY_CARE_PROVIDER_SITE_OTHER): Admitting: Adult Health

## 2023-08-17 ENCOUNTER — Other Ambulatory Visit (INDEPENDENT_AMBULATORY_CARE_PROVIDER_SITE_OTHER): Payer: Self-pay | Admitting: *Deleted

## 2023-08-17 VITALS — BP 133/69 | HR 89 | Temp 97.8°F | Ht 67.0 in | Wt 219.0 lb

## 2023-08-17 DIAGNOSIS — E871 Hypo-osmolality and hyponatremia: Secondary | ICD-10-CM

## 2023-08-17 DIAGNOSIS — E559 Vitamin D deficiency, unspecified: Secondary | ICD-10-CM | POA: Diagnosis not present

## 2023-08-17 DIAGNOSIS — E66812 Obesity, class 2: Secondary | ICD-10-CM

## 2023-08-17 DIAGNOSIS — Z6834 Body mass index (BMI) 34.0-34.9, adult: Secondary | ICD-10-CM | POA: Diagnosis not present

## 2023-08-17 DIAGNOSIS — G4733 Obstructive sleep apnea (adult) (pediatric): Secondary | ICD-10-CM

## 2023-08-17 DIAGNOSIS — I10 Essential (primary) hypertension: Secondary | ICD-10-CM | POA: Diagnosis not present

## 2023-08-17 DIAGNOSIS — R7303 Prediabetes: Secondary | ICD-10-CM | POA: Diagnosis not present

## 2023-08-17 DIAGNOSIS — E669 Obesity, unspecified: Secondary | ICD-10-CM | POA: Diagnosis not present

## 2023-08-17 NOTE — Progress Notes (Signed)
 WEIGHT SUMMARY AND BIOMETRICS  Vitals Temp: 97.8 F (36.6 C) BP: 133/69 Pulse Rate: 89 SpO2: 99 %   Anthropometric Measurements Height: 5\' 7"  (1.702 m) Weight: 219 lb (99.3 kg) BMI (Calculated): 34.29 Weight at Last Visit: 220 lb Weight Lost Since Last Visit: 1 lb Weight Gained Since Last Visit: 0 Starting Weight: 258 lb Total Weight Loss (lbs): 39 lb (17.7 kg)   Body Composition  Body Fat %: 32.6 % Fat Mass (lbs): 71.6 lbs Muscle Mass (lbs): 140.6 lbs Total Body Water (lbs): 96.2 lbs Visceral Fat Rating : 22   Other Clinical Data Fasting: yes Labs: yes Today's Visit #: 34 Starting Date: 04/08/20    Chief Complaint:   OBESITY Richard [name] is here to discuss his progress with his obesity treatment plan.  He is on the the Category 2 Plan and states he is following his eating plan approximately 70 % of the time.  He states he is exercising Golfing 4 hrs 2 times per week.  Interim History:  08/09/2203 OV with [facility], re: Mild Cognitive Impairment of uncertain or unknown etiology. Briefly reviewed Dr. Arsenio Larger detailed OVs Hearing aids and regular BiPaP use recommended. He has f/u with Dr. Kitty Perkins next week- encouraged to bring all questions/concerns to Neurology OV  Per pt- he had chronic f/u with PCP/Dr. Eladio Graven last month Will request lab results from PCP  Subjective:   1. Prediabetes Lab Results  Component Value Date   HGBA1C 6.2 03/26/2023   HGBA1C 6.1 (H) 09/29/2022   HGBA1C 6.0 (H) 12/09/2020     Latest Reference Range & Units 09/29/22 14:10  INSULIN  2.6 - 24.9 uIU/mL 1.1 (L)  (L): Data is abnormally low  2023: Started of Farxiga  for HFpEF and improvement in symptoms.  He is currently on daily Farxiga  10mg  each morning He denies breakthrough polyphagia  2. Vitamin D  deficiency  Latest Reference Range & Units 09/29/22 14:10 03/26/23 08:36  Vitamin D , 25-Hydroxy 30.0 - 100.0 ng/mL 78.7 49 (E)  (E): External lab result  PCP manages  weekly Ergocalciferol - denies N/V/Muscle Weakness   3. Essential hypertension Home Readings SBP: 120-130s DBP: 70s He denies CP with exertion  BP excellent and at goal at OV 2023: Started of Farxiga  for HFpEF and improvement in symptoms.  He is currently on daily Farxiga  10mg  each morning  He denies dyspnea  4. Hyponatremia  Latest Reference Range & Units 02/17/22 00:00 03/13/22 08:51 09/29/22 14:10 03/26/23 08:36  Sodium 137 - 147  134 ! (E) 128 (L) 135 136 ! (E)  !: Data is abnormal (L): Data is abnormally low (E): External lab result  He denies excessive fatigue, nausea, confusion and seizure activity. He has been adding a few shakes Na+ on his food the last several weeks  5. OSA treated with BiPAP He reports nightly use of BiPAP, will average 4-8 hrs per night. He feels that the mask is claustrophobic   06/02/2023 Cards OV Notes: Chief Complaint:  OSA   History of Present Illness:     Richard [name] is a 78 y.o. male with a hx of Obesity, snoring and witnessed Apnea.  He was referred for sleep study which showed mild OSA with an AHI of 13.5/hr and O2 sats of 88% with events.  He underwent BiPAP titration to 13/9cm H2O.     He is doing well with his PAP device and thinks that he has gotten used to it.  He tolerates the full face mask and feels the  pressure is adequate.  Since going on PAP he feels rested in the am and has no significant daytime sleepiness.  He denies any significant mouth or nasal dryness or nasal congestion.  He does not think that he snores.    ASSESSMENT & PLAN:     1.  OSA - The patient is tolerating PAP therapy well without any problems. The PAP download performed by his DME was personally reviewed and interpreted by me today and showed an AHI of 4.5/hr on 13/9 cm H2O with 90% compliance in using more than 4 hours nightly.  The patient has been using and benefiting from PAP use and will continue to benefit from therapy.   Assessment/Plan:   1.  Prediabetes (Primary) Check Labs - Hemoglobin A1c - Insulin , random  2. Vitamin D  deficiency Check Labs - VITAMIN D  25 Hydroxy (Vit-D Deficiency, Fractures)  3. Essential hypertension Monitor home BP  4. Hyponatremia Continue to use a few shakes of Na+ on her food  5. OSA treated with BiPAP Check Labs - CBC Increase nightly BiPaP use  6. Obesity, Current BMI 34.5  Richard [name] is currently in the action stage of change. As such, his goal is to continue with weight loss efforts. He has agreed to the Category 2 Plan.   Exercise goals: Older adults should follow the adult guidelines. When older adults cannot meet the adult guidelines, they should be as physically active as their abilities and conditions will allow.  Older adults should do exercises that maintain or improve balance if they are at risk of falling.  Older adults should determine their level of effort for physical activity relative to their level of fitness.  Older adults with chronic conditions should understand whether and how their conditions affect their ability to do regular physical activity safely.  Behavioral modification strategies: increasing lean protein intake, decreasing simple carbohydrates, increasing vegetables, increasing water intake, no skipping meals, meal planning and cooking strategies, keeping healthy foods in the home, ways to avoid boredom eating, and planning for success.  Richard [name] has agreed to follow-up with our clinic in 4 weeks. He was informed of the importance of frequent follow-up visits to maximize his success with intensive lifestyle modifications for his multiple health conditions.   Richard [name] was informed we would discuss his lab results at his next visit unless there is a critical issue that needs to be addressed sooner. Richard [name] agreed to keep his next visit at the agreed upon time to discuss these results.  Objective:   Blood pressure 133/69, pulse 89, temperature 97.8 F (36.6 C), height 5\' 7"   (1.702 m), weight 219 lb (99.3 kg), SpO2 99%. Body mass index is 34.3 kg/m.  General: Cooperative, alert, well developed, in no acute distress. HEENT: Conjunctivae and lids unremarkable. Cardiovascular: Regular rhythm.  Lungs: Normal work of breathing. Neurologic: No focal deficits.   Lab Results  Component Value Date   CREATININE 0.9 03/26/2023   BUN 23 (A) 03/26/2023   NA 136 (A) 03/26/2023   K 4.2 03/26/2023   CL 99 03/26/2023   CO2 29 (A) 03/26/2023   Lab Results  Component Value Date   ALT 9 (A) 03/26/2023   AST 16 03/26/2023   ALKPHOS 97 03/26/2023   BILITOT 0.4 09/29/2022   Lab Results  Component Value Date   HGBA1C 6.2 03/26/2023   HGBA1C 6.1 (H) 09/29/2022   HGBA1C 6.0 (H) 12/09/2020   HGBA1C 6.3 (H) 07/24/2020   HGBA1C 6.2 (H) 04/08/2020   Lab Results  Component  Value Date   INSULIN  1.1 (L) 09/29/2022   INSULIN  12.3 07/24/2020   INSULIN  5.3 04/08/2020   Lab Results  Component Value Date   TSH 1.28 02/17/2022   Lab Results  Component Value Date   CHOL 129 03/26/2023   HDL 56 03/26/2023   LDLCALC 59 03/26/2023   TRIG 60 03/26/2023   CHOLHDL 2.8 07/26/2019   Lab Results  Component Value Date   VD25OH 49 03/26/2023   VD25OH 78.7 09/29/2022   VD25OH 72.2 12/09/2020   Lab Results  Component Value Date   WBC 4.5 03/26/2023   HGB 14.7 03/26/2023   HCT 45 03/26/2023   MCV 85.4 03/13/2022   PLT 230 03/13/2022   No results found for: "IRON", "TIBC", "FERRITIN"  Attestation Statements:   Reviewed by clinician on day of visit: allergies, medications, problem list, medical history, surgical history, family history, social history, and previous encounter notes.  I have reviewed the above documentation for accuracy and completeness, and I agree with the above. -  Harriett [name] d. Jacarra Bobak, NP-C

## 2023-08-17 NOTE — Telephone Encounter (Signed)
 Called Dr Kilpatrick's office to request most recent labs per [facility] request. They will be faxing them over.

## 2023-08-18 LAB — CBC
Hematocrit: 48.1 % (ref 37.5–51.0)
Hemoglobin: 15.8 g/dL (ref 13.0–17.7)
MCH: 31.7 pg (ref 26.6–33.0)
MCHC: 32.8 g/dL (ref 31.5–35.7)
MCV: 97 fL (ref 79–97)
Platelets: 187 10*3/uL (ref 150–450)
RBC: 4.98 x10E6/uL (ref 4.14–5.80)
RDW: 14.2 % (ref 11.6–15.4)
WBC: 4.1 10*3/uL (ref 3.4–10.8)

## 2023-08-18 LAB — HEMOGLOBIN A1C
Est. average glucose Bld gHb Est-mCnc: 120 mg/dL
Hgb A1c MFr Bld: 5.8 % — ABNORMAL HIGH (ref 4.8–5.6)

## 2023-08-18 LAB — INSULIN, RANDOM: INSULIN: 4.9 u[IU]/mL (ref 2.6–24.9)

## 2023-08-18 LAB — VITAMIN D 25 HYDROXY (VIT D DEFICIENCY, FRACTURES): Vit D, 25-Hydroxy: 68.8 ng/mL (ref 30.0–100.0)

## 2023-08-23 ENCOUNTER — Ambulatory Visit: Admitting: Psychology

## 2023-08-23 ENCOUNTER — Other Ambulatory Visit: Payer: Self-pay | Admitting: Internal Medicine

## 2023-08-23 DIAGNOSIS — G3184 Mild cognitive impairment, so stated: Secondary | ICD-10-CM | POA: Diagnosis not present

## 2023-08-23 NOTE — Progress Notes (Signed)
   Neuropsychology Feedback Session Richard [name]. [facility] [place] Department of Neurology  Reason for Referral:   [name] is a 78 y.o. right-handed African-American male referred by Tex Filbert, PA-C, to characterize his current cognitive functioning and assist with diagnostic clarity and treatment planning in the context of a previously diagnosed mild neurocognitive disorder and concern for progressive cognitive decline.   Feedback:   [name] completed a comprehensive neuropsychological evaluation on 08/09/2023. Please refer to that encounter for the full report and recommendations. Briefly, results suggested impairment surrounding encoding (i.e., learning) aspects of verbal memory. Isolated impairments were also exhibited across response inhibition and a task assessing visual discrimination. However, all other tasks assessing executive functioning and visuospatial abilities respectively were appropriate. Relative to his previous evaluation in July 2023, mild improvement could be argued across processing speed, basic attention, and phonemic fluency. Outside of this, all other domains exhibited relative stability. No cognitive domain exhibited appreciable decline. The etiology for mild dysfunction remains uncertain. [name] has documented hearing loss via formalized testing and has not yet been willing to utilize hearing aids. Ongoing hearing loss could certainly be a primary culprit for his isolated impairment learning novel verbal information. This, in combination with chronic medical ailments, especially variable BiPAP adherence and ailments cardiovascular in nature, superimposed on the normal aging process, would appear the most likely explanation based on current testing and stability over time. Despite trouble learning novel information, delayed retention was appropriate. Overall, memory performance combined with intact performances across other areas of  cognitive functioning continues to not be suggestive of presently symptomatic Alzheimer's disease.   [name] was accompanied by his wife during the current feedback session. Content of the current session focused on the results of his neuropsychological evaluation. [name] was given the opportunity to ask questions and his questions were answered. He was encouraged to reach out should additional questions arise. A copy of his report was provided at the conclusion of the visit.      One unit 96132 (31 minutes) was billed for Dr. Arsenio Larger time spent preparing for, conducting, and documenting the current feedback session with [name].

## 2023-08-24 ENCOUNTER — Other Ambulatory Visit (HOSPITAL_COMMUNITY): Payer: Self-pay

## 2023-08-24 ENCOUNTER — Other Ambulatory Visit: Payer: Self-pay

## 2023-08-24 MED ORDER — CARVEDILOL 12.5 MG PO TABS
12.5000 mg | ORAL_TABLET | Freq: Two times a day (BID) | ORAL | 5 refills | Status: DC
  Filled 2023-08-24: qty 60, 30d supply, fill #0
  Filled 2023-09-21: qty 60, 30d supply, fill #1
  Filled 2023-10-25: qty 60, 30d supply, fill #2
  Filled 2023-11-24: qty 60, 30d supply, fill #3
  Filled 2023-12-20: qty 60, 30d supply, fill #4
  Filled 2024-01-19: qty 60, 30d supply, fill #5

## 2023-09-03 ENCOUNTER — Other Ambulatory Visit (HOSPITAL_COMMUNITY): Payer: Self-pay

## 2023-09-03 ENCOUNTER — Ambulatory Visit: Admitting: Physician Assistant

## 2023-09-03 ENCOUNTER — Encounter: Payer: Self-pay | Admitting: Physician Assistant

## 2023-09-03 VITALS — BP 139/71 | HR 76 | Resp 20 | Ht 67.0 in | Wt 228.0 lb

## 2023-09-03 DIAGNOSIS — G3184 Mild cognitive impairment, so stated: Secondary | ICD-10-CM

## 2023-09-03 MED ORDER — DONEPEZIL HCL 5 MG PO TABS
5.0000 mg | ORAL_TABLET | Freq: Every day | ORAL | 3 refills | Status: DC
  Filled 2023-09-03: qty 90, 90d supply, fill #0

## 2023-09-03 MED ORDER — DONEPEZIL HCL 5 MG PO TABS
5.0000 mg | ORAL_TABLET | Freq: Every day | ORAL | 3 refills | Status: DC
  Filled 2023-09-03: qty 90, 90d supply, fill #0
  Filled 2023-12-13: qty 90, 90d supply, fill #1
  Filled 2024-03-11: qty 90, 90d supply, fill #2

## 2023-09-03 NOTE — Progress Notes (Signed)
 Assessment/Plan:    Mild cognitive impairment of uncertain or unknown etiology  Richard [name] is a very pleasant 78 y.o. RH male with a history of COPD, prediabetes, OSA on BiPAP, CHF, HOH, B12 deficiency, extensive cardiac history, strong family history of Alzheimer's disease  and a diagnosis of MCI of unclear etiology per neuropsych evaluation June 2025,*** presenting today in follow-up for evaluation of memory loss.  Continues to not being compliant with his BiPAP and with his hearing aids, which likely will continue contribute to memory issues.  Once again we discussed the necessity of using both devices.  Patient is on ***.     Recommendations:   Follow up in   months. Continue donepezil  5 mg daily, side effects discussed Recommend good control of cardiovascular risk factors Continue to control mood as per PCP Continue to check hearing, use the hearing aids Used BiPAP for OSA Replenish B12    Subjective:   This patient is accompanied in the office by ***  who supplements the history. Previous records as well as any outside records available were reviewed prior to todays visit.   Patient was last seen on 04/14/2023.***.    Any changes in memory since last visit? About the same.  He continues to have short-term memory difficulties, especially with new information, conversations.  He enjoys crossword puzzles, reading novels.  He writes at list when he was shopping.  He also enjoys playing golf.  He does not like social activities repeats oneself?  Endorsed, not frequently Disoriented when walking into a room?  Patient denies ***  Misplacing objects?  Patient denies   Wandering behavior?   Denies. Any personality changes since last visit? Denies.   Any worsening depression?: denies.   Hallucinations or paranoia?  Denies.   Seizures?   Denies.    Any sleep changes? Sleeps well. Denies vivid dreams, REM behavior or sleepwalking   Sleep apnea?  Endorsed,compliant with  BiPAP***  Any hygiene concerns?   Denies.   Independent of bathing and dressing?  Endorsed  Does the patient needs help with medications? Patient is in charge *** Who is in charge of the finances?  Wife is in charge   *** Any changes in appetite?  He tries to eat healthy***   Patient have trouble swallowing?  Denies.   Does the patient cook?  Yes, not frequently. Any kitchen accidents such as leaving the stove on?   Denies.   Any headaches?    Denies.   Vision changes? Denies. Had R cataract removed, can see better Chronic pain?  Denies.   Ambulates with difficulty?    Denies, he likes playing golf. ***  Recent falls or head injuries?    Denies.      Unilateral weakness, numbness or tingling?  Denies.   Any tremors?  Denies.   Any anosmia?   He has resistance to smell after sinus procedure in the past Any incontinence of urine?  Has nocturia due to BPH, takes Flomax  Any bowel dysfunction?  Denies.      Patient lives with his wife.*** Does the patient drive?  Yes, only short distances, does not drive frequently.***  Neuropsych evaluation 08/09/2023, Dr. Richie Briefly, results suggested impairment surrounding encoding (i.e., learning) aspects of verbal memory. Isolated impairments were also exhibited across response inhibition and a task assessing visual discrimination. However, all other tasks assessing executive functioning and visuospatial abilities respectively were appropriate. Relative to his previous evaluation in July 2023, mild improvement could be argued across  processing speed, basic attention, and phonemic fluency. Outside of this, all other domains exhibited relative stability. No cognitive domain exhibited appreciable decline. The etiology for mild dysfunction remains uncertain. Mr. Dewalt has documented hearing loss via formalized testing and has not yet been willing to utilize hearing aids. Ongoing hearing loss could certainly be a primary culprit for his isolated impairment  learning novel verbal information. This, in combination with chronic medical ailments, especially variable BiPAP adherence and ailments cardiovascular in nature, superimposed on the normal aging process, would appear the most likely explanation based on current testing and stability over time. Despite trouble learning novel information, delayed retention was appropriate. Overall, memory performance combined with intact performances across other areas of cognitive functioning continues to not be suggestive of presently symptomatic Alzheimer's disease.     Initial Visit 03/27/21 The patient is seen in neurologic consultation at the request of Hillman Bare, MD for the evaluation of memory.  The patient is accompanied by his wife who supplements the history. This is a 78 y.o. year old RH  male who has had memory issues for about a while during the visit to their daughter in [place] , she noticed that there was a mild decline in his father's cognition.  He was asking the same questions within minutes, and if confronted about it, he would become defensive.  He is less detail oriented than before.  He denies repeating the same stories.  Sometimes, he cannot find what he is looking for when he gets to a room.  His mood may be changing, every day he does his wife has not been a good day, has not started well but when asked to elaborate, he snaps he continues to drive without any issues.  He enjoys doing crossword puzzles and word finding.  He sleeps well, except when he has to get up to urinate which is between 3 and 4 times a night.  His wife states that he tosses and turns and his hands move during the night, especially than in the last 8 months, but he denies having any vivid dreams.  He snores even with CPAP.  He denies any sleepwalking.  He denies any hallucinations or paranoia.  No hygiene concerns.  His medications are in a pillbox, he denies forgetting any doses.  His wife always did the finances.  His  appetite is good, denies trouble swallowing.  Over the last 8 months, he has been in and controlled diet, but he discontinued all forms of sodium intake, which in turn turned to be provoking hyponatremia.  He is all or nothing-his wife says .  He cooks and denies leaving the stove on accidentally or forgetting common recipe.  He denies any head injuries, falls, seizure, or history of headaches.  He denies any vision changes, dizziness, focal numbness or tingling, unilateral weakness or tremors or anosmia.  No reported incontinence, retention, constipation or diarrhea.  He denies any alcohol, or tobacco.  Family history strong for Alzheimer's disease in mother, and her 4 sisters.  He is retired from [employer], he retired 4 years ago     MRI brain 04/26/21, personally reviewed no acute intracranial abnormality. Mild for age signal changes.suggestive of chronic small vessel disease. 2. 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    Past Medical History:  Diagnosis Date   Acid reflux disease    Arthritis    Asthma 04/30/2013   Atherosclerosis of coronary artery 04/30/2013  Ventricular fibrillation; circumflex stent x2 in 2004   Benign essential hypertension    CAP (community acquired pneumonia) 04/30/2013   Chronic heart failure with preserved ejection fraction (HFpEF) 04/17/2022   Class 2 severe obesity with serious comorbidity and body mass index (BMI) of 39.0 to 39.9 in adult 06/04/2020   COPD, 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   Dyspnea on exertion 09/13/2017   Edema, lower extremity    Essential hypertension 12/02/2021   Excessive cerumen in both ear canals 01/05/2022   Fatigue 01/06/2022   Heart failure, diastolic    Hemorrhage of rectum and anus    History of colonic polyps    Hyperlipidemia 06/26/2013   Hypoxia 04/30/2013   Influenza due to identified novel influenza A virus with other respiratory  manifestations 05/01/2013   Internal hemorrhoids    Mild cognitive impairment of uncertain or unknown etiology 09/30/2021   Myocardial infarction 2004   Dr. Claudene, at [place], yearly visits   OSA treated with BiPAP 04/24/2018   Mild OSA with an AHI of 13.5/hr and O2 sats of 88%. Now on BiPAP at 13/9cm H2O.    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   Prediabetes 04/09/2020   Pure hypercholesterolemia    Right bundle branch block 06/27/2014    EKG performed 06/27/2014   Transient total loss of muscle tone 01/06/2022   Vitamin D  deficiency      Past Surgical History:  Procedure Laterality Date   CARDIAC CATHETERIZATION  2004   with 2 stents   CARDIOVASCULAR STRESS TEST  2011   COLONOSCOPY     POLYPECTOMY  02/17/2011   Procedure: POLYPECTOMY NASAL;  Surgeon: Alm LITTIE Bouche;  Location: [facility] OR;  Service: ENT;  Laterality: N/A;   SINUS ENDO W/FUSION  02/17/2011   Procedure: ENDOSCOPIC SINUS SURGERY WITH FUSION NAVIGATION;  Surgeon: Alm LITTIE Bouche;  Location: [facility] OR;  Service: ENT;  Laterality: N/A;   TONSILLECTOMY     US  ECHOCARDIOGRAPHY       PREVIOUS MEDICATIONS:   CURRENT MEDICATIONS:  Outpatient Encounter Medications as of 09/03/2023  Medication Sig   amLODipine  (NORVASC ) 5 MG tablet Take 1 tablet (5 mg total) by mouth daily.   aspirin  EC 81 MG tablet Take 1 tablet (81 mg total) by mouth daily.   atorvastatin  (LIPITOR) 80 MG tablet Take 1 tablet (80 mg total) by mouth at bedtime.   bisacodyl  5 MG EC tablet use as directed   carvedilol  (COREG ) 12.5 MG tablet Take 1 tablet (12.5 mg total) by mouth 2 (two) times daily.   chlorthalidone  (HYGROTON ) 25 MG tablet Take 1/2 tablet (12.5 mg total) by mouth daily.   COVID-19 mRNA Vac-TriS, Pfizer, (PFIZER-BIONT COVID-19 VAC-TRIS) SUSP injection Inject into the muscle.   Cyanocobalamin  (VITAMIN B 12 PO) Take by mouth.   dapagliflozin  propanediol (FARXIGA ) 10 MG TABS tablet Take 1 tablet (10 mg total) by mouth daily before breakfast.    donepezil  (ARICEPT ) 5 MG tablet Take 1 tablet (5 mg total) by mouth daily.   ezetimibe  (ZETIA ) 10 MG tablet Take 1 tablet (10 mg total) by mouth daily.   fluticasone  furoate-vilanterol (BREO ELLIPTA ) 200-25 MCG/ACT AEPB Inhale 1 puff into the lungs daily.   guaiFENesin  (MUCINEX ) 600 MG 12 hr tablet Take by mouth 2 (two) times daily.   ipratropium-albuterol  (DUONEB) 0.5-2.5 (3) MG/3ML SOLN Inhale 3 mLs by nebulization 2 (two) times daily and once at bedtime as needed.   montelukast  (SINGULAIR ) 10 MG tablet Take 1 tablet by mouth daily   nitroGLYCERIN  (NITROSTAT ) 0.4 MG SL  tablet Place 1 tablet (0.4 mg total) under the tongue every 5 (five) minutes as needed. For chest pain   omeprazole  (PRILOSEC) 40 MG capsule Take 1 capsule (40 mg total) by mouth 2 (two) times daily.   polyethylene glycol powder (GLYCOLAX/MIRALAX) 17 GM/SCOOP powder See admin instructions.   polyethylene glycol-electrolytes (NULYTELY) 420 g solution Use as directed   potassium chloride  (MICRO-K ) 10 MEQ CR capsule Take 1 capsule (10 mEq total) by mouth daily.   tamsulosin  (FLOMAX ) 0.4 MG CAPS capsule Take 1 capsule (0.4 mg total) by mouth daily.   Vitamin D , Ergocalciferol , 50000 units CAPS Take 1 capsule by mouth weekly   No facility-administered encounter medications on file as of 09/03/2023.     Objective:     PHYSICAL EXAMINATION:    VITALS:  There were no vitals filed for this visit.  GEN:  The patient appears stated age and is in NAD. HEENT:  Normocephalic, atraumatic.   Neurological examination:  General: NAD, well-groomed, appears stated age. Orientation: The patient is alert. Oriented to person, place and not to date.*** Cranial nerves: There is good facial symmetry.The speech is fluent and clear. No aphasia or dysarthria. Fund of knowledge is appropriate. Recent memory impaired and remote memory is normal.  Attention and concentration are normal.  Able to name objects and repeat phrases.  Hearing is intact to  conversational tone ***.   Delayed recall *** Sensation: Sensation is intact to light touch throughout Motor: Strength is at least antigravity x4. DTR's 2/4 in UE/LE      03/27/2021   10:00 AM  Montreal Cognitive Assessment   Visuospatial/ Executive (0/5) 2  Naming (0/3) 3  Attention: Read list of digits (0/2) 2  Attention: Read list of letters (0/1) 1  Attention: Serial 7 subtraction starting at 100 (0/3) 3  Language: Repeat phrase (0/2) 2  Language : Fluency (0/1) 1  Abstraction (0/2) 2  Delayed Recall (0/5) 4  Orientation (0/6) 6  Total 26       04/14/2023    2:00 PM 10/21/2022    8:00 AM 10/12/2022    5:00 PM  MMSE - Mini Mental State Exam  Orientation to time 4 5 5   Orientation to Place 5 5 5   Registration 3 3 3   Attention/ Calculation 5 5 5   Recall 2 3 3   Language- name 2 objects 2 2 2   Language- repeat 1 1 1   Language- follow 3 step command 3 3 3   Language- read & follow direction 1 1 1   Write a sentence 1 1 1   Copy design 1 0 1  Total score 28 29 30        Movement examination: Tone: There is normal tone in the UE/LE Abnormal movements:  no tremor.  No myoclonus.  No asterixis.   Coordination:  There is no decremation with RAM's. Normal finger to nose  Gait and Station: The patient has no difficulty arising out of a deep-seated chair without the use of the hands. The patient's stride length is good.  Gait is cautious and narrow.  He has decreased arm swing.  Thank you for allowing us  the opportunity to participate in the care of this nice patient. Please do not hesitate to contact us  for any questions or concerns.   Total time spent on today's visit was *** minutes dedicated to this patient today, preparing to see patient, examining the patient, ordering tests and/or medications and counseling the patient, documenting clinical information in the EHR or other health  record, independently interpreting results and communicating results to the patient/family, discussing  treatment and goals, answering patient's questions and coordinating care.  Cc:  Hillman Bare, MD  Camie Sevin 09/03/2023 5:57 AM

## 2023-09-03 NOTE — Patient Instructions (Signed)
 It was a pleasure to see you today at our office.   Recommendations:  Follow up in  6 months donepezil   5 mg daily.      Whom to call:  Memory  decline, memory medications: Call our office [phone]          RECOMMENDATIONS FOR ALL PATIENTS WITH MEMORY PROBLEMS: 1. Continue to exercise (Recommend 30 minutes of walking everyday, or 3 hours every week) 2. Increase social interactions - continue going to [place] and enjoy social gatherings with friends and family 3. Eat healthy, avoid fried foods and eat more fruits and vegetables 4. Maintain adequate blood pressure, blood sugar, and blood cholesterol level. Reducing the risk of stroke and cardiovascular disease also helps promoting better memory. 5. Avoid stressful situations. Live a simple life and avoid aggravations. Organize your time and prepare for the next day in anticipation. 6. Sleep well, avoid any interruptions of sleep and avoid any distractions in the bedroom that may interfere with adequate sleep quality 7. Avoid sugar, avoid sweets as there is a strong link between excessive sugar intake, diabetes, and cognitive impairment We discussed the Mediterranean diet, which has been shown to help patients reduce the risk of progressive memory disorders and reduces cardiovascular risk. This includes eating fish, eat fruits and green leafy vegetables, nuts like almonds and hazelnuts, walnuts, and also use olive oil. Avoid fast foods and fried foods as much as possible. Avoid sweets and sugar as sugar use has been linked to worsening of memory function.  There is always a concern of gradual progression of memory problems. If this is the case, then we may need to adjust level of care according to patient needs. Support, both to the patient and caregiver, should then be put into place.    FALL PRECAUTIONS: Be cautious when walking. Scan the area for obstacles that may increase the risk of trips and falls. When getting up in the mornings,  sit up at the edge of the bed for a few minutes before getting out of bed. Consider elevating the bed at the head end to avoid drop of blood pressure when getting up. Walk always in a well-lit room (use night lights in the walls). Avoid area rugs or power cords from appliances in the middle of the walkways. Use a walker or a cane if necessary and consider physical therapy for balance exercise. Get your eyesight checked regularly.  FINANCIAL OVERSIGHT: Supervision, especially oversight when making financial decisions or transactions is also recommended.  HOME SAFETY: Consider the safety of the kitchen when operating appliances like stoves, microwave oven, and blender. Consider having supervision and share cooking responsibilities until no longer able to participate in those. Accidents with firearms and other hazards in the house should be identified and addressed as well.   ABILITY TO BE LEFT ALONE: If patient is unable to contact 911 operator, consider using [employer], or when the need is there, arrange for someone to stay with patients. Smoking is a fire hazard, consider supervision or cessation. Risk of wandering should be assessed by caregiver and if detected at any point, supervision and safe proof recommendations should be instituted.  MEDICATION SUPERVISION: Inability to self-administer medication needs to be constantly addressed. Implement a mechanism to ensure safe administration of the medications.   DRIVING: Regarding driving, in patients with progressive memory problems, driving will be impaired. We advise to have someone else do the driving if trouble finding directions or if minor accidents are reported. Independent driving assessment is available  to determine safety of driving.   If you are interested in the driving assessment, you can contact the following:  [facility] [employer] in [place] [phone]  [facility] [phone]  [facility]  [phone]  [facility] [phone] or [phone]

## 2023-09-11 ENCOUNTER — Other Ambulatory Visit (HOSPITAL_COMMUNITY): Payer: Self-pay

## 2023-09-14 ENCOUNTER — Other Ambulatory Visit (HOSPITAL_COMMUNITY): Payer: Self-pay

## 2023-09-15 DIAGNOSIS — G4733 Obstructive sleep apnea (adult) (pediatric): Secondary | ICD-10-CM | POA: Diagnosis not present

## 2023-09-16 ENCOUNTER — Other Ambulatory Visit: Payer: Self-pay

## 2023-09-16 ENCOUNTER — Other Ambulatory Visit (HOSPITAL_COMMUNITY): Payer: Self-pay

## 2023-09-16 MED ORDER — FLUTICASONE FUROATE-VILANTEROL 200-25 MCG/ACT IN AEPB
1.0000 | INHALATION_SPRAY | Freq: Every day | RESPIRATORY_TRACT | 3 refills | Status: AC
  Filled 2023-09-16: qty 180, 90d supply, fill #0
  Filled 2023-12-15: qty 180, 90d supply, fill #1

## 2023-09-21 ENCOUNTER — Other Ambulatory Visit (HOSPITAL_COMMUNITY): Payer: Self-pay

## 2023-09-23 ENCOUNTER — Other Ambulatory Visit (HOSPITAL_COMMUNITY): Payer: Self-pay

## 2023-09-23 MED ORDER — MONTELUKAST SODIUM 10 MG PO TABS
10.0000 mg | ORAL_TABLET | Freq: Every day | ORAL | 3 refills | Status: AC
  Filled 2023-09-23: qty 90, 90d supply, fill #0
  Filled 2023-12-20: qty 90, 90d supply, fill #1
  Filled 2024-03-20: qty 90, 90d supply, fill #2

## 2023-09-28 ENCOUNTER — Encounter (INDEPENDENT_AMBULATORY_CARE_PROVIDER_SITE_OTHER): Payer: Self-pay | Admitting: Adult Health

## 2023-09-28 ENCOUNTER — Ambulatory Visit (INDEPENDENT_AMBULATORY_CARE_PROVIDER_SITE_OTHER): Admitting: Adult Health

## 2023-09-28 VITALS — BP 124/66 | HR 71 | Temp 98.9°F | Ht 67.0 in | Wt 223.0 lb

## 2023-09-28 DIAGNOSIS — E66812 Obesity, class 2: Secondary | ICD-10-CM

## 2023-09-28 DIAGNOSIS — E559 Vitamin D deficiency, unspecified: Secondary | ICD-10-CM | POA: Diagnosis not present

## 2023-09-28 DIAGNOSIS — R7303 Prediabetes: Secondary | ICD-10-CM

## 2023-09-28 DIAGNOSIS — E669 Obesity, unspecified: Secondary | ICD-10-CM | POA: Diagnosis not present

## 2023-09-28 DIAGNOSIS — R413 Other amnesia: Secondary | ICD-10-CM

## 2023-09-28 DIAGNOSIS — G4733 Obstructive sleep apnea (adult) (pediatric): Secondary | ICD-10-CM | POA: Diagnosis not present

## 2023-09-28 DIAGNOSIS — Z6835 Body mass index (BMI) 35.0-35.9, adult: Secondary | ICD-10-CM | POA: Diagnosis not present

## 2023-09-28 NOTE — Progress Notes (Signed)
 WEIGHT SUMMARY AND BIOMETRICS  Vitals Temp: 98.9 F (37.2 C) BP: 124/66 Pulse Rate: 71 SpO2: 98 %   Anthropometric Measurements Height: 5' 7 (1.702 m) Weight: 223 lb (101.2 kg) BMI (Calculated): 34.92 Weight at Last Visit: 219 lb Weight Lost Since Last Visit: 0 Weight Gained Since Last Visit: 4 lb Starting Weight: 258 lb Total Weight Loss (lbs): 35 lb (15.9 kg)   Body Composition  Body Fat %: 33.2 % Fat Mass (lbs): 74.4 lbs Muscle Mass (lbs): 142 lbs Total Body Water (lbs): 98.4 lbs Visceral Fat Rating : 22   Other Clinical Data Fasting: no Labs: no Today's Visit #: 42 Starting Date: 04/08/20    Chief Complaint:   OBESITY Richard [name] is here to discuss his progress with his obesity treatment plan.  He is on the the Category 2 Plan and states he is following his eating plan approximately 65 % of the time.  He states he is exercising Gold 120 minutes 2 times per week. He will walk Front 9 holes when golfing  Interim History:  He has been evaluated by Neurology Team, 08/23/2023 and 09/03/2023  He celebrated his [employer] Reunion last week, Class of 1965  Reviewed Bioimpedance Results with pt: Muscle Mass: +1.4 lbs Adipose Mass: +2.8 lbs  Subjective:   1. OSA treated with BiPAP Discussed Labs  Latest Reference Range & Units 08/17/23 09:31  Hemoglobin 13.0 - 17.7 g/dL 84.1  HCT 62.4 - 48.9 % 48.1   H/H stable He denies tobacco/vape use He wears CPAP 7 nights per week Estimates usage to vary between 4-8 hrs, with most nights 5-6 hrs  2. Vitamin D  deficiency Discussed Labs  Latest Reference Range & Units 08/17/23 09:31  Vitamin D , 25-Hydroxy 30.0 - 100.0 ng/mL 68.8   Vit D Level stable and at goal PCP manages weekly Ergocalciferol   3. Memory impairment 09/03/2023 Neurology OV Notes Mild cognitive impairment of uncertain or unknown etiology   Richard [name] is a very pleasant 78 y.o. RH male with a history of COPD, prediabetes, OSA on  BiPAP, CHF, HOH, B12 deficiency, extensive cardiac history, strong family history of Alzheimer's disease  and a diagnosis of MCI of unclear etiology per neuropsych evaluation June 2025, presenting today in follow-up for evaluation of memory loss.   Once again we discussed the necessity of using hearing aids to improve comprehension. Patient is on donepezil  5 mg daily. Mood is stable.   He states I have to think harder than I used too  4. Prediabetes Discussed Labs  Latest Reference Range & Units 08/17/23 09:31  Hemoglobin A1C 4.8 - 5.6 % 5.8 (H)  Est. average glucose Bld gHb Est-mCnc mg/dL 879  INSULIN  2.6 - 24.9 uIU/mL 4.9  (H): Data is abnormally high  A1c is improved, however still slightly above goal of 5.6 Insulin  level at goal 2023: Started of Farxiga  for HFpEF and improvement in symptoms.  He is currently on daily Farxiga  10mg  each morning He denies breakthrough polyphagia  Assessment/Plan:   1. OSA treated with BiPAP Continue with weight loss efforts and nightly CPAP  2. Vitamin D  deficiency Continue Ergocalciferol  per PCP  3. Memory impairment Per Neurology   Recommendations:    Follow up in  6 months. Continue donepezil  5 mg daily, side effects discussed Recommend good control of cardiovascular risk factors Continue to control mood as per PCP Continue to check hearing, use the hearing aids Used BiPAP for OSA Replenish B12  Increase regular Cardiovascular Exericse  4. Prediabetes Increase cardiovascular exercise Continue  Farxiga  10mg  oer Cards  5.Obesity, Current BMI 35.1  Richard [name] is currently in the action stage of change. As such, his goal is to continue with weight loss efforts. He has agreed to the Category 2 Plan.   Exercise goals: Older adults should follow the adult guidelines. When older adults cannot meet the adult guidelines, they should be as physically active as their abilities and conditions will allow.  Older adults should do exercises that  maintain or improve balance if they are at risk of falling.  Older adults should determine their level of effort for physical activity relative to their level of fitness.  Older adults with chronic conditions should understand whether and how their conditions affect their ability to do regular physical activity safely. Golf 2 x week, [employer] Gym 1 x week (15 mins Treadmill/49mins Rower)  Behavioral modification strategies: increasing lean protein intake, decreasing simple carbohydrates, increasing vegetables, increasing water intake, meal planning and cooking strategies, keeping healthy foods in the home, ways to avoid boredom eating, ways to avoid night time snacking, better snacking choices, and planning for success.  Richard [name] has agreed to follow-up with our clinic in 4 weeks. He was informed of the importance of frequent follow-up visits to maximize his success with intensive lifestyle modifications for his multiple health conditions.   Objective:   Blood pressure 124/66, pulse 71, temperature 98.9 F (37.2 C), height 5' 7 (1.702 m), weight 223 lb (101.2 kg), SpO2 98%. Body mass index is 34.93 kg/m.  General: Cooperative, alert, well developed, in no acute distress. HEENT: Conjunctivae and lids unremarkable. Cardiovascular: Regular rhythm.  Lungs: Normal work of breathing. Neurologic: No focal deficits.   Lab Results  Component Value Date   CREATININE 0.8 07/27/2023   BUN 23 (A) 03/26/2023   NA 133 (A) 07/27/2023   K 4.2 07/27/2023   CL 96 (A) 07/27/2023   CO2 18 07/27/2023   Lab Results  Component Value Date   ALT 17 07/27/2023   AST 29 07/27/2023   ALKPHOS 102 07/27/2023   BILITOT 0.4 09/29/2022   Lab Results  Component Value Date   HGBA1C 5.8 (H) 08/17/2023   HGBA1C 5.9 07/27/2023   HGBA1C 6.2 03/26/2023   HGBA1C 6.1 (H) 09/29/2022   HGBA1C 6.0 (H) 12/09/2020   Lab Results  Component Value Date   INSULIN  4.9 08/17/2023   INSULIN  1.1 (L) 09/29/2022   INSULIN  12.3  07/24/2020   INSULIN  5.3 04/08/2020   Lab Results  Component Value Date   TSH 1.28 02/17/2022   Lab Results  Component Value Date   CHOL 159 07/27/2023   HDL 77 (A) 07/27/2023   LDLCALC 69 07/27/2023   TRIG 53 07/27/2023   CHOLHDL 2.8 07/26/2019   Lab Results  Component Value Date   VD25OH 68.8 08/17/2023   VD25OH 49 03/26/2023   VD25OH 78.7 09/29/2022   Lab Results  Component Value Date   WBC 4.1 08/17/2023   HGB 15.8 08/17/2023   HCT 48.1 08/17/2023   MCV 97 08/17/2023   PLT 187 08/17/2023   No results found for: IRON, TIBC, FERRITIN  Attestation Statements:   Reviewed by clinician on day of visit: allergies, medications, problem list, medical history, surgical history, family history, social history, and previous encounter notes.  I have reviewed the above documentation for accuracy and completeness, and I agree with the above. -  Mahalia Dykes d. Harmonii Karle, NP-C

## 2023-10-12 ENCOUNTER — Ambulatory Visit: Payer: PPO | Admitting: Physician Assistant

## 2023-10-26 ENCOUNTER — Other Ambulatory Visit (HOSPITAL_COMMUNITY): Payer: Self-pay

## 2023-10-28 ENCOUNTER — Other Ambulatory Visit (HOSPITAL_COMMUNITY): Payer: Self-pay

## 2023-10-28 MED ORDER — ALBUTEROL SULFATE (2.5 MG/3ML) 0.083% IN NEBU
2.5000 mg | INHALATION_SOLUTION | Freq: Three times a day (TID) | RESPIRATORY_TRACT | 6 refills | Status: AC | PRN
  Filled 2023-10-28: qty 90, 15d supply, fill #0

## 2023-10-28 MED ORDER — ALBUTEROL SULFATE HFA 108 (90 BASE) MCG/ACT IN AERS
2.0000 | INHALATION_SPRAY | Freq: Every day | RESPIRATORY_TRACT | 3 refills | Status: AC | PRN
  Filled 2023-10-28: qty 6.7, 30d supply, fill #0

## 2023-10-28 MED ORDER — PROMETHAZINE-DM 6.25-15 MG/5ML PO SYRP
5.0000 mL | ORAL_SOLUTION | ORAL | 0 refills | Status: DC | PRN
  Filled 2023-10-28: qty 240, 8d supply, fill #0

## 2023-11-01 ENCOUNTER — Ambulatory Visit

## 2023-11-01 ENCOUNTER — Encounter: Payer: Self-pay | Admitting: Emergency Medicine

## 2023-11-01 ENCOUNTER — Ambulatory Visit: Admission: EM | Admit: 2023-11-01 | Discharge: 2023-11-01 | Disposition: A | Discharge status: Home / Self Care

## 2023-11-01 DIAGNOSIS — R051 Acute cough: Secondary | ICD-10-CM

## 2023-11-01 DIAGNOSIS — I700 Atherosclerosis of aorta: Secondary | ICD-10-CM | POA: Diagnosis not present

## 2023-11-01 DIAGNOSIS — J209 Acute bronchitis, unspecified: Secondary | ICD-10-CM | POA: Diagnosis not present

## 2023-11-01 DIAGNOSIS — R059 Cough, unspecified: Secondary | ICD-10-CM | POA: Diagnosis not present

## 2023-11-01 MED ORDER — AMOXICILLIN-POT CLAVULANATE 875-125 MG PO TABS: 1.0000 | ORAL_TABLET | Freq: Two times a day (BID) | ORAL | 0 refills | Status: AC

## 2023-11-01 NOTE — Discharge Instructions (Addendum)
 You are seen at urgent care today for concerns of a cough.  Your chest x-ray appears to show some increased lung markings consistent with bronchitis.  I would recommend starting Augmentin  for this and finish her course of doxycycline .  Please follow-up with your primary care provider for further evaluation.  For any concerns of increased difficulty breathing, go to the emergency department for further evaluation.

## 2023-11-01 NOTE — ED Triage Notes (Signed)
 Pt here for ongoing cough that started around 10/23/23. Was seen at a different urgent care 8/16 and diagnosed with bronchitis. Has been taking promethazine -dextromethorphan  cough syrup and has 1 remaining dose of doxycycline  tonight. Symptoms are getting better, but still has strong cough. Pt notes past respiratory complications and wants to stay ahead of this.

## 2023-11-01 NOTE — ED Provider Notes (Signed)
 [facility]    CSN: [id number] Arrival date & time: 11/01/23  1529      History   Chief Complaint Chief Complaint  Patient presents with   Cough    HPI Richard [name] is a 78 y.o. male.  Patient with past history significant for CHF, COPD, asthma presents to urgent care with concerns of a cough.  Symptoms have been ongoing for over 10 days and was initially seen at urgent care on 10/23/2023 and started on a course of doxycycline  and cough medicine.  Reports improvement of his cough but states cough is ongoing.  He does describe the cough is productive but denies any discoloration of his phlegm.  No report hemoptysis.  Denies any fever, chills, or bodyaches.  No increased feeling of shortness of breath with exertion or at rest.   Cough   Past Medical History:  Diagnosis Date   Acid reflux disease    Arthritis    Asthma 04/30/2013   Atherosclerosis of coronary artery 04/30/2013   Ventricular fibrillation; circumflex stent x2 in 2004   Benign essential hypertension    CAP (community acquired pneumonia) 04/30/2013   Chronic heart failure with preserved ejection fraction (HFpEF) 04/17/2022   Class 2 severe obesity with serious comorbidity and body mass index (BMI) of 39.0 to 39.9 in adult 06/04/2020   COPD, 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   Dyspnea on exertion 09/13/2017   Edema, lower extremity    Essential hypertension 12/02/2021   Excessive cerumen in both ear canals 01/05/2022   Fatigue 01/06/2022   Heart failure, diastolic    Hemorrhage of rectum and anus    History of colonic polyps    Hyperlipidemia 06/26/2013   Hypoxia 04/30/2013   Influenza due to identified novel influenza A virus with other respiratory manifestations 05/01/2013   Internal hemorrhoids    Mild cognitive impairment of uncertain or unknown etiology 09/30/2021   Myocardial infarction 2004   Dr. Claudene, at [place], yearly visits   OSA treated with BiPAP 04/24/2018   Mild OSA with an AHI of 13.5/hr  and O2 sats of 88%. Now on BiPAP at 13/9cm H2O.    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   Prediabetes 04/09/2020   Pure hypercholesterolemia    Right bundle branch block 06/27/2014    EKG performed 06/27/2014   Transient total loss of muscle tone 01/06/2022   Vitamin D  deficiency     Patient Active Problem List   Diagnosis Date Noted   Chronic heart failure with preserved ejection fraction (HFpEF) 04/17/2022   SOBOE (shortness of breath on exertion) 01/06/2022   Fatigue 01/06/2022   Transient total loss of muscle tone 01/06/2022   Essential hypertension 12/02/2021   Mild cognitive impairment of uncertain or unknown etiology 09/30/2021   Acid reflux disease 09/29/2021   Heart failure, diastolic 09/29/2021   Benign essential hypertension    Internal hemorrhoids    History of colonic polyps    Pure hypercholesterolemia    Class 2 severe obesity with serious comorbidity and body mass index (BMI) of 39.0 to 39.9 in adult 06/04/2020   COPD, 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   Prediabetes 04/09/2020   OSA treated with BiPAP 04/24/2018   Right bundle branch block 06/27/2014   Hyperlipidemia 06/26/2013   Atherosclerosis of coronary artery 04/30/2013   Asthma 04/30/2013   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    Class: Chronic   Myocardial infarction 2004    Past Surgical History:  Procedure Laterality Date   CARDIAC CATHETERIZATION  2004   with 2  stents   CARDIOVASCULAR STRESS TEST  2011   COLONOSCOPY     POLYPECTOMY  02/17/2011   Procedure: POLYPECTOMY NASAL;  Surgeon: Alm LITTIE Bouche;  Location: [facility] OR;  Service: ENT;  Laterality: N/A;   SINUS ENDO W/FUSION  02/17/2011   Procedure: ENDOSCOPIC SINUS SURGERY WITH FUSION NAVIGATION;  Surgeon: Alm LITTIE Bouche;  Location: [facility] OR;  Service: ENT;  Laterality: N/A;   TONSILLECTOMY     US  ECHOCARDIOGRAPHY         Home Medications    Prior to Admission medications   Medication Sig Start Date End Date Taking? Authorizing Provider   albuterol  (PROVENTIL ) (2.5 MG/3ML) 0.083% nebulizer solution Take 3 mLs (2.5 mg total) by nebulization 2 times daily and at bedtime as needed. 10/28/23  Yes   albuterol  (VENTOLIN  HFA) 108 (90 Base) MCG/ACT inhaler Inhale 2 puffs into the lungs daily as needed. 10/28/23  Yes   amLODipine  (NORVASC ) 5 MG tablet Take 1 tablet (5 mg total) by mouth daily. 01/04/23  Yes   amoxicillin -clavulanate (AUGMENTIN ) 875-125 MG tablet Take 1 tablet by mouth every 12 (twelve) hours for 5 days. 11/01/23 11/06/23 Yes Carsen Machi A, PA-C  aspirin  EC 81 MG tablet Take 1 tablet (81 mg total) by mouth daily. 04/22/17  Yes Hammond, Janine, NP  atorvastatin  (LIPITOR) 80 MG tablet Take 1 tablet (80 mg total) by mouth at bedtime. 03/15/23  Yes   carvedilol  (COREG ) 12.5 MG tablet Take 1 tablet (12.5 mg total) by mouth 2 (two) times daily. 08/24/23  Yes Turner, Wilbert SAUNDERS, MD  chlorthalidone  (HYGROTON ) 25 MG tablet Take 1/2 tablet (12.5 mg total) by mouth daily. 07/19/23  Yes Turner, Wilbert SAUNDERS, MD  Cyanocobalamin  (VITAMIN B 12 PO) Take by mouth.   Yes [provider]  dapagliflozin  propanediol (FARXIGA ) 10 MG TABS tablet Take 1 tablet (10 mg total) by mouth daily before breakfast. 03/16/23  Yes Turner, Wilbert SAUNDERS, MD  donepezil  (ARICEPT ) 5 MG tablet Take 1 tablet (5 mg total) by mouth daily. 09/03/23  Yes Wertman, Sara E, PA-C  doxycycline  (VIBRAMYCIN ) 100 MG capsule Take 100 mg by mouth 2 (two) times daily. 10/23/23  Yes [provider]  ezetimibe  (ZETIA ) 10 MG tablet Take 1 tablet (10 mg total) by mouth daily. 02/09/23  Yes   fluticasone  furoate-vilanterol (BREO ELLIPTA ) 200-25 MCG/ACT AEPB Inhale 1 puff into the lungs daily. 09/16/23  Yes   guaiFENesin  (MUCINEX ) 600 MG 12 hr tablet Take by mouth 2 (two) times daily.   Yes [provider]  montelukast  (SINGULAIR ) 10 MG tablet Take 1 tablet by mouth daily 09/23/23  Yes   polyethylene glycol powder (GLYCOLAX/MIRALAX) 17 GM/SCOOP powder See admin instructions.   Yes  [provider]  potassium chloride  (MICRO-K ) 10 MEQ CR capsule Take 1 capsule (10 mEq total) by mouth daily. 03/15/23  Yes   promethazine -dextromethorphan  (PROMETHAZINE -DM) 6.25-15 MG/5ML syrup Take 5 mLs by mouth every 4-6 hours as needed for cough. 10/28/23  Yes   tamsulosin  (FLOMAX ) 0.4 MG CAPS capsule Take 1 capsule (0.4 mg total) by mouth daily. 03/23/23  Yes   Vitamin D , Ergocalciferol , 50000 units CAPS Take 1 capsule by mouth weekly 09/21/22  Yes   bisacodyl  5 MG EC tablet use as directed Patient not taking: Reported on 11/01/2023 11/26/22     COVID-19 mRNA Vac-TriS, Pfizer, (PFIZER-BIONT COVID-19 VAC-TRIS) SUSP injection Inject into the muscle. Patient not taking: Reported on 11/01/2023 09/24/20   Luiz Channel, MD  ipratropium-albuterol  (DUONEB) 0.5-2.5 (3) MG/3ML SOLN Inhale 3  mLs by nebulization 2 (two) times daily and once at bedtime as needed. Patient not taking: Reported on 11/01/2023 02/01/23   Hillman Bare, MD  nitroGLYCERIN  (NITROSTAT ) 0.4 MG SL tablet Place 1 tablet (0.4 mg total) under the tongue every 5 (five) minutes as needed. For chest pain 04/27/22   Shlomo Wilbert SAUNDERS, MD  omeprazole  (PRILOSEC) 40 MG capsule Take 1 capsule (40 mg total) by mouth 2 (two) times daily. Patient not taking: Reported on 11/01/2023 07/19/23     polyethylene glycol-electrolytes (NULYTELY) 420 g solution Use as directed Patient not taking: Reported on 11/01/2023 03/21/21       Family History Family History  Problem Relation Age of Onset   Cancer Mother    Alzheimer's disease Mother    Stroke Mother    Heart attack Father    Diabetes Father    Hypertension Father    Hypertension Sister    Diabetes Sister    Alzheimer's disease Maternal Aunt        total of 4 maternal aunts with AD   Alzheimer's disease Cousin    Alzheimer's disease Maternal Uncle     Social History Social History   Tobacco Use   Smoking status: Former   Smokeless tobacco: Never  Advertising account planner   Vaping status:  Never Used  Substance Use Topics   Alcohol use: Yes    Comment: infrequent   Drug use: No     Allergies   Beef-derived drug products, Chicken protein, Lisinopril, and Peanut-containing drug products   Review of Systems Review of Systems  Respiratory:  Positive for cough.   All other systems reviewed and are negative.    Physical Exam Triage Vital Signs ED Triage Vitals  Encounter Vitals Group     BP 11/01/23 1607 (!) 151/80     Girls Systolic BP Percentile --      Girls Diastolic BP Percentile --      Boys Systolic BP Percentile --      Boys Diastolic BP Percentile --      Pulse Rate 11/01/23 1607 82     Resp 11/01/23 1607 18     Temp 11/01/23 1607 98.6 F (37 C)     Temp Source 11/01/23 1607 Oral     SpO2 11/01/23 1607 94 %     Weight --      Height --      Head Circumference --      Peak Flow --      Pain Score 11/01/23 1608 0     Pain Loc --      Pain Education --      Exclude from Growth Chart --    No data found.  Updated Vital Signs BP 131/78 (BP Location: Left Arm)   Pulse 82   Temp 98.6 F (37 C) (Oral)   Resp 18   SpO2 94%   Visual Acuity Right Eye Distance:   Left Eye Distance:   Bilateral Distance:    Right Eye Near:   Left Eye Near:    Bilateral Near:     Physical Exam Vitals and nursing note reviewed.  Constitutional:      General: He is not in acute distress.    Appearance: He is well-developed.  HENT:     Head: Normocephalic and atraumatic.  Eyes:     Conjunctiva/sclera: Conjunctivae normal.  Cardiovascular:     Rate and Rhythm: Normal rate and regular rhythm.     Heart sounds: No murmur heard. Pulmonary:  Effort: Pulmonary effort is normal. No respiratory distress.     Breath sounds: Wheezing, rhonchi and rales present.     Comments: Wheezing heard throughout all lung fields along with rhonchorous lung sounds, rales heard to bilateral lung bases. Abdominal:     Palpations: Abdomen is soft.     Tenderness: There is no  abdominal tenderness.  Musculoskeletal:        General: No swelling.     Cervical back: Neck supple.  Skin:    General: Skin is warm and dry.     Capillary Refill: Capillary refill takes less than 2 seconds.  Neurological:     Mental Status: He is alert.  Psychiatric:        Mood and Affect: Mood normal.      UC Treatments / Results  Labs (all labs ordered are listed, but only abnormal results are displayed) Labs Reviewed - No data to display  EKG   Radiology DG Chest 2 View Result Date: 11/01/2023 CLINICAL DATA:  Cough EXAM: CHEST - 2 VIEW COMPARISON:  March 13 2022 FINDINGS: Cardiomediastinal silhouette is normal. Aortic knob is calcified. There is increased interstitial marking of both lung fields predominantly in lower lobes. No pleural effusion. No acute osseous abnormality. IMPRESSION: Increased interstitial markings of both lung fields. Recommend chest CT for further assessment if clinically warranted. Electronically Signed   By: Megan  Zare M.D.   On: 11/01/2023 17:00    Procedures Procedures (including critical care time)  Medications Ordered in UC Medications - No data to display  Initial Impression / Assessment and Plan / UC Course  I have reviewed the triage vital signs and the nursing notes.  Pertinent labs & imaging results that were available during my care of the patient were reviewed by me and considered in my medical decision making (see chart for details).   This patient presents to the UC for concern of cough.  Differential diagnosis includes bronchitis, pneumonia, viral URI, postviral cough syndrome   Imaging Studies ordered:  I ordered imaging studies including chest x-ray I independently visualized and interpreted imaging which showed increased interstitial markings in bilateral lung fields I agree with the radiologist interpretation   Problem List / UC Course:  Patient with past history significant for asthma, COPD, and CHF presents to the  ED with concerns of a cough.  Reports symptoms ongoing for the last 10 days or more.  States he was seen at urgent care about 10 days ago and prescribed doxycycline  which he has taken in almost its entirety.  Reports improvement in his cough but denies full resolution.  No reported fever, chills, body aches.  States cough is productive but has no discolored phlegm. Physical exam reveals diffuse wheezing and rhonchorous lung sounds as well as rales to bilateral lung bases.  The patient COPD and asthma history, this is nondisabling atypical.  Vitals unremarkable with no signs of hypoxia, tachypnea, or obvious signs of respiratory distress.  Will proceed with x-ray for further evaluation of any possible signs of pneumonia versus other source. Chest x-ray negative for any obvious fluid consolidation such as effusion or pneumonia.  There is some increased interstitial lung markings which I believe is likely secondary to patient's bronchitis with symptoms ongoing but improving over the last several days.  Will start patient on a course of Augmentin  to broaden coverage.  Advised continuation of promethazine -dextromethorphan  for cough as needed.  Return precautions discussed as well as ED precautions such as development of shortness of breath  or fatigue or weakness.  Patient was stable for outpatient follow-up and discharged home at this time.   Social Determinants of Health:  None  Final Clinical Impressions(s) / UC Diagnoses   Final diagnoses:  Acute bronchitis, unspecified organism     Discharge Instructions      You are seen at urgent care today for concerns of a cough.  Your chest x-ray appears to show some increased lung markings consistent with bronchitis.  I would recommend starting Augmentin  for this and finish her course of doxycycline .  Please follow-up with your primary care provider for further evaluation.  For any concerns of increased difficulty breathing, go to the emergency department for  further evaluation.     ED Prescriptions     Medication Sig Dispense Auth. Provider   amoxicillin -clavulanate (AUGMENTIN ) 875-125 MG tablet Take 1 tablet by mouth every 12 (twelve) hours for 5 days. 10 tablet Rohn Fritsch A, PA-C      PDMP not reviewed this encounter.   Verenise Moulin A, PA-C 11/01/23 [phone]

## 2023-11-02 ENCOUNTER — Ambulatory Visit (INDEPENDENT_AMBULATORY_CARE_PROVIDER_SITE_OTHER): Admitting: Adult Health

## 2023-11-02 ENCOUNTER — Encounter (INDEPENDENT_AMBULATORY_CARE_PROVIDER_SITE_OTHER): Payer: Self-pay | Admitting: Adult Health

## 2023-11-02 VITALS — BP 147/74 | HR 80 | Temp 98.8°F | Ht 67.0 in | Wt 221.0 lb

## 2023-11-02 DIAGNOSIS — G4733 Obstructive sleep apnea (adult) (pediatric): Secondary | ICD-10-CM

## 2023-11-02 DIAGNOSIS — R7303 Prediabetes: Secondary | ICD-10-CM | POA: Diagnosis not present

## 2023-11-02 DIAGNOSIS — E669 Obesity, unspecified: Secondary | ICD-10-CM

## 2023-11-02 DIAGNOSIS — Z6834 Body mass index (BMI) 34.0-34.9, adult: Secondary | ICD-10-CM

## 2023-11-02 DIAGNOSIS — E559 Vitamin D deficiency, unspecified: Secondary | ICD-10-CM

## 2023-11-02 DIAGNOSIS — J40 Bronchitis, not specified as acute or chronic: Secondary | ICD-10-CM | POA: Diagnosis not present

## 2023-11-02 DIAGNOSIS — E66812 Obesity, class 2: Secondary | ICD-10-CM

## 2023-11-02 NOTE — Progress Notes (Signed)
 WEIGHT SUMMARY AND BIOMETRICS  Vitals Temp: 98.8 F (37.1 C) BP: (!) 147/74 Pulse Rate: 80 SpO2: 98 %   Anthropometric Measurements Height: 5' 7 (1.702 m) Weight: 221 lb (100.2 kg) BMI (Calculated): 34.61 Weight at Last Visit: 223 lb Weight Lost Since Last Visit: 2 lb Weight Gained Since Last Visit: 0 Starting Weight: 258 lb Total Weight Loss (lbs): 37 lb (16.8 kg)   Body Composition  Body Fat %: 33.1 % Fat Mass (lbs): 73.4 lbs Muscle Mass (lbs): 140.8 lbs Total Body Water (lbs): 97 lbs Visceral Fat Rating : 22   Other Clinical Data Fasting: no Labs: no Today's Visit #: 39 Starting Date: 04/08/20    Chief Complaint:   OBESITY Richard [name] is here to discuss his progress with his obesity treatment plan.  He is on the the Category 2 Plan and states he is following his eating plan approximately 60 % of the time.  He states he is exercising: NOne- acutely ill.   Interim History:  He and his family recently travelled to [place]  for 15 days. Half way through the trip, Richard [name] developed URI sx's and seen/treated at local UC- started on course of Doxycycline  and completed yesterday. He was seen at [facility] yesterday- he was given additional course of ABX and cough syrup  He endorses fatigue, cough He denies worsening dyspnea, fever, or GI upset He denies any other family members acutely ill  Subjective:   1. Bronchitis 11/01/2023 HPI Richard [name] is a 78 y.o. male.  Patient with past history significant for CHF, COPD, asthma presents to urgent care with concerns of a cough.  Symptoms have been ongoing for over 10 days and was initially seen at urgent care on 10/23/2023 and started on a course of doxycycline  and cough medicine.  Reports improvement of his cough but states cough is ongoing.  He does describe the cough is productive but denies any discoloration of his phlegm.  No report hemoptysis.  Denies any fever, chills, or bodyaches.  No increased  feeling of shortness of breath with exertion or at rest. Cough 11/01/2023 CLINICAL DATA:  Cough   EXAM: CHEST - 2 VIEW   COMPARISON:  March 13 2022   FINDINGS: Cardiomediastinal silhouette is normal. Aortic knob is calcified. There is increased interstitial marking of both lung fields predominantly in lower lobes. No pleural effusion. No acute osseous abnormality.   IMPRESSION: Increased interstitial markings of both lung fields. Recommend chest CT for further assessment if clinically warranted.   OF NOTE- He has hx of COPD and HFpEF  2. Vitamin D  deficiency  Latest Reference Range & Units 09/29/22 14:10 03/26/23 08:36 08/17/23 09:31  Vitamin D , 25-Hydroxy 30.0 - 100.0 ng/mL 78.7 49 (E) 68.8  (E): External lab result  PCP manges weekly Ergocalciferol  He endorses recent fatigue r/t acute Bronchitis  3. Prediabetes Lab Results  Component Value Date   HGBA1C 5.8 (H) 08/17/2023   HGBA1C 5.9 07/27/2023   HGBA1C 6.2 03/26/2023   2023: Started of Farxiga  for HFpEF and improvement in symptoms.  He is currently on daily Farxiga  10mg  each morning  Assessment/Plan:   1. Bronchitis Complete second course of ABX If sx's persist f/u with PCP or UC Repeat CXR as directed  2. Vitamin D  deficiency Continue weekly Ergocalciferol   3. Prediabetes Continue healthy eating and daily SGLT2 therapy per Cards  4. Obesity, Current BMI 34.7  Richard [name] is currently in the action stage of change. As such, his goal is to  continue with weight loss efforts. He has agreed to the Category 2 Plan.   Exercise goals: Older adults should follow the adult guidelines. When older adults cannot meet the adult guidelines, they should be as physically active as their abilities and conditions will allow.  Older adults should do exercises that maintain or improve balance if they are at risk of falling.  Older adults should determine their level of effort for physical activity relative to their level of  fitness.  Older adults with chronic conditions should understand whether and how their conditions affect their ability to do regular physical activity safely.  Behavioral modification strategies: increasing lean protein intake, decreasing simple carbohydrates, increasing vegetables, increasing water intake, no skipping meals, meal planning and cooking strategies, keeping healthy foods in the home, ways to avoid boredom eating, and planning for success.  Richard [name] has agreed to follow-up with our clinic in 4 weeks. He was informed of the importance of frequent follow-up visits to maximize his success with intensive lifestyle modifications for his multiple health conditions.   Objective:   Blood pressure (!) 147/74, pulse 80, temperature 98.8 F (37.1 C), height 5' 7 (1.702 m), weight 221 lb (100.2 kg), SpO2 98%. Body mass index is 34.61 kg/m.  General: Cooperative, alert, well developed, in no acute distress. HEENT: Conjunctivae and lids unremarkable. Cardiovascular: Regular rhythm.  Lungs: Normal work of breathing. Neurologic: No focal deficits.   Lab Results  Component Value Date   CREATININE 0.8 07/27/2023   BUN 23 (A) 03/26/2023   NA 133 (A) 07/27/2023   K 4.2 07/27/2023   CL 96 (A) 07/27/2023   CO2 18 07/27/2023   Lab Results  Component Value Date   ALT 17 07/27/2023   AST 29 07/27/2023   ALKPHOS 102 07/27/2023   BILITOT 0.4 09/29/2022   Lab Results  Component Value Date   HGBA1C 5.8 (H) 08/17/2023   HGBA1C 5.9 07/27/2023   HGBA1C 6.2 03/26/2023   HGBA1C 6.1 (H) 09/29/2022   HGBA1C 6.0 (H) 12/09/2020   Lab Results  Component Value Date   INSULIN  4.9 08/17/2023   INSULIN  1.1 (L) 09/29/2022   INSULIN  12.3 07/24/2020   INSULIN  5.3 04/08/2020   Lab Results  Component Value Date   TSH 1.28 02/17/2022   Lab Results  Component Value Date   CHOL 159 07/27/2023   HDL 77 (A) 07/27/2023   LDLCALC 69 07/27/2023   TRIG 53 07/27/2023   CHOLHDL 2.8 07/26/2019   Lab  Results  Component Value Date   VD25OH 68.8 08/17/2023   VD25OH 49 03/26/2023   VD25OH 78.7 09/29/2022   Lab Results  Component Value Date   WBC 4.1 08/17/2023   HGB 15.8 08/17/2023   HCT 48.1 08/17/2023   MCV 97 08/17/2023   PLT 187 08/17/2023   No results found for: IRON, TIBC, FERRITIN  Attestation Statements:   Reviewed by clinician on day of visit: allergies, medications, problem list, medical history, surgical history, family history, social history, and previous encounter notes.  I have reviewed the above documentation for accuracy and completeness, and I agree with the above. -  Krew Hortman d. Dandford, NP-C

## 2023-11-04 DIAGNOSIS — G4733 Obstructive sleep apnea (adult) (pediatric): Secondary | ICD-10-CM | POA: Diagnosis not present

## 2023-11-13 ENCOUNTER — Encounter: Payer: Self-pay | Admitting: Emergency Medicine

## 2023-11-13 ENCOUNTER — Ambulatory Visit
Admission: EM | Admit: 2023-11-13 | Discharge: 2023-11-13 | Disposition: A | Discharge status: Home / Self Care | Attending: Emergency Medicine | Admitting: Emergency Medicine

## 2023-11-13 DIAGNOSIS — L509 Urticaria, unspecified: Secondary | ICD-10-CM | POA: Diagnosis not present

## 2023-11-13 MED ORDER — CETIRIZINE HCL 5 MG PO TABS: 5.0000 mg | ORAL_TABLET | Freq: Every day | ORAL | 0 refills | Status: AC

## 2023-11-13 MED ORDER — METHYLPREDNISOLONE SODIUM SUCC 125 MG IJ SOLR
60.0000 mg | Freq: Once | INTRAMUSCULAR | Status: AC
  Administered 2023-11-13: 60 mg via INTRAMUSCULAR

## 2023-11-13 NOTE — ED Triage Notes (Signed)
 Pt presents c/o allergic reaction x 3 days. Pt reports he was just home watching TV and starting getting hives in several places on his. Pt denies contact with any known allergens. Pt says he did not eat or participate ain any different or new activities prior to the breakout.

## 2023-11-13 NOTE — ED Provider Notes (Signed)
 [facility]    CSN: [id number] Arrival date & time: 11/13/23  1202     History   Chief Complaint Chief Complaint  Patient presents with   Allergic Reaction    HPI Richard [name] is a 78 y.o. male.  3 days ago started having hives on parts of the body Itching, not painful Started when he was sitting on the couch watching TV No known exposure to allergens. No new medications or foods No oral or respiratory symptoms  Took benadryl  at home last 2 nights.   [place] new sheets and products there.   He finished course of augmentin  1 week ago Prediabetic. Last A1c was 5.8  Past Medical History:  Diagnosis Date   Acid reflux disease    Arthritis    Asthma 04/30/2013   Atherosclerosis of coronary artery 04/30/2013   Ventricular fibrillation; circumflex stent x2 in 2004   Benign essential hypertension    CAP (community acquired pneumonia) 04/30/2013   Chronic heart failure with preserved ejection fraction (HFpEF) 04/17/2022   Class 2 severe obesity with serious comorbidity and body mass index (BMI) of 39.0 to 39.9 in adult 06/04/2020   COPD, 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   Dyspnea on exertion 09/13/2017   Edema, lower extremity    Essential hypertension 12/02/2021   Excessive cerumen in both ear canals 01/05/2022   Fatigue 01/06/2022   Heart failure, diastolic    Hemorrhage of rectum and anus    History of colonic polyps    Hyperlipidemia 06/26/2013   Hypoxia 04/30/2013   Influenza due to identified novel influenza A virus with other respiratory manifestations 05/01/2013   Internal hemorrhoids    Mild cognitive impairment of uncertain or unknown etiology 09/30/2021   Myocardial infarction 2004   Dr. Claudene, at [facility], yearly visits   OSA treated with BiPAP 04/24/2018   Mild OSA with an AHI of 13.5/hr and O2 sats of 88%. Now on BiPAP at 13/9cm H2O.    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   Prediabetes 04/09/2020   Pure  hypercholesterolemia    Right bundle branch block 06/27/2014    EKG performed 06/27/2014   Transient total loss of muscle tone 01/06/2022   Vitamin D  deficiency     Patient Active Problem List   Diagnosis Date Noted   Chronic heart failure with preserved ejection fraction (HFpEF) 04/17/2022   SOBOE (shortness of breath on exertion) 01/06/2022   Fatigue 01/06/2022   Transient total loss of muscle tone 01/06/2022   Essential hypertension 12/02/2021   Mild cognitive impairment of uncertain or unknown etiology 09/30/2021   Acid reflux disease 09/29/2021   Heart failure, diastolic 09/29/2021   Benign essential hypertension    Internal hemorrhoids    History of colonic polyps    Pure hypercholesterolemia    Class 2 severe obesity with serious comorbidity and body mass index (BMI) of 39.0 to 39.9 in adult 06/04/2020   COPD, 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   Prediabetes 04/09/2020   OSA treated with BiPAP 04/24/2018   Right bundle branch block 06/27/2014   Hyperlipidemia 06/26/2013   Atherosclerosis of coronary artery 04/30/2013   Asthma 04/30/2013   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    Class: Chronic   Myocardial infarction 2004    Past Surgical History:  Procedure Laterality Date   CARDIAC CATHETERIZATION  2004   with 2 stents   CARDIOVASCULAR STRESS TEST  2011   COLONOSCOPY     POLYPECTOMY  02/17/2011   Procedure: POLYPECTOMY  NASAL;  Surgeon: Alm LITTIE Bouche;  Location: [facility] OR;  Service: ENT;  Laterality: N/A;   SINUS ENDO W/FUSION  02/17/2011   Procedure: ENDOSCOPIC SINUS SURGERY WITH FUSION NAVIGATION;  Surgeon: Alm LITTIE Bouche;  Location: [facility] OR;  Service: ENT;  Laterality: N/A;   TONSILLECTOMY     US  ECHOCARDIOGRAPHY         Home Medications    Prior to Admission medications   Medication Sig Start Date End Date Taking? Authorizing Provider  cetirizine  (ZYRTEC ) 5 MG tablet Take 1 tablet (5 mg total) by mouth daily. 11/13/23  Yes Kathleene Bergemann, [place]berry, PA-C  albuterol   (PROVENTIL ) (2.5 MG/3ML) 0.083% nebulizer solution Take 3 mLs (2.5 mg total) by nebulization 2 times daily and at bedtime as needed. 10/28/23     albuterol  (VENTOLIN  HFA) 108 (90 Base) MCG/ACT inhaler Inhale 2 puffs into the lungs daily as needed. 10/28/23     amLODipine  (NORVASC ) 5 MG tablet Take 1 tablet (5 mg total) by mouth daily. 01/04/23     aspirin  EC 81 MG tablet Take 1 tablet (81 mg total) by mouth daily. 04/22/17   Hammond, Janine, NP  atorvastatin  (LIPITOR) 80 MG tablet Take 1 tablet (80 mg total) by mouth at bedtime. 03/15/23     bisacodyl  5 MG EC tablet use as directed Patient not taking: Reported on 11/02/2023 11/26/22     carvedilol  (COREG ) 12.5 MG tablet Take 1 tablet (12.5 mg total) by mouth 2 (two) times daily. 08/24/23   Shlomo Wilbert SAUNDERS, MD  chlorthalidone  (HYGROTON ) 25 MG tablet Take 1/2 tablet (12.5 mg total) by mouth daily. 07/19/23   Shlomo Wilbert SAUNDERS, MD  COVID-19 mRNA Vac-TriS, Pfizer, (PFIZER-BIONT COVID-19 VAC-TRIS) SUSP injection Inject into the muscle. 09/24/20   Luiz Channel, MD  Cyanocobalamin  (VITAMIN B 12 PO) Take by mouth.    [provider]  dapagliflozin  propanediol (FARXIGA ) 10 MG TABS tablet Take 1 tablet (10 mg total) by mouth daily before breakfast. 03/16/23   Turner, Wilbert SAUNDERS, MD  donepezil  (ARICEPT ) 5 MG tablet Take 1 tablet (5 mg total) by mouth daily. 09/03/23   Wertman, Sara E, PA-C  doxycycline  (VIBRAMYCIN ) 100 MG capsule Take 100 mg by mouth 2 (two) times daily. 10/23/23   [provider]  ezetimibe  (ZETIA ) 10 MG tablet Take 1 tablet (10 mg total) by mouth daily. 02/09/23     fluticasone  furoate-vilanterol (BREO ELLIPTA ) 200-25 MCG/ACT AEPB Inhale 1 puff into the lungs daily. 09/16/23     guaiFENesin  (MUCINEX ) 600 MG 12 hr tablet Take by mouth 2 (two) times daily.    [provider]  ipratropium-albuterol  (DUONEB) 0.5-2.5 (3) MG/3ML SOLN Inhale 3 mLs by nebulization 2 (two) times daily and once at bedtime as needed. Patient not taking:  Reported on 11/02/2023 02/01/23   Hillman Bare, MD  montelukast  (SINGULAIR ) 10 MG tablet Take 1 tablet by mouth daily 09/23/23     nitroGLYCERIN  (NITROSTAT ) 0.4 MG SL tablet Place 1 tablet (0.4 mg total) under the tongue every 5 (five) minutes as needed. For chest pain 04/27/22   Shlomo Wilbert SAUNDERS, MD  omeprazole  (PRILOSEC) 40 MG capsule Take 1 capsule (40 mg total) by mouth 2 (two) times daily. 07/19/23     polyethylene glycol powder (GLYCOLAX/MIRALAX) 17 GM/SCOOP powder See admin instructions.    [provider]  polyethylene glycol-electrolytes (NULYTELY) 420 g solution Use as directed 03/21/21     potassium chloride  (MICRO-K ) 10 MEQ CR capsule Take 1 capsule (10 mEq total) by mouth daily. 03/15/23  promethazine -dextromethorphan  (PROMETHAZINE -DM) 6.25-15 MG/5ML syrup Take 5 mLs by mouth every 4-6 hours as needed for cough. 10/28/23     tamsulosin  (FLOMAX ) 0.4 MG CAPS capsule Take 1 capsule (0.4 mg total) by mouth daily. 03/23/23     Vitamin D , Ergocalciferol , 50000 units CAPS Take 1 capsule by mouth weekly 09/21/22       Family History Family History  Problem Relation Age of Onset   Cancer Mother    Alzheimer's disease Mother    Stroke Mother    Heart attack Father    Diabetes Father    Hypertension Father    Hypertension Sister    Diabetes Sister    Alzheimer's disease Maternal Aunt        total of 4 maternal aunts with AD   Alzheimer's disease Cousin    Alzheimer's disease Maternal Uncle     Social History Social History   Tobacco Use   Smoking status: Former    Passive exposure: Never   Smokeless tobacco: Never  Vaping Use   Vaping status: Never Used  Substance Use Topics   Alcohol use: Yes    Comment: infrequent   Drug use: No     Allergies   Beef-derived drug products, Chicken protein, Lisinopril, and Peanut-containing drug products   Review of Systems Review of Systems  As per HPI  Physical Exam Triage Vital Signs ED Triage Vitals  Encounter  Vitals Group     BP 11/13/23 1333 139/80     Girls Systolic BP Percentile --      Girls Diastolic BP Percentile --      Boys Systolic BP Percentile --      Boys Diastolic BP Percentile --      Pulse Rate 11/13/23 1333 73     Resp 11/13/23 1333 18     Temp 11/13/23 1333 98.7 F (37.1 C)     Temp Source 11/13/23 1333 Oral     SpO2 11/13/23 1333 97 %     Weight 11/13/23 1332 220 lb 14.4 oz (100.2 kg)     Height --      Head Circumference --      Peak Flow --      Pain Score 11/13/23 1332 0     Pain Loc --      Pain Education --      Exclude from Growth Chart --    No data found.  Updated Vital Signs BP 139/80 (BP Location: Left Arm)   Pulse 73   Temp 98.7 F (37.1 C) (Oral)   Resp 18   Wt 220 lb 14.4 oz (100.2 kg)   SpO2 97%   BMI 34.60 kg/m    Physical Exam Vitals and nursing note reviewed.  Constitutional:      General: He is not in acute distress.    Appearance: Normal appearance.  HENT:     Mouth/Throat:     Mouth: Mucous membranes are moist.     Pharynx: Oropharynx is clear. No posterior oropharyngeal erythema.  Eyes:     Conjunctiva/sclera: Conjunctivae normal.     Pupils: Pupils are equal, round, and reactive to light.  Cardiovascular:     Rate and Rhythm: Normal rate and regular rhythm.     Pulses: Normal pulses.     Heart sounds: Normal heart sounds.  Pulmonary:     Effort: Pulmonary effort is normal. No respiratory distress.     Breath sounds: Normal breath sounds. No wheezing.  Abdominal:     General:  Bowel sounds are normal.     Tenderness: There is no abdominal tenderness.  Musculoskeletal:        General: Normal range of motion.     Cervical back: Normal range of motion.  Skin:    Findings: Rash present. Rash is urticarial.     Comments: A few areas of the skin with scattered wheals, consistent with urticaria. Left arm, left abdomen, a few on back, right leg   Neurological:     Mental Status: He is alert and oriented to person, place, and  time.     UC Treatments / Results  Labs (all labs ordered are listed, but only abnormal results are displayed) Labs Reviewed - No data to display  EKG   Radiology No results found.  Procedures Procedures (including critical care time)  Medications Ordered in UC Medications  methylPREDNISolone  sodium succinate (SOLU-MEDROL ) 125 mg/2 mL injection 60 mg (60 mg Intramuscular Given 11/13/23 1433)    Initial Impression / Assessment and Plan / UC Course  I have reviewed the triage vital signs and the nursing notes.  Pertinent labs & imaging results that were available during my care of the patient were reviewed by me and considered in my medical decision making (see chart for details).  Hives/urticaria Offered IM steroid in clinic for acute itch relief. At home antihistamine regimen discussed. Will use half dose zyrtec  (5 mg), starting at bedtime. He has been using benadryl  without getting drowsy, can continue this with precautions. Also can use pepcid for H2 blocker effect. Advised reasons to return to clinic, ED precautions. Agrees to plan, no questions   Final Clinical Impressions(s) / UC Diagnoses   Final diagnoses:  Hives     Discharge Instructions      The steroid injection can start to work in about 30 minutes to reduce itching, and the hives should start to go down in a few days.  At home please use the following antihistamine regimen: Daily (or nightly if it makes you drowsy) zyrtec  5 mg Nighty pepcid 20 mg and benadryl  12.5 mg Use for the next 5 or so days in a row to keep itch away      ED Prescriptions     Medication Sig Dispense Auth. Provider   cetirizine  (ZYRTEC ) 5 MG tablet Take 1 tablet (5 mg total) by mouth daily. 30 tablet Jayvien Rowlette, Asberry, PA-C      PDMP not reviewed this encounter.   Jeryl Asberry, PA-C 11/13/23 1638

## 2023-11-13 NOTE — Discharge Instructions (Addendum)
 The steroid injection can start to work in about 30 minutes to reduce itching, and the hives should start to go down in a few days.  At home please use the following antihistamine regimen: Daily (or nightly if it makes you drowsy) zyrtec  5 mg Nighty pepcid 20 mg and benadryl  12.5 mg Use for the next 5 or so days in a row to keep itch away

## 2023-11-14 ENCOUNTER — Other Ambulatory Visit (HOSPITAL_COMMUNITY): Payer: Self-pay

## 2023-11-15 ENCOUNTER — Other Ambulatory Visit: Payer: Self-pay

## 2023-11-16 ENCOUNTER — Other Ambulatory Visit (HOSPITAL_COMMUNITY): Payer: Self-pay

## 2023-11-16 MED ORDER — VITAMIN D (ERGOCALCIFEROL) 50000 UNITS PO CAPS
1.0000 | ORAL_CAPSULE | ORAL | 4 refills | Status: AC
  Filled 2023-11-16: qty 12, 84d supply, fill #0

## 2023-12-01 ENCOUNTER — Ambulatory Visit (INDEPENDENT_AMBULATORY_CARE_PROVIDER_SITE_OTHER): Admitting: Adult Health

## 2023-12-01 ENCOUNTER — Encounter (INDEPENDENT_AMBULATORY_CARE_PROVIDER_SITE_OTHER): Payer: Self-pay | Admitting: Adult Health

## 2023-12-01 VITALS — BP 124/74 | HR 76 | Temp 98.5°F | Ht 67.0 in | Wt 224.0 lb

## 2023-12-01 DIAGNOSIS — Z6835 Body mass index (BMI) 35.0-35.9, adult: Secondary | ICD-10-CM | POA: Diagnosis not present

## 2023-12-01 DIAGNOSIS — R7303 Prediabetes: Secondary | ICD-10-CM

## 2023-12-01 DIAGNOSIS — E559 Vitamin D deficiency, unspecified: Secondary | ICD-10-CM

## 2023-12-01 DIAGNOSIS — Z0000 Encounter for general adult medical examination without abnormal findings: Secondary | ICD-10-CM

## 2023-12-01 DIAGNOSIS — E669 Obesity, unspecified: Secondary | ICD-10-CM

## 2023-12-01 DIAGNOSIS — J40 Bronchitis, not specified as acute or chronic: Secondary | ICD-10-CM | POA: Diagnosis not present

## 2023-12-01 NOTE — Progress Notes (Signed)
 WEIGHT SUMMARY AND BIOMETRICS  Vitals Temp: 98.5 F (36.9 C) BP: 124/74 Pulse Rate: 76 SpO2: 100 %   Anthropometric Measurements Height: 5' 7 (1.702 m) Weight: 224 lb (101.6 kg) BMI (Calculated): 35.08 Weight at Last Visit: 221 lb Weight Lost Since Last Visit: 0 Weight Gained Since Last Visit: 3 lb Starting Weight: 258 lb Total Weight Loss (lbs): 34 lb (15.4 kg)   Body Composition  Body Fat %: 33.3 % Fat Mass (lbs): 74.6 lbs Muscle Mass (lbs): 142.2 lbs Total Body Water (lbs): 97.2 lbs Visceral Fat Rating : 22   Other Clinical Data Fasting: no Labs: no Today's Visit #: 44 Starting Date: 04/08/20    Chief Complaint:   OBESITY Richard [name] is here to discuss his progress with his obesity treatment plan.  He is on the the Category 2 Plan and states he is following his eating plan approximately 70 % of the time.  He states he is exercising Golfing 4 minutes 1 time in the last several weeks.  Interim History:  [name] still continues to experience acute respiratory sx's, specifically productive cough (grey mucus), fatigue, and intermittent wheezing. He denies HA, fever, nasal congestion, and GI upset. He completed the last round of ABX.  He received a steroid injection to treat hives on 11/13/2023  Reviewed Bioempidence Results with pt: Muscle Mass: +1.4 lbs Adipose Mass: +1.2 lbs  Subjective:   1. Healthcare maintenance Reviewed recent healthcare encounters and recent imaging Vital signs stable at OV- O2 sat 100% on RA NAD, however he appears quite fatigued No audible wheezing noted  2. Prediabetes Lab Results  Component Value Date   HGBA1C 5.8 (H) 08/17/2023   HGBA1C 5.9 07/27/2023   HGBA1C 6.2 03/26/2023    2023: Started of Farxiga  for HFpEF and improvement in symptoms.  He is currently on daily Farxiga  10mg  each morning He denies breakthrough polyphagia  3. Bronchitis Mr. Richard [name] still continues to experience acute respiratory  sx's, specifically productive cough (grey mucus), fatigue, and intermittent wheezing. He denies HA, fever, nasal congestion, and GI upset. He completed the last round of ABX.  11/01/2023 Narrative & Impression  CLINICAL DATA:  Cough   EXAM: CHEST - 2 VIEW   COMPARISON:  March 13 2022   FINDINGS: Cardiomediastinal silhouette is normal. Aortic knob is calcified. There is increased interstitial marking of both lung fields predominantly in lower lobes. No pleural effusion. No acute osseous abnormality.   IMPRESSION: Increased interstitial markings of both lung fields. Recommend chest CT for further assessment if clinically warranted.     4. Vitamin D  deficiency  Latest Reference Range & Units 09/29/22 14:10 03/26/23 08:36 08/17/23 09:31  Vitamin D , 25-Hydroxy 30.0 - 100.0 ng/mL 78.7 49 (E) 68.8  (E): External lab result  PCP manages  Vitamin D , Ergocalciferol , 50000 units CAPS Take 1 capsule by mouth weekly Dispense: 12 capsule, Refills: 4 of 4 remaining   Assessment/Plan:   1. Healthcare maintenance (Primary) Increase fluids, rest, and consume easily digestible meals F/u with PCP tomorrow Discussed red flag sx's and if any develop to seek immediate medical assistance. He verbalized understanding and agreement  Discuss generalized pruritis with PCP tomorrow  2. Prediabetes Continue healthy eating and Farxiga  per Cards  3. Bronchitis Increase fluids, rest, and consume easily digestible meals F/u with PCP tomorrow Discussed red flag sx's and if any develop to seek immediate medical assistance. He verbalized understanding and agreement  4. Vitamin D  deficiency Continue Ergocalciferol  per PCP  5. Obesity, Current  BMI 35.1  Richard [name] is currently in the action stage of change. As such, his goal is to maintain weight for now. He has agreed to the Category 2 Plan.   Exercise goals: No exercise has been prescribed at this time. REST during acute respiratory sx's are  present.  Behavioral modification strategies: increasing lean protein intake, decreasing simple carbohydrates, increasing vegetables, increasing water intake, meal planning and cooking strategies, keeping healthy foods in the home, ways to avoid boredom eating, and planning for success.  [name] has agreed to follow-up with our clinic in 4 weeks. He was informed of the importance of frequent follow-up visits to maximize his success with intensive lifestyle modifications for his multiple health conditions.   No exercise has been prescribed at this time. REST during acute respiratory sx's are present.  Objective:   Blood pressure 124/74, pulse 76, temperature 98.5 F (36.9 C), height 5' 7 (1.702 m), weight 224 lb (101.6 kg), SpO2 100%. Body mass index is 35.08 kg/m.  General: Cooperative, alert, well developed, in no acute distress. HEENT: Conjunctivae and lids unremarkable. Cardiovascular: Regular rhythm.  Lungs: Normal work of breathing. Neurologic: No focal deficits.   Lab Results  Component Value Date   CREATININE 0.8 07/27/2023   BUN 23 (A) 03/26/2023   NA 133 (A) 07/27/2023   K 4.2 07/27/2023   CL 96 (A) 07/27/2023   CO2 18 07/27/2023   Lab Results  Component Value Date   ALT 17 07/27/2023   AST 29 07/27/2023   ALKPHOS 102 07/27/2023   BILITOT 0.4 09/29/2022   Lab Results  Component Value Date   HGBA1C 5.8 (H) 08/17/2023   HGBA1C 5.9 07/27/2023   HGBA1C 6.2 03/26/2023   HGBA1C 6.1 (H) 09/29/2022   HGBA1C 6.0 (H) 12/09/2020   Lab Results  Component Value Date   INSULIN  4.9 08/17/2023   INSULIN  1.1 (L) 09/29/2022   INSULIN  12.3 07/24/2020   INSULIN  5.3 04/08/2020   Lab Results  Component Value Date   TSH 1.28 02/17/2022   Lab Results  Component Value Date   CHOL 159 07/27/2023   HDL 77 (A) 07/27/2023   LDLCALC 69 07/27/2023   TRIG 53 07/27/2023   CHOLHDL 2.8 07/26/2019   Lab Results  Component Value Date   VD25OH 68.8 08/17/2023   VD25OH 49  03/26/2023   VD25OH 78.7 09/29/2022   Lab Results  Component Value Date   WBC 4.1 08/17/2023   HGB 15.8 08/17/2023   HCT 48.1 08/17/2023   MCV 97 08/17/2023   PLT 187 08/17/2023   No results found for: IRON, TIBC, FERRITIN  Attestation Statements:   Reviewed by clinician on day of visit: allergies, medications, problem list, medical history, surgical history, family history, social history, and previous encounter notes.  I have reviewed the above documentation for accuracy and completeness, and I agree with the above. -  Amaziah Ghosh d. Eartha Vonbehren, NP-C

## 2023-12-02 ENCOUNTER — Other Ambulatory Visit (HOSPITAL_COMMUNITY): Payer: Self-pay

## 2023-12-02 DIAGNOSIS — I119 Hypertensive heart disease without heart failure: Secondary | ICD-10-CM | POA: Diagnosis not present

## 2023-12-02 DIAGNOSIS — Z23 Encounter for immunization: Secondary | ICD-10-CM | POA: Diagnosis not present

## 2023-12-02 DIAGNOSIS — J441 Chronic obstructive pulmonary disease with (acute) exacerbation: Secondary | ICD-10-CM | POA: Diagnosis not present

## 2023-12-02 DIAGNOSIS — E669 Obesity, unspecified: Secondary | ICD-10-CM | POA: Diagnosis not present

## 2023-12-02 DIAGNOSIS — L239 Allergic contact dermatitis, unspecified cause: Secondary | ICD-10-CM | POA: Diagnosis not present

## 2023-12-02 DIAGNOSIS — R7302 Impaired glucose tolerance (oral): Secondary | ICD-10-CM | POA: Diagnosis not present

## 2023-12-02 DIAGNOSIS — E7800 Pure hypercholesterolemia, unspecified: Secondary | ICD-10-CM | POA: Diagnosis not present

## 2023-12-02 DIAGNOSIS — Z79899 Other long term (current) drug therapy: Secondary | ICD-10-CM | POA: Diagnosis not present

## 2023-12-02 DIAGNOSIS — I2510 Atherosclerotic heart disease of native coronary artery without angina pectoris: Secondary | ICD-10-CM | POA: Diagnosis not present

## 2023-12-02 DIAGNOSIS — J4530 Mild persistent asthma, uncomplicated: Secondary | ICD-10-CM | POA: Diagnosis not present

## 2023-12-02 DIAGNOSIS — E559 Vitamin D deficiency, unspecified: Secondary | ICD-10-CM | POA: Diagnosis not present

## 2023-12-02 DIAGNOSIS — D638 Anemia in other chronic diseases classified elsewhere: Secondary | ICD-10-CM | POA: Diagnosis not present

## 2023-12-02 MED ORDER — EZETIMIBE 10 MG PO TABS
10.0000 mg | ORAL_TABLET | Freq: Every day | ORAL | 4 refills | Status: AC
  Filled 2024-02-08: qty 90, 90d supply, fill #0

## 2023-12-02 MED ORDER — AMLODIPINE BESYLATE 5 MG PO TABS
5.0000 mg | ORAL_TABLET | Freq: Every day | ORAL | 3 refills | Status: AC
  Filled 2023-12-29: qty 90, 90d supply, fill #0
  Filled 2024-03-28: qty 90, 90d supply, fill #1

## 2023-12-07 ENCOUNTER — Other Ambulatory Visit (HOSPITAL_COMMUNITY): Payer: Self-pay

## 2023-12-07 ENCOUNTER — Other Ambulatory Visit: Payer: Self-pay

## 2023-12-13 ENCOUNTER — Other Ambulatory Visit (HOSPITAL_COMMUNITY): Payer: Self-pay

## 2023-12-20 ENCOUNTER — Other Ambulatory Visit (HOSPITAL_COMMUNITY): Payer: Self-pay

## 2023-12-21 ENCOUNTER — Other Ambulatory Visit (HOSPITAL_COMMUNITY): Payer: Self-pay

## 2023-12-21 MED ORDER — IPRATROPIUM-ALBUTEROL 0.5-2.5 (3) MG/3ML IN SOLN
3.0000 mL | Freq: Three times a day (TID) | RESPIRATORY_TRACT | 4 refills | Status: AC
  Filled 2023-12-21: qty 360, 40d supply, fill #0
  Filled 2024-02-20: qty 360, 40d supply, fill #1

## 2023-12-23 ENCOUNTER — Other Ambulatory Visit (HOSPITAL_COMMUNITY): Payer: Self-pay

## 2023-12-27 ENCOUNTER — Other Ambulatory Visit (HOSPITAL_COMMUNITY): Payer: Self-pay

## 2023-12-29 ENCOUNTER — Ambulatory Visit (INDEPENDENT_AMBULATORY_CARE_PROVIDER_SITE_OTHER): Admitting: Adult Health

## 2023-12-29 ENCOUNTER — Encounter (INDEPENDENT_AMBULATORY_CARE_PROVIDER_SITE_OTHER): Payer: Self-pay | Admitting: Adult Health

## 2023-12-29 ENCOUNTER — Other Ambulatory Visit (HOSPITAL_COMMUNITY): Payer: Self-pay

## 2023-12-29 VITALS — BP 134/70 | HR 77 | Temp 98.7°F | Ht 67.0 in | Wt 226.0 lb

## 2023-12-29 DIAGNOSIS — I5032 Chronic diastolic (congestive) heart failure: Secondary | ICD-10-CM

## 2023-12-29 DIAGNOSIS — E669 Obesity, unspecified: Secondary | ICD-10-CM | POA: Diagnosis not present

## 2023-12-29 DIAGNOSIS — Z6835 Body mass index (BMI) 35.0-35.9, adult: Secondary | ICD-10-CM

## 2023-12-29 DIAGNOSIS — I10 Essential (primary) hypertension: Secondary | ICD-10-CM

## 2023-12-29 DIAGNOSIS — I110 Hypertensive heart disease with heart failure: Secondary | ICD-10-CM | POA: Diagnosis not present

## 2023-12-29 DIAGNOSIS — J40 Bronchitis, not specified as acute or chronic: Secondary | ICD-10-CM | POA: Diagnosis not present

## 2023-12-29 DIAGNOSIS — Z6839 Body mass index (BMI) 39.0-39.9, adult: Secondary | ICD-10-CM

## 2023-12-29 NOTE — Progress Notes (Signed)
 WEIGHT SUMMARY AND BIOMETRICS  Vitals Temp: 98.7 F (37.1 C) BP: 134/70 Pulse Rate: 77 SpO2: 99 %   Anthropometric Measurements Height: 5' 7 (1.702 m) Weight: 226 lb (102.5 kg) BMI (Calculated): 35.39 Weight at Last Visit: 224LB Weight Lost Since Last Visit: 0lb Weight Gained Since Last Visit: 2lb Starting Weight: 258lb Total Weight Loss (lbs): 32 lb (14.5 kg)   Body Composition  Body Fat %: 34.6 % Fat Mass (lbs): 78.4 lbs Muscle Mass (lbs): 141 lbs Total Body Water (lbs): 100 lbs Visceral Fat Rating : 23   Other Clinical Data Fasting: No Labs: No Today's Visit #: 45 Starting Date: 04/08/20    Chief Complaint:   OBESITY Richard [name] is here to discuss his progress with his obesity treatment plan.  He is on the the Category 2 Plan and states he is following his eating plan approximately 65 % of the time.  He states he is exercising Golf 60 minutes 2 times per week.  Interim History:  Last OV at [facility] was 12/01/2023 He was seen by PCP on 9/25/205- referred to Allergist- has upcoming new pt appt.  He reports complete resolution of acute respiratory sx's.  He has resumed rounds of golf- at least twice weekly  He attended [employer]- lots of walking and outdoor activities   Subjective:   1. Bronchitis 11/01/2023 DG Chest 2 VIEW Narrative & Impression  CLINICAL DATA:  Cough   EXAM: CHEST - 2 VIEW   COMPARISON:  March 13 2022   FINDINGS: Cardiomediastinal silhouette is normal. Aortic knob is calcified. There is increased interstitial marking of both lung fields predominantly in lower lobes. No pleural effusion. No acute osseous abnormality.   IMPRESSION: Increased interstitial markings of both lung fields. Recommend chest CT for further assessment if clinically warranted.   2. Essential hypertension BO stable and at goal at OV He denies CP with exertion  3. Chronic heart failure with preserved ejection fraction (HFpEF) (HCC) Cards OV  Notes: Date:  06/02/2023    ID:  Richard [name], DOB September 08, 1945, MRN [id number]   PCP:  Hillman Bare, MD  Cardiologist:  Victory Sharps, MD Sleep Medicine:  Wilbert Bihari, MD Electrophysiologist:  None    Chief Complaint:  OSA   History of Present Illness:     Richard [name] is a 78 y.o. male with a hx of Obesity, snoring and witnessed Apnea.  He was referred for sleep study which showed mild OSA with an AHI of 13.5/hr and O2 sats of 88% with events.  He underwent BiPAP titration to 13/9cm H2O.     He is doing well with his PAP device and thinks that he has gotten used to it.  He tolerates the full face mask and feels the pressure is adequate.  Since going on PAP he feels rested in the am and has no significant daytime sleepiness.  He denies any significant mouth or nasal dryness or nasal congestion.  He does not think that he snores.      Assessment/Plan:   1. Bronchitis Monitor for sx's Establish with Allergist  2. Essential hypertension Continue healthy eating and regular exercise (golf). Add in strength training at least once per week  3. Chronic heart failure with preserved ejection fraction (HFpEF) (HCC) Cards OV Notes: ASSESSMENT & PLAN:     1.    2.  HTN -BP controlled on exam today -continue prescription drug management with Amlodipine  5mg  daily, Carvedilol  12.5mg  BID and Chlorthalidone  25mg  daily  with PRN refills   Medication Adjustments/Labs and Tests Ordered: Current medicines are reviewed at length with the patient today.  Concerns regarding medicines are outlined above.  Tests Ordered: No orders of the defined types were placed in this encounter.   Medication Changes: No orders of the defined types were placed in this encounter.     Disposition:  Follow up in 1 year(s)  4. Obesity, Current BMI 35.5 (Primary)  Little is currently in the action stage of change. As such, his goal is to continue with weight loss efforts. He has agreed to the  Category 2 Plan.   Exercise goals: Older adults should follow the adult guidelines. When older adults cannot meet the adult guidelines, they should be as physically active as their abilities and conditions will allow.  Older adults should do exercises that maintain or improve balance if they are at risk of falling.  Older adults should determine their level of effort for physical activity relative to their level of fitness.  Older adults with chronic conditions should understand whether and how their conditions affect their ability to do regular physical activity safely. Add in Strength Training once per week.  Behavioral modification strategies: increasing lean protein intake, decreasing simple carbohydrates, increasing vegetables, increasing water intake, no skipping meals, meal planning and cooking strategies, keeping healthy foods in the home, ways to avoid boredom eating, and planning for success.  Richard [name] has agreed to follow-up with our clinic in 4 weeks. He was informed of the importance of frequent follow-up visits to maximize his success with intensive lifestyle modifications for his multiple health conditions.   Objective:   Blood pressure 134/70, pulse 77, temperature 98.7 F (37.1 C), height 5' 7 (1.702 m), weight 226 lb (102.5 kg), SpO2 99%. Body mass index is 35.4 kg/m.  General: Cooperative, alert, well developed, in no acute distress. HEENT: Conjunctivae and lids unremarkable. Cardiovascular: Regular rhythm.  Lungs: Normal work of breathing. Neurologic: No focal deficits.   Lab Results  Component Value Date   CREATININE 0.8 07/27/2023   BUN 23 (A) 03/26/2023   NA 133 (A) 07/27/2023   K 4.2 07/27/2023   CL 96 (A) 07/27/2023   CO2 18 07/27/2023   Lab Results  Component Value Date   ALT 17 07/27/2023   AST 29 07/27/2023   ALKPHOS 102 07/27/2023   BILITOT 0.4 09/29/2022   Lab Results  Component Value Date   HGBA1C 5.8 (H) 08/17/2023   HGBA1C 5.9 07/27/2023    HGBA1C 6.2 03/26/2023   HGBA1C 6.1 (H) 09/29/2022   HGBA1C 6.0 (H) 12/09/2020   Lab Results  Component Value Date   INSULIN  4.9 08/17/2023   INSULIN  1.1 (L) 09/29/2022   INSULIN  12.3 07/24/2020   INSULIN  5.3 04/08/2020   Lab Results  Component Value Date   TSH 1.28 02/17/2022   Lab Results  Component Value Date   CHOL 159 07/27/2023   HDL 77 (A) 07/27/2023   LDLCALC 69 07/27/2023   TRIG 53 07/27/2023   CHOLHDL 2.8 07/26/2019   Lab Results  Component Value Date   VD25OH 68.8 08/17/2023   VD25OH 49 03/26/2023   VD25OH 78.7 09/29/2022   Lab Results  Component Value Date   WBC 4.1 08/17/2023   HGB 15.8 08/17/2023   HCT 48.1 08/17/2023   MCV 97 08/17/2023   PLT 187 08/17/2023   No results found for: IRON, TIBC, FERRITIN  Attestation Statements:   Reviewed by clinician on day of visit: allergies, medications, problem list, medical  history, surgical history, family history, social history, and previous encounter notes.  Time spent on visit including pre-visit chart review and post-visit care and charting was 26 minutes.   I have reviewed the above documentation for accuracy and completeness, and I agree with the above. -  Ludmilla Mcgillis d. Koriana Stepien, NP-C

## 2024-01-04 NOTE — Progress Notes (Unsigned)
 New Patient Note  RE: Richard [name] MRN: [id number] DOB: [birth year] Date of Office Visit: 01/05/2024  Consult requested by: Hillman Bare, MD Primary care provider: Hillman Bare, MD  Chief Complaint: No chief complaint on file.  History of Present Illness: I had the pleasure of seeing Richard [name] for initial evaluation at the [facility] on 01/05/2024. He is a 78 y.o. male, who is referred here by Hillman Bare, MD for the evaluation of contact dermatitis.  Discussed the use of AI scribe software for clinical note transcription with the patient, who gave verbal consent to proceed.  History of Present Illness             Rash started about *** ago. Mainly occurs on his ***. Describes them as ***. Individual rashes lasts about ***. No ecchymosis upon resolution. Associated symptoms include: ***.  Frequency of episodes: ***. Suspected triggers are ***. Denies any *** fevers, chills, changes in medications, foods, personal care products or recent infections. He has tried the following therapies: *** with *** benefit. Systemic steroids ***. Currently on ***.  Previous work up includes: ***. Previous history of rash/hives: ***. Patient is up to date with the following cancer screening tests: ***.  Assessment and Plan: Richard [name] is a 78 y.o. male with: ***  Assessment and Plan               No follow-ups on file.  No orders of the defined types were placed in this encounter.  Lab Orders  No laboratory test(s) ordered today    Other allergy screening: Asthma: {Blank single:19197::yes,no} Rhino conjunctivitis: {Blank single:19197::yes,no} Food allergy: {Blank single:19197::yes,no} Medication allergy: {Blank single:19197::yes,no} Hymenoptera allergy: {Blank single:19197::yes,no} Urticaria: {Blank single:19197::yes,no} Eczema:{Blank single:19197::yes,no} History of recurrent infections suggestive  of immunodeficency: {Blank single:19197::yes,no}  Diagnostics: Spirometry:  Tracings reviewed. His effort: {Blank single:19197::Good reproducible efforts.,It was hard to get consistent efforts and there is a question as to whether this reflects a maximal maneuver.,Poor effort, data can not be interpreted.} FVC: ***L FEV1: ***L, ***% predicted FEV1/FVC ratio: ***% Interpretation: {Blank single:19197::Spirometry consistent with mild obstructive disease,Spirometry consistent with moderate obstructive disease,Spirometry consistent with severe obstructive disease,Spirometry consistent with possible restrictive disease,Spirometry consistent with mixed obstructive and restrictive disease,Spirometry uninterpretable due to technique,Spirometry consistent with normal pattern,No overt abnormalities noted given today's efforts}.  Please see scanned spirometry results for details.  Skin Testing: {Blank single:19197::Select foods,Environmental allergy panel,Environmental allergy panel and select foods,Food allergy panel,None,Deferred due to recent antihistamines use}. *** Results discussed with patient/family.   Past Medical History: Patient Active Problem List   Diagnosis Date Noted  . Chronic heart failure with preserved ejection fraction (HFpEF) 04/17/2022  . SOBOE (shortness of breath on exertion) 01/06/2022  . Fatigue 01/06/2022  . Transient total loss of muscle tone 01/06/2022  . Essential hypertension 12/02/2021  . Mild cognitive impairment of uncertain or unknown etiology 09/30/2021  . Acid reflux disease 09/29/2021  . Heart failure, diastolic 09/29/2021  . Benign essential hypertension   . Internal hemorrhoids   . History of colonic polyps   . Pure hypercholesterolemia   . Class 2 severe obesity with serious comorbidity and body mass index (BMI) of 39.0 to 39.9 in adult 06/04/2020  . COPD, 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  . Prediabetes 04/09/2020  . OSA treated  with BiPAP 04/24/2018  . Right bundle branch block 06/27/2014  . Hyperlipidemia 06/26/2013  . Atherosclerosis of coronary artery 04/30/2013  . Asthma 04/30/2013  . 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  Myocardial infarction 2004   Past Medical History:  Diagnosis Date  . Acid reflux disease   . Arthritis   . Asthma 04/30/2013  . Atherosclerosis of coronary artery 04/30/2013   Ventricular fibrillation; circumflex stent x2 in 2004  . Benign essential hypertension   . CAP (community acquired pneumonia) 04/30/2013  . Chronic heart failure with preserved ejection fraction (HFpEF) 04/17/2022  . Class 2 severe obesity with serious comorbidity and body mass index (BMI) of 39.0 to 39.9 in adult 06/04/2020  . COPD, 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  . Dyspnea on exertion 09/13/2017  . Edema, lower extremity   . Essential hypertension 12/02/2021  . Excessive cerumen in both ear canals 01/05/2022  . Fatigue 01/06/2022  . Heart failure, diastolic   . Hemorrhage of rectum and anus   . History of colonic polyps   . Hyperlipidemia 06/26/2013  . Hypoxia 04/30/2013  . Influenza due to identified novel influenza A virus with other respiratory manifestations 05/01/2013  . Internal hemorrhoids   . Mild cognitive impairment of uncertain or unknown etiology 09/30/2021  . Myocardial infarction 2004   Dr. Claudene, at [facility], yearly visits  . OSA treated with BiPAP 04/24/2018   Mild OSA with an AHI of 13.5/hr and O2 sats of 88%. Now on BiPAP at 13/9cm H2O.   . 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 Prediabetes 04/09/2020  . Pure hypercholesterolemia   . Right bundle branch block 06/27/2014    EKG performed 06/27/2014  . Transient total loss of muscle tone 01/06/2022  . Vitamin D  deficiency    Past Surgical History: Past Surgical History:  Procedure Laterality Date  . CARDIAC CATHETERIZATION  2004   with 2 stents  . CARDIOVASCULAR STRESS TEST  2011  . COLONOSCOPY    . POLYPECTOMY   02/17/2011   Procedure: POLYPECTOMY NASAL;  Surgeon: Alm LITTIE Bouche;  Location: [facility] OR;  Service: ENT;  Laterality: N/A;  . SINUS ENDO W/FUSION  02/17/2011   Procedure: ENDOSCOPIC SINUS SURGERY WITH FUSION NAVIGATION;  Surgeon: Alm LITTIE Bouche;  Location: [facility] OR;  Service: ENT;  Laterality: N/A;  . TONSILLECTOMY    . US  ECHOCARDIOGRAPHY     Medication List:  Current Outpatient Medications  Medication Sig Dispense Refill  . albuterol  (PROVENTIL ) (2.5 MG/3ML) 0.083% nebulizer solution Take 3 mLs (2.5 mg total) by nebulization 2 times daily and at bedtime as needed. 90 mL 6  . albuterol  (VENTOLIN  HFA) 108 (90 Base) MCG/ACT inhaler Inhale 2 puffs into the lungs daily as needed. 6.7 g 3  . amLODipine  (NORVASC ) 5 MG tablet Take 1 tablet (5 mg total) by mouth daily. 90 tablet 3  . aspirin  EC 81 MG tablet Take 1 tablet (81 mg total) by mouth daily. 90 tablet 3  . atorvastatin  (LIPITOR) 80 MG tablet Take 1 tablet (80 mg total) by mouth at bedtime. 90 tablet 4  . bisacodyl  5 MG EC tablet use as directed (Patient not taking: Reported on 12/29/2023) 8 tablet 0  . carvedilol  (COREG ) 12.5 MG tablet Take 1 tablet (12.5 mg total) by mouth 2 (two) times daily. 60 tablet 5  . cetirizine  (ZYRTEC ) 5 MG tablet Take 1 tablet (5 mg total) by mouth daily. 30 tablet 0  . chlorthalidone  (HYGROTON ) 25 MG tablet Take 1/2 tablet (12.5 mg total) by mouth daily. 90 tablet 3  . COVID-19 mRNA Vac-TriS, Pfizer, (PFIZER-BIONT COVID-19 VAC-TRIS) SUSP injection Inject into the muscle. 0.3 mL 0  . Cyanocobalamin  (VITAMIN B 12 PO) Take by mouth.    SABRA  dapagliflozin  propanediol (FARXIGA ) 10 MG TABS tablet Take 1 tablet (10 mg total) by mouth daily before breakfast. 90 tablet 3  . donepezil  (ARICEPT ) 5 MG tablet Take 1 tablet (5 mg total) by mouth daily. 90 tablet 3  . doxycycline  (VIBRAMYCIN ) 100 MG capsule Take 100 mg by mouth 2 (two) times daily. (Patient not taking: Reported on 12/29/2023)    . ezetimibe  (ZETIA ) 10 MG tablet  Take 1 tablet (10 mg total) by mouth daily. 90 tablet 4  . fluticasone  furoate-vilanterol (BREO ELLIPTA ) 200-25 MCG/ACT AEPB Inhale 1 puff into the lungs daily. 180 each 3  . guaiFENesin  (MUCINEX ) 600 MG 12 hr tablet Take by mouth 2 (two) times daily.    SABRA ipratropium-albuterol  (DUONEB) 0.5-2.5 (3) MG/3ML SOLN Inhale 3 mLs by nebulization 2 (two) times daily and once at bedtime as needed. (Patient not taking: Reported on 12/29/2023) 360 mL 4  . ipratropium-albuterol  (DUONEB) 0.5-2.5 (3) MG/3ML SOLN Inhale 3 mLs by nebulization 2 (two) times daily and once at bedtime as needed. 360 mL 4  . montelukast  (SINGULAIR ) 10 MG tablet Take 1 tablet by mouth daily 90 tablet 3  . nitroGLYCERIN  (NITROSTAT ) 0.4 MG SL tablet Place 1 tablet (0.4 mg total) under the tongue every 5 (five) minutes as needed. For chest pain 25 tablet 5  . omeprazole  (PRILOSEC) 40 MG capsule Take 1 capsule (40 mg total) by mouth 2 (two) times daily. 180 capsule 4  . polyethylene glycol powder (GLYCOLAX/MIRALAX) 17 GM/SCOOP powder See admin instructions.    . polyethylene glycol-electrolytes (NULYTELY) 420 g solution Use as directed 8000 mL 0  . potassium chloride  (MICRO-K ) 10 MEQ CR capsule Take 1 capsule (10 mEq total) by mouth daily. (Patient not taking: Reported on 12/29/2023) 90 capsule 4  . promethazine -dextromethorphan  (PROMETHAZINE -DM) 6.25-15 MG/5ML syrup Take 5 mLs by mouth every 4-6 hours as needed for cough. 240 mL 0  . tamsulosin  (FLOMAX ) 0.4 MG CAPS capsule Take 1 capsule (0.4 mg total) by mouth daily. 90 capsule 3  . Vitamin D , Ergocalciferol , 50000 units CAPS Take 1 capsule by mouth weekly 12 capsule 4   No current facility-administered medications for this visit.   Allergies: Allergies  Allergen Reactions  . Bovine (Beef) Protein-Containing Drug Products     *difficulty breathing*  . Chicken Protein     *congestion*  . Lisinopril Swelling    Angioedema   . Peanut-Containing Drug Products     *congestion*    Social History: Social History   Socioeconomic History  . Marital status: Married    Spouse name: Mary  . Number of children: Not on file  . Years of education: 61  . Highest education level: Bachelor's degree (e.g., BA, AB, BS)  Occupational History  . Not on file  Tobacco Use  . Smoking status: Former    Passive exposure: Never  . Smokeless tobacco: Never  Vaping Use  . Vaping status: Never Used  Substance and Sexual Activity  . Alcohol use: Yes    Comment: infrequent  . Drug use: No  . Sexual activity: Not on file  Other Topics Concern  . 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 Smoking: *** Occupation: ***  Environmental HistorySurveyor, Minerals in the house: Network Engineer in the family room: {Blank single:19197::yes,no} Carpet in the bedroom: {Blank single:19197::yes,no} Heating: {Blank single:19197::electric,gas,heat pump} Cooling: {Blank single:19197::central,window,heat pump} Pet: {Blank single:19197::yes ***,no}  Family History: Family History  Problem Relation Age of Onset  . Cancer Mother   . Alzheimer's disease Mother   . Stroke Mother   . Heart attack Father   . Diabetes Father   . Hypertension Father   . Hypertension Sister   . Diabetes Sister   . Alzheimer's disease Maternal Aunt        total of 4 maternal aunts with AD  . Alzheimer's disease Cousin   . Alzheimer's disease Maternal Uncle    Problem                               Relation Asthma                                   *** Eczema                                *** Food allergy                           *** Allergic rhino conjunctivitis     ***  Review of Systems  Constitutional:  Negative for appetite change, chills, fever and unexpected weight change.  HENT:  Negative for congestion and rhinorrhea.   Eyes:  Negative for itching.  Respiratory:  Negative for cough, chest tightness, shortness of breath and wheezing.   Cardiovascular:  Negative for chest pain.  Gastrointestinal:  Negative for abdominal pain.  Genitourinary:  Negative for difficulty urinating.  Skin:  Negative for rash.  Neurological:  Negative for headaches.    Objective: There were no vitals taken for this visit. There is no height or weight on file to calculate BMI. Physical Exam Vitals and nursing note reviewed.  Constitutional:      Appearance: Normal appearance. He is well-developed.  HENT:     Head: Normocephalic and atraumatic.     Right Ear: Tympanic membrane and external ear normal.     Left Ear: Tympanic membrane and external ear normal.     Nose: Nose normal.     Mouth/Throat:     Mouth: Mucous membranes are moist.     Pharynx: Oropharynx is clear.  Eyes:     Conjunctiva/sclera: Conjunctivae normal.  Cardiovascular:     Rate and Rhythm: Normal rate and regular rhythm.     Heart sounds: Normal heart sounds. No murmur heard.    No friction rub. No gallop.  Pulmonary:     Effort: Pulmonary effort is normal.     Breath sounds: Normal breath sounds. No wheezing, rhonchi or rales.  Musculoskeletal:     Cervical back: Neck supple.  Skin:    General: Skin is warm.     Findings: No rash.  Neurological:     Mental Status: He is alert and oriented to person, place, and time.  Psychiatric:        Behavior: Behavior normal.   The plan was reviewed with the patient/family, and all questions/concerned were addressed.  It  was my pleasure to see Richard [name] today and participate in his care. Please feel free to contact me with any questions or concerns.  Sincerely,  Orlan Cramp, DO Allergy &  Immunology  [facility]  [place] office: [phone] [facility] office: [phone]

## 2024-01-05 ENCOUNTER — Encounter: Payer: Self-pay | Admitting: Allergy

## 2024-01-05 ENCOUNTER — Ambulatory Visit: Admitting: Allergy

## 2024-01-05 VITALS — BP 128/62 | HR 84 | Temp 98.7°F | Ht 67.25 in | Wt 232.0 lb

## 2024-01-05 DIAGNOSIS — Z888 Allergy status to other drugs, medicaments and biological substances status: Secondary | ICD-10-CM

## 2024-01-05 DIAGNOSIS — L508 Other urticaria: Secondary | ICD-10-CM | POA: Diagnosis not present

## 2024-01-05 DIAGNOSIS — T783XXA Angioneurotic edema, initial encounter: Secondary | ICD-10-CM

## 2024-01-05 NOTE — Patient Instructions (Addendum)
 Rash/hives I'm not sure what caused this exactly. I don't think it was due to any environmental/food allergies as it resolved fairly quickly. It may have been infection induced as you had some respiratory infection like symptoms during this time. We will get some bloodwork to rule things out.  Keep track of rashes and take pictures. Write down what you had done during flares.  See below for proper skin care. At first onset o hives/itching take the following medications: Start zyrtec  (cetirizine ) 10mg  OR allegra (fexofenadine) 180mg  1 to 2 times per day.  Get bloodwork We are ordering labs, so please allow 1-2 weeks for the results to come back. With the newly implemented Cures Act, the labs might be visible to you at the same time that they become visible to me. However, I will not address the results until all of the results are back, so please be patient.  In the meantime, continue recommendations in your patient instructions, including avoidance measures (if applicable), until you hear from me.  Follow up as needed.  Skin care recommendations  Bath time: Always use lukewarm water. AVOID very hot or cold water. Keep bathing time to 5-10 minutes. Do NOT use bubble bath. Use a mild soap and use just enough to wash the dirty areas. Do NOT scrub skin vigorously.  After bathing, pat dry your skin with a towel. Do NOT rub or scrub the skin.  Moisturizers and prescriptions:  ALWAYS apply moisturizers immediately after bathing (within 3 minutes). This helps to lock-in moisture. Use the moisturizer several times a day over the whole body. Good summer moisturizers include: Aveeno, CeraVe, Cetaphil. Good winter moisturizers include: Aquaphor, Vaseline, Cerave, Cetaphil, Eucerin, Vanicream. When using moisturizers along with medications, the moisturizer should be applied about one hour after applying the medication to prevent diluting effect of the medication or moisturize around where you  applied the medications. When not using medications, the moisturizer can be continued twice daily as maintenance.  Laundry and clothing: Avoid laundry products with added color or perfumes. Use unscented hypo-allergenic laundry products such as Tide free, Cheer free & gentle, and All free and clear.  If the skin still seems dry or sensitive, you can try double-rinsing the clothes. Avoid tight or scratchy clothing such as wool. Do not use fabric softeners or dyer sheets.

## 2024-01-06 LAB — PROTEIN ELECTROPHORESIS, URINE REFLEX

## 2024-01-08 LAB — PROTEIN ELECTROPHORESIS, SERUM
A/G Ratio: 1.2 (ref 0.7–1.7)
Albumin ELP: 3.8 g/dL (ref 2.9–4.4)
Alpha 1: 0.3 g/dL (ref 0.0–0.4)
Alpha 2: 0.7 g/dL (ref 0.4–1.0)
Beta: 1.1 g/dL (ref 0.7–1.3)
Gamma Globulin: 1.1 g/dL (ref 0.4–1.8)
Globulin, Total: 3.2 g/dL (ref 2.2–3.9)
Total Protein: 7 g/dL (ref 6.0–8.5)

## 2024-01-08 LAB — PROTEIN ELECTROPHORESIS, URINE REFLEX
Albumin ELP, Urine: 50.4 %
Alpha-1-Globulin, U: 4 %
Alpha-2-Globulin, U: 10.7 %
Beta Globulin, U: 20.6 %
Gamma Globulin, U: 14.3 %
Protein, Ur: 14 mg/dL

## 2024-01-09 LAB — CHRONIC URTICARIA (CU) EVAL
ALT: 20 IU/L (ref 0–44)
Basophils Absolute: 0.1 x10E3/uL (ref 0.0–0.2)
Basos: 1 %
CRP: 2 mg/L (ref 0–10)
EOS (ABSOLUTE): 0.2 x10E3/uL (ref 0.0–0.4)
Eos: 5 %
Hematocrit: 46.7 % (ref 37.5–51.0)
Hemoglobin: 15.3 g/dL (ref 13.0–17.7)
Immature Grans (Abs): 0 x10E3/uL (ref 0.0–0.1)
Immature Granulocytes: 0 %
Lymphocytes Absolute: 1.2 x10E3/uL (ref 0.7–3.1)
Lymphs: 26 %
MCH: 31.4 pg (ref 26.6–33.0)
MCHC: 32.8 g/dL (ref 31.5–35.7)
MCV: 96 fL (ref 79–97)
Monocytes Absolute: 0.4 x10E3/uL (ref 0.1–0.9)
Monocytes: 8 %
Neutrophils Absolute: 2.6 x10E3/uL (ref 1.4–7.0)
Neutrophils: 60 %
Platelets: 234 x10E3/uL (ref 150–450)
Pooled Donor- BAT CU: 3.87 % (ref 0.00–10.60)
RBC: 4.88 x10E6/uL (ref 4.14–5.80)
RDW: 15.3 % (ref 11.6–15.4)
Sed Rate: 18 mm/h (ref 0–30)
TSH: 0.625 u[IU]/mL (ref 0.450–4.500)
Thyroperoxidase Ab SerPl-aCnc: <9 [IU]/mL (ref 0–34)
WBC: 4.5 x10E3/uL (ref 3.4–10.8)

## 2024-01-09 LAB — ALPHA-GAL PANEL
Allergen Lamb IgE: 0.15 kU/L — AB
Beef IgE: 0.14 kU/L — AB
IgE (Immunoglobulin E), Serum: 3654 [IU]/mL — ABNORMAL HIGH (ref 6–495)
O215-IgE Alpha-Gal: <0.1 kU/L
Pork IgE: 0.14 kU/L — AB

## 2024-01-09 LAB — COMPREHENSIVE METABOLIC PANEL WITH GFR
AST: 36 IU/L (ref 0–40)
Albumin: 4.4 g/dL (ref 3.8–4.8)
Alkaline Phosphatase: 90 IU/L (ref 47–123)
BUN/Creatinine Ratio: 19 (ref 10–24)
BUN: 18 mg/dL (ref 8–27)
Bilirubin Total: 0.6 mg/dL (ref 0.0–1.2)
CO2: 22 mmol/L (ref 20–29)
Calcium: 9.4 mg/dL (ref 8.6–10.2)
Chloride: 94 mmol/L — ABNORMAL LOW (ref 96–106)
Creatinine, Ser: 0.94 mg/dL (ref 0.76–1.27)
Globulin, Total: 2.5 g/dL (ref 1.5–4.5)
Glucose: 103 mg/dL — ABNORMAL HIGH (ref 70–99)
Potassium: 4.1 mmol/L (ref 3.5–5.2)
Sodium: 132 mmol/L — ABNORMAL LOW (ref 134–144)
Total Protein: 6.9 g/dL (ref 6.0–8.5)
eGFR: 83 mL/min/1.73 (ref >59)

## 2024-01-09 LAB — TRYPTASE: Tryptase: 5.8 ug/L (ref 2.2–13.2)

## 2024-01-09 LAB — ANTINUCLEAR ANTIBODIES, IFA: ANA Titer 1: NEGATIVE

## 2024-01-17 ENCOUNTER — Ambulatory Visit: Payer: Self-pay | Admitting: Allergy

## 2024-01-19 ENCOUNTER — Other Ambulatory Visit (HOSPITAL_COMMUNITY): Payer: Self-pay

## 2024-01-26 ENCOUNTER — Ambulatory Visit (INDEPENDENT_AMBULATORY_CARE_PROVIDER_SITE_OTHER): Admitting: Adult Health

## 2024-02-09 ENCOUNTER — Other Ambulatory Visit (HOSPITAL_COMMUNITY): Payer: Self-pay

## 2024-02-09 ENCOUNTER — Encounter (INDEPENDENT_AMBULATORY_CARE_PROVIDER_SITE_OTHER): Payer: Self-pay | Admitting: Adult Health

## 2024-02-09 ENCOUNTER — Ambulatory Visit (INDEPENDENT_AMBULATORY_CARE_PROVIDER_SITE_OTHER): Payer: Self-pay | Admitting: Adult Health

## 2024-02-09 VITALS — BP 138/71 | HR 80 | Temp 98.2°F | Ht 67.0 in | Wt 230.0 lb

## 2024-02-09 DIAGNOSIS — Z6836 Body mass index (BMI) 36.0-36.9, adult: Secondary | ICD-10-CM | POA: Diagnosis not present

## 2024-02-09 DIAGNOSIS — R7303 Prediabetes: Secondary | ICD-10-CM

## 2024-02-09 DIAGNOSIS — L508 Other urticaria: Secondary | ICD-10-CM

## 2024-02-09 DIAGNOSIS — Z6839 Body mass index (BMI) 39.0-39.9, adult: Secondary | ICD-10-CM

## 2024-02-09 DIAGNOSIS — E559 Vitamin D deficiency, unspecified: Secondary | ICD-10-CM | POA: Diagnosis not present

## 2024-02-09 DIAGNOSIS — E669 Obesity, unspecified: Secondary | ICD-10-CM | POA: Diagnosis not present

## 2024-02-09 NOTE — Progress Notes (Signed)
 WEIGHT SUMMARY AND BIOMETRICS  Vitals Temp: 98.2 F (36.8 C) BP: 138/71 Pulse Rate: 80 SpO2: 98 %   Anthropometric Measurements Height: 5' 7 (1.702 m) Weight: 230 lb (104.3 kg) BMI (Calculated): 36.01 Weight at Last Visit: 226lb Weight Lost Since Last Visit: 0lb Weight Gained Since Last Visit: 4lb Starting Weight: 258lb Total Weight Loss (lbs): 28 lb (12.7 kg)   Body Composition  Body Fat %: 34.7 % Fat Mass (lbs): 79.8 lbs Muscle Mass (lbs): 143 lbs Total Body Water (lbs): 99 lbs Visceral Fat Rating : 24   Other Clinical Data Fasting: No Labs: No Today's Visit #: 94 Starting Date: 04/08/20    Chief Complaint:   OBESITY Richard [name] is here to discuss his progress with his obesity treatment plan.  He is on the the Category 2 Plan and states he is following his eating plan approximately 60 % of the time.  He states he is exercising Golfing  4 hours 2 times per week.  Interim History:  Reviewed Bioimpedance Results with pt: Muscle Mass:+2 lbs Adipose Mass: +1.4 lbs  He had initial OV with Allergist- reviewed OV notes, labs, and prognosis.  He denies any recent skin irritations/eruptions.  He travelled to [place]  to celebrate Thanksgiving with his extended family.  He will remain local for the Christmas season  Subjective:   1. Prediabetes 2023: Started of Farxiga  for HFpEF and improvement in symptoms.  He is currently on daily Farxiga  10mg  each morning  2. Vitamin D  deficiency  Latest Reference Range & Units 09/29/22 14:10 03/26/23 08:36 08/17/23 09:31  Vitamin D , 25-Hydroxy 30.0 - 100.0 ng/mL 78.7 49 (E) 68.8   Vit D Level stable and at goal  3. Acute urticaria 01/05/2024 Allergist OV Notes Approximately one month ago, he developed welts and hives on his chest, stomach, back, and arms. The rash was raised and itchy without significant color change. The welts persisted for about two weeks and did not migrate. He sought care at an urgent care  facility where he received a steroid shot, Zyrtec  and Pepcid which resolved the rash.    Prior to the onset of the rash, he had completed a course of Augmentin  for an infection. He reports no issues with Augmentin  in the past, and the only medication allergy he is aware of is lisinopril, which caused lip swelling. He denies any recent changes in diet, personal care products, or laundry detergents.   He experienced additional symptoms of a runny nose and cough around the time of the rash. He has a history of COPD and uses Breo 200 mcg one puff daily and a nebulizer twice a day. No recent wheezing, shortness of breath, or chest tightness related to his COPD.   His family history is significant for colon cancer in his mother, and he has a personal history of polyps, for which he undergoes regular colonoscopies. He states he is not aware of any family history of asthma, allergies, hives, or eczema.   He has no known food allergies, despite previous records indicating beef, chicken and peanut allergies, which he has has been eating with no issues. He also denies any environmental or seasonal allergies. He is up to date on his flu and COVID vaccinations, with the last COVID shot administered after the resolution of the rash.       Rash started about 1 month ago. This can occur anywhere on his body. Describes them as itchy, raised. Took 2 weeks to resolve. No ecchymosis upon resolution.  Frequency of episodes: one episode for 2 weeks and now just has residual itching. Suspected triggers are unknown. Denies any foods, personal care products. He has tried the following therapies: antihistamines, steroid injection with good benefit. Systemic steroids: yes. Currently on no daily antihistamines.  Previous work up includes: none. Previous history of rash/hives: no. Patient is up to date with the following cancer screening tests: physical exam, colonoscopy.   11/13/2023 UC visit: 3 days ago started having hives  on parts of the body Itching, not painful Started when he was sitting on the couch watching TV No known exposure to allergens. No new medications or foods No oral or respiratory symptoms  Took benadryl  at home last 2 nights.    [place] - new sheets and products there.    He finished course of augmentin  1 week ago Prediabetic. Last A1c was 5.8   Hives/urticaria Offered IM steroid in clinic for acute itch relief. At home antihistamine regimen discussed. Will use half dose zyrtec  (5 mg), starting at bedtime. He has been using benadryl  without getting drowsy, can continue this with precautions. Also can use pepcid for H2 blocker effect. Advised reasons to return to clinic, ED precautions. Agrees to plan, no questions    Assessment/Plan:   1. Prediabetes Continue healthy eating and daily activity Monitor Labs  2. Vitamin D  deficiency Monitor Labs  3. Acute urticaria 01/05/2024 Allergist OV Notes: Assessment and Plan: Richard [name] is a 78 y.o. male with: Acute urticaria 2 week history of acute hives that resolved with systemic steroids and antihistamines. No triggers found but he did have some type of infection and was treated with antibiotics 1 week prior. Denies any other changes in medications, diet or personal care products.  Etiology unclear.  I don't think it was due to any environmental/food allergies based on clinical history.  It may have been infection induced as you had some respiratory infection like symptoms during this time. We will get some bloodwork to rule things out. Keep track of rashes and take pictures. Write down what you had done during flares.  See below for proper skin care. At first onset o hives/itching take the following medications: Start zyrtec  (cetirizine ) 10mg  OR allegra (fexofenadine) 180mg  1 to 2 times per day.  4. Obesity, Current BMI 36.1 (Primary)  Richard [name] is currently in the action stage of change. As such, his goal  is to continue with weight loss efforts. He has agreed to the Category 2 Plan.   Exercise goals: Older adults should follow the adult guidelines. When older adults cannot meet the adult guidelines, they should be as physically active as their abilities and conditions will allow.  Older adults should do exercises that maintain or improve balance if they are at risk of falling.  Older adults should determine their level of effort for physical activity relative to their level of fitness.  Older adults with chronic conditions should understand whether and how their conditions affect their ability to do regular physical activity safely.  Behavioral modification strategies: increasing lean protein intake, decreasing simple carbohydrates, increasing vegetables, increasing water intake, meal planning and cooking strategies, keeping healthy foods in the home, ways to avoid boredom eating, holiday eating strategies , and planning for success.  Richard [name] has agreed to follow-up with our clinic in 4 weeks. He was informed of the importance of frequent follow-up visits to maximize his success with intensive lifestyle modifications for his multiple health conditions.   Objective:   Blood  pressure 138/71, pulse 80, temperature 98.2 F (36.8 C), height 5' 7 (1.702 m), weight 230 lb (104.3 kg), SpO2 98%. Body mass index is 36.02 kg/m.  General: Cooperative, alert, well developed, in no acute distress. HEENT: Conjunctivae and lids unremarkable. Cardiovascular: Regular rhythm.  Lungs: Normal work of breathing. Neurologic: No focal deficits.   Lab Results  Component Value Date   CREATININE 0.94 01/05/2024   BUN 18 01/05/2024   NA 132 (L) 01/05/2024   K 4.1 01/05/2024   CL 94 (L) 01/05/2024   CO2 22 01/05/2024   Lab Results  Component Value Date   ALT 20 01/05/2024   AST 36 01/05/2024   ALKPHOS 90 01/05/2024   BILITOT 0.6 01/05/2024   Lab Results  Component Value Date   HGBA1C 5.8 (H) 08/17/2023    HGBA1C 5.9 07/27/2023   HGBA1C 6.2 03/26/2023   HGBA1C 6.1 (H) 09/29/2022   HGBA1C 6.0 (H) 12/09/2020   Lab Results  Component Value Date   INSULIN  4.9 08/17/2023   INSULIN  1.1 (L) 09/29/2022   INSULIN  12.3 07/24/2020   INSULIN  5.3 04/08/2020   Lab Results  Component Value Date   TSH 0.625 01/05/2024   Lab Results  Component Value Date   CHOL 159 07/27/2023   HDL 77 (A) 07/27/2023   LDLCALC 69 07/27/2023   TRIG 53 07/27/2023   CHOLHDL 2.8 07/26/2019   Lab Results  Component Value Date   VD25OH 68.8 08/17/2023   VD25OH 49 03/26/2023   VD25OH 78.7 09/29/2022   Lab Results  Component Value Date   WBC 4.5 01/05/2024   HGB 15.3 01/05/2024   HCT 46.7 01/05/2024   MCV 96 01/05/2024   PLT 234 01/05/2024   No results found for: IRON, TIBC, FERRITIN  Attestation Statements:   Reviewed by clinician on day of visit: allergies, medications, problem list, medical history, surgical history, family history, social history, and previous encounter notes.  Time spent on visit including pre-visit chart review and post-visit care and charting was 27 minutes.   I have reviewed the above documentation for accuracy and completeness, and I agree with the above. -  Janita Camberos d. Levante Simones, NP-C

## 2024-02-20 ENCOUNTER — Other Ambulatory Visit: Payer: Self-pay | Admitting: Cardiology

## 2024-02-21 ENCOUNTER — Other Ambulatory Visit: Payer: Self-pay

## 2024-02-22 ENCOUNTER — Other Ambulatory Visit (HOSPITAL_COMMUNITY): Payer: Self-pay

## 2024-02-22 ENCOUNTER — Other Ambulatory Visit: Payer: Self-pay | Admitting: Cardiology

## 2024-02-22 MED ORDER — CARVEDILOL 12.5 MG PO TABS
12.5000 mg | ORAL_TABLET | Freq: Two times a day (BID) | ORAL | 3 refills | Status: AC
  Filled 2024-02-22: qty 180, 90d supply, fill #0

## 2024-02-23 ENCOUNTER — Other Ambulatory Visit (HOSPITAL_COMMUNITY): Payer: Self-pay

## 2024-03-10 NOTE — Progress Notes (Signed)
 "   Mild Cognitive Impairment of uncertain or unknown etiology   Richard [name] is a very pleasant 79 y.o. RH [name] with a history of  COPD, prediabetes, OSA on BiPAP, CHF, HOH, B12 deficiency, extensive cardiac history, strong family history of Alzheimer's disease  and a diagnosis of MCI of unclear etiology per neuropsych evaluation June 2025  presenting today in follow-up for evaluation of memory loss. Patient is on donepezil  5 mg daily, tolerating well.. Patient was last seen on 09/03/2023. Memory is stable with MMSE today at 30/30.  While performance during this testing is good, Richard [name] has subjective complaints about some changes in his memory.  We discussed increasing donepezil  to 10 mg daily for better coverage, Richard [name] agrees to proceed. Patient is able to participate on ADLs and to drive without difficulties.  This patient is accompanied in the office by his wife  who supplements the history. Previous records as well as any outside records available were reviewed prior to todays visit  Increased donepezil  to 10 mg daily. Side effects were discussed  Recommend using the hearing aids in an effort to improve comprehension Continue to control mood as per PCP Recommend using CPAP for OSA Recommend good control of cardiovascular risk factors [place] up in 6 months      Discussed the use of AI scribe software for clinical note transcription with the patient, who gave verbal consent to proceed.  History of Present Illness Richard [name] is a 79 year old [name] who presents in follow-up for evaluation of memory loss Richard [name] is accompanied by his wife.  Richard [name] reports a gradual worsening of memory since his last visit in June, particularly with new information, conversations, and remembering names. Despite these changes, Richard [name] continues to engage in activities such as reading novels and doing crossword puzzles. Richard [name] maintains the habit of writing lists for shopping and remembering tasks. No disorientation at home or  in his neighborhood and no wandering tendencies are reported.Richard [name] has ongoing hearing loss and wax buildup, which Richard [name] manages by cleaning his ears quarterly.  Richard [name] has noticed an increase in irritability, describing moments of 'snappiness' and raising his voice more frequently. Richard [name] has not informed his doctor about these changes and is not currently using any strategies or medications to manage them. No hallucinations, paranoia, or seizures are reported.  His sleep is generally good, and Richard [name] uses a BiPAP machine for sleep apnea for about four to six hours nightly, though Richard [name] sometimes removes it due to discomfort. No significant appetite changes are noted, but Richard [name] has gained some weight recently. Richard [name] remains in charge of his medications and has not missed any doses.  Richard [name] underwent cataract surgery on his right eye in April, and his vision is stable, though Richard [name] reports increased tearing. Occasional knee pain due to arthritis is managed with  Asper cream. Richard [name] has not been very active lately, partly due to cooler weather affecting his ability to play golf.  Richard [name] has a history of prostate issues and takes Flomax , with no reported worsening of symptoms. Richard [name] lost his sense of smell following a past sinus procedure, with no recent changes. Richard [name] lives with his wife and drives short distances, though less frequently than before.         Neuropsych evaluation 08/09/2023, Dr. Richie Briefly, results suggested impairment surrounding encoding (i.e., learning) aspects of verbal memory. Isolated impairments were also exhibited across response inhibition and a task assessing visual discrimination. However, all other tasks assessing executive functioning and visuospatial  abilities respectively were appropriate. Relative to his previous evaluation in July 2023, mild improvement could be argued across processing speed, basic attention, and phonemic fluency. Outside of this, all other domains exhibited relative stability. No cognitive domain  exhibited appreciable decline. The etiology for mild dysfunction remains uncertain. Mr. [name] has documented hearing loss via formalized testing and has not yet been willing to utilize hearing aids. Ongoing hearing loss could certainly be a primary culprit for his isolated impairment learning novel verbal information. This, in combination with chronic medical ailments, especially variable BiPAP adherence and ailments cardiovascular in nature, superimposed on the normal aging process, would appear the most likely explanation based on current testing and stability over time. Despite trouble learning novel information, delayed retention was appropriate. Overall, memory performance combined with intact performances across other areas of cognitive functioning continues to not be suggestive of presently symptomatic Alzheimer's disease.      Initial Visit 03/27/21 The patient is seen in neurologic consultation at the request of Hillman Bare, MD for the evaluation of memory.  The patient is accompanied by his wife who supplements the history. This is a Richard y.o. year old RH  [name] who has had memory issues for about a while during the visit to their daughter in [place] , she noticed that there was a mild decline in his father's cognition.  Richard [name] was asking the same questions within minutes, and if confronted about it, Richard [name] would become defensive.  Richard [name] is less detail oriented than before.  Richard [name] denies repeating the same stories.  Sometimes, Richard [name] cannot find what Richard [name] is looking for when Richard [name] gets to a room.  His mood may be changing, every day Richard [name] does his wife has not been a good day, has not started well but when asked to elaborate, Richard [name] snaps Richard [name] continues to drive without any issues.  Richard [name] enjoys doing crossword puzzles and word finding.  Richard [name] sleeps well, except when Richard [name] has to get up to urinate which is between 3 and 4 times a night.  His wife states that Richard [name] tosses and turns and his hands move during the night, especially  than in the last 8 months, but Richard [name] denies having any vivid dreams.  Richard [name] snores even with CPAP.  Richard [name] denies any sleepwalking.  Richard [name] denies any hallucinations or paranoia.  No hygiene concerns.  His medications are in a pillbox, Richard [name] denies forgetting any doses.  His wife always did the finances.  His appetite is good, denies trouble swallowing.  Over the last 8 months, Richard [name] has been in and controlled diet, but Richard [name] discontinued all forms of sodium intake, which in turn turned to be provoking hyponatremia.  Richard [name] is all or nothing-his wife says .  Richard [name] cooks and denies leaving the stove on accidentally or forgetting common recipe.  Richard [name] denies any head injuries, falls, seizure, or history of headaches.  Richard [name] denies any vision changes, dizziness, focal numbness or tingling, unilateral weakness or tremors or anosmia.  No reported incontinence, retention, constipation or diarrhea.  Richard [name] denies any alcohol, or tobacco.  Family history strong for Alzheimer's disease in mother, and her 4 sisters.  Richard [name] is retired from [employer], Richard [name] retired 4 years ago       MRI brain 04/26/21, personally reviewed no acute intracranial abnormality. Mild for age signal changes.suggestive of chronic small vessel disease. 2. 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      Past Medical History:  Diagnosis Date   Acid reflux disease    Arthritis    Asthma 04/30/2013   Atherosclerosis of coronary artery 04/30/2013   Ventricular fibrillation; circumflex stent x2 in 2004   Benign essential hypertension    CAP (community acquired pneumonia) 04/30/2013   Chronic heart failure with preserved ejection fraction (HFpEF) 04/17/2022   Class 2 severe obesity with serious comorbidity and body mass index (BMI) of 39.0 to 39.9 in adult 06/04/2020   COPD, 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   Dyspnea on exertion 09/13/2017   Edema, lower extremity    Essential hypertension 12/02/2021    Excessive cerumen in both ear canals 01/05/2022   Fatigue 01/06/2022   Heart failure, diastolic    Hemorrhage of rectum and anus    History of colonic polyps    Hyperlipidemia 06/26/2013   Hypoxia 04/30/2013   Influenza due to identified novel influenza A virus with other respiratory manifestations 05/01/2013   Internal hemorrhoids    Mild cognitive impairment of uncertain or unknown etiology 09/30/2021   Myocardial infarction 2004   Dr. Claudene, at [place], yearly visits   OSA treated with BiPAP 04/24/2018   Mild OSA with an AHI of 13.5/hr and O2 sats of 88%. Now on BiPAP at 13/9cm H2O.    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   Prediabetes 04/09/2020   Pure hypercholesterolemia    Right bundle branch block 06/27/2014    EKG performed 06/27/2014   Transient total loss of muscle tone 01/06/2022   Vitamin D  deficiency      Past Surgical History:  Procedure Laterality Date   CARDIAC CATHETERIZATION  2004   with 2 stents   CARDIOVASCULAR STRESS TEST  2011   COLONOSCOPY     POLYPECTOMY  02/17/2011   Procedure: POLYPECTOMY NASAL;  Surgeon: Alm LITTIE Bouche;  Location: [facility] OR;  Service: ENT;  Laterality: N/A;   SINUS ENDO W/FUSION  02/17/2011   Procedure: ENDOSCOPIC SINUS SURGERY WITH FUSION NAVIGATION;  Surgeon: Alm LITTIE Bouche;  Location: [facility] OR;  Service: ENT;  Laterality: N/A;   TONSILLECTOMY     US  ECHOCARDIOGRAPHY           Objective:     PHYSICAL EXAMINATION:    VITALS:   Vitals:   03/14/24 1059 03/14/24 1132  BP: (!) 170/76 (!) 143/80  Pulse: 79   SpO2: 96%   Weight: 235 lb (106.6 kg)     GEN:  The patient appears stated age and is in NAD. HEENT:  Normocephalic, atraumatic.   Neurological examination:  General: NAD, well-groomed, appears stated age. Orientation: The patient is alert. Oriented to person, place and  to date.  Cranial nerves: There is good facial symmetry.The speech is fluent and clear. No aphasia or dysarthria. Fund of knowledge is  appropriate. Recent memory impaired and remote memory is normal.  Attention and concentration are normal.  Able to name objects and repeat phrases.  Hearing is reduced to conversational tone  .   Delayed recall  3/3 Sensation: Sensation is intact to light touch throughout Motor: Strength is at least antigravity x4. DTR's 2/4 in UE/LE      03/27/2021   10:00 AM  Montreal Cognitive Assessment   Visuospatial/ Executive (0/5) 2  Naming (0/3) 3  Attention: Read list of digits (0/2) 2  Attention: Read list of letters (0/1) 1  Attention: Serial 7 subtraction starting at 100 (0/3) 3  Language: Repeat phrase (0/2) 2  Language : Fluency (0/1) 1  Abstraction (0/2) 2  Delayed Recall (0/5) 4  Orientation (0/6) 6  Total 26       03/14/2024   12:00 PM 04/14/2023    2:00 PM 10/21/2022    8:00 AM  MMSE - Mini Mental State Exam  Orientation to time 5 4 5   Orientation to Place 5 5 5   Registration 3 3 3   Attention/ Calculation 5 5 5   Recall 3 2 3   Language- name 2 objects 2 2 2   Language- repeat 1 1 1   Language- follow 3 step command 3 3 3   Language- read & follow direction 1 1 1   Write a sentence 1 1 1   Copy design 1 1 0  Total score 30 28 29       Movement examination: Tone: There is normal tone in the UE/LE Abnormal movements:  no tremor.  No myoclonus.  No asterixis.   Coordination:  There is no decremation with RAM's. Normal finger to nose  Gait and Station: The patient has no difficulty arising out of a deep-seated chair without the use of the hands. The patient's stride length is good.  Gait is cautious and narrow. Decreased arm swing.  Thank you for allowing us  the opportunity to participate in the care of this nice patient. Please do not hesitate to contact us  for any questions or concerns.   Total time spent on today's visit was 31 minutes dedicated to this patient today, preparing to see patient, examining the patient, ordering tests and/or medications and counseling the patient,  documenting clinical information in the EHR or other health record, independently interpreting results and communicating results to the patient/family, discussing treatment and goals, answering patient's questions and coordinating care.  Cc:  Hillman Bare, MD  Camie Sevin 03/14/2024 12:34 PM      "

## 2024-03-14 ENCOUNTER — Other Ambulatory Visit (HOSPITAL_COMMUNITY): Payer: Self-pay

## 2024-03-14 ENCOUNTER — Encounter: Payer: Self-pay | Admitting: Physician Assistant

## 2024-03-14 ENCOUNTER — Ambulatory Visit: Admitting: Physician Assistant

## 2024-03-14 VITALS — BP 143/80 | HR 79 | Wt 235.0 lb

## 2024-03-14 DIAGNOSIS — G3184 Mild cognitive impairment, so stated: Secondary | ICD-10-CM

## 2024-03-14 MED ORDER — DONEPEZIL HCL 10 MG PO TABS
10.0000 mg | ORAL_TABLET | Freq: Every day | ORAL | 3 refills | Status: AC
  Filled 2024-03-14: qty 90, 90d supply, fill #0

## 2024-03-14 NOTE — Patient Instructions (Signed)
 It was a pleasure to see you today at our office.   Recommendations:  Follow up in  6 months  Increase donepezil10  mg daily.     Check hearing to improve comprehension Continue using the BiPAP  Whom to call:  Memory  decline, memory medications: Call our office [phone]          RECOMMENDATIONS FOR ALL PATIENTS WITH MEMORY PROBLEMS: 1. Continue to exercise (Recommend 30 minutes of walking everyday, or 3 hours every week) 2. Increase social interactions - continue going to [place] and enjoy social gatherings with friends and family 3. Eat healthy, avoid fried foods and eat more fruits and vegetables 4. Maintain adequate blood pressure, blood sugar, and blood cholesterol level. Reducing the risk of stroke and cardiovascular disease also helps promoting better memory. 5. Avoid stressful situations. Live a simple life and avoid aggravations. Organize your time and prepare for the next day in anticipation. 6. Sleep well, avoid any interruptions of sleep and avoid any distractions in the bedroom that may interfere with adequate sleep quality 7. Avoid sugar, avoid sweets as there is a strong link between excessive sugar intake, diabetes, and cognitive impairment We discussed the Mediterranean diet, which has been shown to help patients reduce the risk of progressive memory disorders and reduces cardiovascular risk. This includes eating fish, eat fruits and green leafy vegetables, nuts like almonds and hazelnuts, walnuts, and also use olive oil. Avoid fast foods and fried foods as much as possible. Avoid sweets and sugar as sugar use has been linked to worsening of memory function.  There is always a concern of gradual progression of memory problems. If this is the case, then we may need to adjust level of care according to patient needs. Support, both to the patient and caregiver, should then be put into place.    FALL PRECAUTIONS: Be cautious when walking. Scan the area for obstacles that  may increase the risk of trips and falls. When getting up in the mornings, sit up at the edge of the bed for a few minutes before getting out of bed. Consider elevating the bed at the head end to avoid drop of blood pressure when getting up. Walk always in a well-lit room (use night lights in the walls). Avoid area rugs or power cords from appliances in the middle of the walkways. Use a walker or a cane if necessary and consider physical therapy for balance exercise. Get your eyesight checked regularly.  FINANCIAL OVERSIGHT: Supervision, especially oversight when making financial decisions or transactions is also recommended.  HOME SAFETY: Consider the safety of the kitchen when operating appliances like stoves, microwave oven, and blender. Consider having supervision and share cooking responsibilities until no longer able to participate in those. Accidents with firearms and other hazards in the house should be identified and addressed as well.   ABILITY TO BE LEFT ALONE: If patient is unable to contact 911 operator, consider using LifeLine, or when the need is there, arrange for someone to stay with patients. Smoking is a fire hazard, consider supervision or cessation. Risk of wandering should be assessed by caregiver and if detected at any point, supervision and safe proof recommendations should be instituted.  MEDICATION SUPERVISION: Inability to self-administer medication needs to be constantly addressed. Implement a mechanism to ensure safe administration of the medications.   DRIVING: Regarding driving, in patients with progressive memory problems, driving will be impaired. We advise to have someone else do the driving if trouble finding directions or  if minor accidents are reported. Independent driving assessment is available to determine safety of driving.   If you are interested in the driving assessment, you can contact the following:  [facility] in [place]  [phone]  [facility] [phone]  [facility] [phone]  [facility] [phone] or [phone]

## 2024-03-16 ENCOUNTER — Encounter (INDEPENDENT_AMBULATORY_CARE_PROVIDER_SITE_OTHER): Payer: Self-pay | Admitting: Adult Health

## 2024-03-16 ENCOUNTER — Ambulatory Visit (INDEPENDENT_AMBULATORY_CARE_PROVIDER_SITE_OTHER): Admitting: Adult Health

## 2024-03-16 VITALS — BP 142/78 | HR 79 | Temp 98.1°F | Ht 67.0 in | Wt 232.0 lb

## 2024-03-16 DIAGNOSIS — Z6836 Body mass index (BMI) 36.0-36.9, adult: Secondary | ICD-10-CM | POA: Diagnosis not present

## 2024-03-16 DIAGNOSIS — E669 Obesity, unspecified: Secondary | ICD-10-CM

## 2024-03-16 DIAGNOSIS — E66812 Obesity, class 2: Secondary | ICD-10-CM

## 2024-03-16 DIAGNOSIS — I10 Essential (primary) hypertension: Secondary | ICD-10-CM | POA: Diagnosis not present

## 2024-03-16 DIAGNOSIS — G3184 Mild cognitive impairment, so stated: Secondary | ICD-10-CM | POA: Diagnosis not present

## 2024-03-16 NOTE — Progress Notes (Signed)
 "    WEIGHT SUMMARY AND BIOMETRICS  Vitals Temp: 98.1 F (36.7 C) BP: (!) 142/78 Pulse Rate: 79 SpO2: (!) 87 %   Anthropometric Measurements Height: 5' 7 (1.702 m) Weight: 232 lb (105.2 kg) BMI (Calculated): 36.33 Weight at Last Visit: 230lb Weight Lost Since Last Visit: 0lb Weight Gained Since Last Visit: 2lb Starting Weight: 258lb Total Weight Loss (lbs): 26 lb (11.8 kg)   Body Composition  Body Fat %: 34.4 % Fat Mass (lbs): 80.4 lbs Muscle Mass (lbs): 144.4 lbs Total Body Water (lbs): 98.4 lbs Visceral Fat Rating : 24   Other Clinical Data Fasting: No Labs: No Today's Visit #: 108 Starting Date: 04/08/20    Chief Complaint:   OBESITY Richard [name] is here to discuss his progress with his obesity treatment plan.  He is on the the Category 2 Plan and states he is following his eating plan approximately 60 % of the time.  He states he is exercising Golf 4 hours 1 times per week.  Interim History:  His BP was elevated at initial check today, settled down to 142/78 He was seen at Neurology earlier this week- also elevated BP readings. Per pt first SBP was > 170, recheck 143/80  Home readings: SBP: 130-140s DBP: 60-80s  He denies acute cardiac sx's at present or with any elevated reading  04/17/2022 ECHO- stable  Subjective:    Mild Cognitive Impairment 03/14/2024 Neurology OV Notes: Mild Cognitive Impairment of uncertain or unknown etiology    Richard [name] is a very pleasant 79 y.o. 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 Patient is on donepezil  5 mg daily, tolerating well.. Patient was last seen on 09/03/2023. Memory is stable with MMSE today at 30/30.  While performance during this testing is good, he  has subjective complaints about some changes in his memory.  We discussed increasing donepezil  to 10 mg daily for better coverage, he agrees to proceed. Patient is able to participate on ADLs and to drive without difficulties.  This patient is accompanied in the office by his wife  who supplements the history. Previous records as well as any outside records available were reviewed prior to todays visit   2. Essential hypertension His BP was elevated at initial check today, settled down to 142/78 He was seen at Neurology earlier this week- also elevated BP readings. 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 Side effects were discussed  Recommend using the hearing aids in an effort to improve comprehension Continue to control mood as per PCP Recommend using CPAP for OSA Recommend good control of cardiovascular risk factors [place] up in 6 months   2. Essential hypertension Continue healthy eating and remain active  3. Obesity, Current BMI 36.4 (Primary)  Richard [name] is  currently in the action stage of change. As such, his goal is to continue with weight loss efforts. He has agreed to the Category 2 Plan.   Exercise goals: Older adults should follow the adult guidelines. When older adults cannot meet the adult guidelines, they should be as physically active as their abilities and conditions will allow.  Older adults should do exercises that maintain or improve balance if they are at risk of falling.   Older adults should determine their level of effort for physical activity relative to their level of fitness.  Older adults with chronic conditions should understand whether and how their conditions affect their ability to do regular physical activity safely.  Behavioral modification strategies: increasing lean protein intake, decreasing simple carbohydrates, increasing vegetables, increasing water intake, no skipping meals, meal planning and cooking strategies, keeping healthy foods in the home, ways to avoid boredom eating, and planning for success.  Richard [name] has agreed to follow-up with our clinic in 4 weeks. He was informed of the importance of frequent follow-up visits to maximize his success with intensive lifestyle modifications for his multiple health conditions.   Objective:   Blood pressure (!) 142/78, pulse 79, temperature 98.1 F (36.7 C), height 5' 7 (1.702 m), weight 232 lb (105.2 kg), SpO2 (!) 87%. Body mass index is 36.34 kg/m.  General: Cooperative, alert, well developed, in no acute distress. HEENT: Conjunctivae and lids unremarkable. Cardiovascular: Regular rhythm.  Lungs: Normal work of breathing. Neurologic: No focal deficits.   Lab Results  Component Value Date   CREATININE 0.94 01/05/2024   BUN 18 01/05/2024   NA 132 (L) 01/05/2024   K 4.1 01/05/2024   CL 94 (L) 01/05/2024   CO2 22 01/05/2024   Lab Results  Component Value Date   ALT 20 01/05/2024   AST 36 01/05/2024   ALKPHOS 90 01/05/2024   BILITOT 0.6 01/05/2024   Lab Results  Component Value Date   HGBA1C 5.8 (H) 08/17/2023   HGBA1C 5.9 07/27/2023   HGBA1C 6.2 03/26/2023   HGBA1C 6.1 (H) 09/29/2022   HGBA1C 6.0 (H) 12/09/2020   Lab Results  Component Value Date   INSULIN  4.9 08/17/2023   INSULIN  1.1 (L) 09/29/2022   INSULIN  12.3 07/24/2020   INSULIN  5.3 04/08/2020   Lab Results  Component Value Date   TSH 0.625 01/05/2024   Lab Results  Component Value Date   CHOL 159 07/27/2023   HDL  77 (A) 07/27/2023   LDLCALC 69 07/27/2023   TRIG 53 07/27/2023   CHOLHDL 2.8 07/26/2019   Lab Results  Component Value Date   VD25OH 68.8 08/17/2023   VD25OH 49 03/26/2023   VD25OH 78.7 09/29/2022   Lab Results  Component Value Date   WBC 4.5 01/05/2024   HGB 15.3 01/05/2024   HCT 46.7 01/05/2024   MCV 96 01/05/2024   PLT 234 01/05/2024   No results found for: IRON, TIBC, FERRITIN  Attestation Statements:   Reviewed by clinician on day of visit: allergies, medications, problem list, medical history, surgical history, family history, social history, and previous encounter notes.  Time spent on visit including pre-visit chart review and post-visit care and charting was 27 minutes.   I have reviewed the above documentation for accuracy and completeness, and I agree with the above. -  Emalina Dubreuil d. Dhiren Azimi, NP-C "

## 2024-03-17 ENCOUNTER — Other Ambulatory Visit: Payer: Self-pay | Admitting: Cardiology

## 2024-03-20 ENCOUNTER — Other Ambulatory Visit (HOSPITAL_COMMUNITY): Payer: Self-pay

## 2024-03-20 MED ORDER — DAPAGLIFLOZIN PROPANEDIOL 10 MG PO TABS
10.0000 mg | ORAL_TABLET | Freq: Every day | ORAL | 3 refills | Status: AC
  Filled 2024-03-20: qty 90, 90d supply, fill #0

## 2024-03-23 ENCOUNTER — Other Ambulatory Visit: Payer: Self-pay

## 2024-03-23 ENCOUNTER — Other Ambulatory Visit (HOSPITAL_COMMUNITY): Payer: Self-pay

## 2024-03-23 MED ORDER — POTASSIUM CHLORIDE ER 10 MEQ PO CPCR: 10.0000 meq | ORAL_CAPSULE | Freq: Every day | ORAL | 4 refills | Status: AC

## 2024-03-23 MED ORDER — ATORVASTATIN CALCIUM 80 MG PO TABS: 80.0000 mg | ORAL_TABLET | Freq: Every day | ORAL | 4 refills | Status: AC

## 2024-03-23 MED ORDER — TAMSULOSIN HCL 0.4 MG PO CAPS
0.4000 mg | ORAL_CAPSULE | Freq: Every day | ORAL | 3 refills | Status: AC
  Filled 2024-03-23: qty 90, 90d supply, fill #0

## 2024-03-27 LAB — BASIC METABOLIC PANEL WITH GFR
CO2: 22 (ref 13–22)
Chloride: 94 — AB (ref 99–108)
Creatinine: 1 (ref 0.6–1.3)
Glucose: 108
Potassium: 3.8 meq/L (ref 3.5–5.1)

## 2024-03-27 LAB — HEPATIC FUNCTION PANEL
AST: 29 (ref 14–40)
Alkaline Phosphatase: 87 (ref 25–125)

## 2024-03-27 LAB — COMPREHENSIVE METABOLIC PANEL WITH GFR
Albumin: 4.8 (ref 3.5–5.0)
Calcium: 9.6 (ref 8.7–10.7)
Globulin: 2.4
eGFR: 74

## 2024-03-30 ENCOUNTER — Other Ambulatory Visit (HOSPITAL_COMMUNITY): Payer: Self-pay

## 2024-04-05 ENCOUNTER — Ambulatory Visit: Admitting: Podiatry

## 2024-04-05 DIAGNOSIS — L603 Nail dystrophy: Secondary | ICD-10-CM | POA: Diagnosis not present

## 2024-04-05 NOTE — Progress Notes (Unsigned)
 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

## 2024-04-06 ENCOUNTER — Other Ambulatory Visit (INDEPENDENT_AMBULATORY_CARE_PROVIDER_SITE_OTHER): Payer: Self-pay

## 2024-04-13 ENCOUNTER — Ambulatory Visit (INDEPENDENT_AMBULATORY_CARE_PROVIDER_SITE_OTHER): Admitting: Adult Health

## 2024-04-13 VITALS — BP 137/67 | HR 56 | Temp 98.2°F | Ht 67.0 in | Wt 230.0 lb

## 2024-04-13 DIAGNOSIS — E669 Obesity, unspecified: Secondary | ICD-10-CM | POA: Diagnosis not present

## 2024-04-13 DIAGNOSIS — G4733 Obstructive sleep apnea (adult) (pediatric): Secondary | ICD-10-CM

## 2024-04-13 DIAGNOSIS — Z6836 Body mass index (BMI) 36.0-36.9, adult: Secondary | ICD-10-CM

## 2024-04-13 DIAGNOSIS — Z6839 Body mass index (BMI) 39.0-39.9, adult: Secondary | ICD-10-CM

## 2024-04-13 DIAGNOSIS — R7303 Prediabetes: Secondary | ICD-10-CM
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
## 2024-05-11 ENCOUNTER — Ambulatory Visit (INDEPENDENT_AMBULATORY_CARE_PROVIDER_SITE_OTHER): Admitting: Internal Medicine

## 2024-09-12 ENCOUNTER — Ambulatory Visit: Payer: Self-pay | Admitting: Physician Assistant
